# Patient Record
Sex: Male | Born: 1965
Health system: Southern US, Community
[De-identification: ages and names within clinical notes are randomized; demographics above are authoritative.]

## PROBLEM LIST (undated history)

## (undated) DIAGNOSIS — I639 Cerebral infarction, unspecified: Secondary | ICD-10-CM

## (undated) DIAGNOSIS — R011 Cardiac murmur, unspecified: Secondary | ICD-10-CM

## (undated) HISTORY — DX: Cardiac murmur, unspecified: R01.1

## (undated) HISTORY — DX: Cerebral infarction, unspecified: I63.9

---

## 2011-04-18 ENCOUNTER — Ambulatory Visit: Payer: Self-pay

## 2011-04-18 DIAGNOSIS — M25519 Pain in unspecified shoulder: Secondary | ICD-10-CM

## 2011-04-18 DIAGNOSIS — M7150 Other bursitis, not elsewhere classified, unspecified site: Secondary | ICD-10-CM

## 2015-01-26 ENCOUNTER — Encounter: Payer: Self-pay | Admitting: Gastroenterology

## 2015-01-26 ENCOUNTER — Ambulatory Visit (INDEPENDENT_AMBULATORY_CARE_PROVIDER_SITE_OTHER): Payer: BLUE CROSS/BLUE SHIELD | Admitting: Family Medicine

## 2015-01-26 VITALS — BP 118/84 | HR 98 | Temp 98.1°F | Resp 18 | Ht 70.0 in | Wt 207.0 lb

## 2015-01-26 DIAGNOSIS — J209 Acute bronchitis, unspecified: Secondary | ICD-10-CM

## 2015-01-26 DIAGNOSIS — R1310 Dysphagia, unspecified: Secondary | ICD-10-CM

## 2015-01-26 DIAGNOSIS — Z9189 Other specified personal risk factors, not elsewhere classified: Secondary | ICD-10-CM | POA: Diagnosis not present

## 2015-01-26 MED ORDER — OMEPRAZOLE 20 MG PO CPDR: 20.0000 mg | DELAYED_RELEASE_CAPSULE | Freq: Every day | ORAL | Status: DC

## 2015-01-26 MED ORDER — HYDROCODONE-HOMATROPINE 5-1.5 MG/5ML PO SYRP: 5.0000 mL | ORAL_SOLUTION | Freq: Three times a day (TID) | ORAL | Status: DC | PRN

## 2015-01-26 MED ORDER — AZITHROMYCIN 250 MG PO TABS: ORAL_TABLET | ORAL | Status: DC

## 2015-01-26 NOTE — Patient Instructions (Signed)
You absolutely need to get your teeth and better working order. 2 great dentists that you might consider would be [facility] and [facility]  Also, the food getting stuck in your esophagus, can become a very debilitating problem. I am referring him to a gastroenterologist to get this taken care of. In the meantime, take the Prilosec every day.

## 2015-01-26 NOTE — Progress Notes (Signed)
Subjective:  This chart was scribed for John SidleKurt Maddisyn Hegwood MD, by Veverly FellsHatice Demirci,scribe, at [facility].  This patient was seen in room 8 and the patient's care was started at 8:44 AM.   Chief Complaint  Patient presents with  . Cough    x3 days green mucous      Patient ID: John SidleDavid G Breon, male    DOB: October 23, 1965, 49 y.o.   MRN: [id number]  HPI HPI Comments: John SidleDavid G Swayze is a 49 y.o. male who presents to the [facility] complaining of a productive cough (green mucous) onset three days ago.  Patient states his symptoms first started with a sore throat five days ago. He denies any nausea/vomitting.  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 Patient does not smoke and does not have a history of drug abuse.   He is also complaining of difficulty swallowing recently with an acid burning sensation at times.  He denies any difficulty breathing or shortness of breath but states that it is worrisome for him at times when he is eating.    There are no active problems to display for this patient.  Past Medical History  Diagnosis Date  . Heart murmur    History reviewed. No pertinent past surgical history. No Known Allergies Prior to Admission medications   Not on File   Social History   Social History  . Marital Status: Single    Spouse Name: N/A  . Number of Children: N/A  . Years of Education: N/A   Occupational History  . Not on file.   Social History Main Topics  . Smoking status: Never Smoker   . Smokeless tobacco: Not on file  . Alcohol Use: 6.0 oz/week    10 Standard drinks or equivalent per week  . Drug Use: No  . Sexual Activity: Not on file   Other Topics Concern  . Not on file   Social History Narrative  . No narrative on file       Review of Systems  Constitutional: Negative for fever and chills.  HENT: Positive for trouble swallowing.     Eyes: Negative for pain, redness and itching.  Respiratory: Positive for cough. Negative for choking and shortness of breath.   Gastrointestinal: Negative for nausea and vomiting.  Musculoskeletal: Negative for neck pain and neck stiffness.  Neurological: Negative for syncope and speech difficulty.       Objective:   Physical Exam  Constitutional: He is oriented to person, place, and time. He appears well-developed and well-nourished. No distress.  HENT:  Head: Normocephalic and atraumatic.  Multiple cavities, gingivitis   Eyes: Pupils are equal, round, and reactive to light.  Neck: Normal range of motion.  Pulmonary/Chest:  Rhonchi bilaterally.   Neurological: He is alert and oriented to person, place, and time.  Skin: Skin is warm and dry.    Filed Vitals:   01/26/15 0841  BP: 118/84  Pulse: 98  Temp: 98.1 F (36.7 C)  TempSrc: Oral  Resp: 18  Height: 5\' 10"  (1.778 m)  Weight: 207 lb (93.895 kg)  SpO2: 99%         Assessment & Plan:   This chart was scribed in my presence and reviewed by me personally.    ICD-9-CM ICD-10-CM   1. Acute bronchitis, unspecified organism 466.0 J20.9 azithromycin (ZITHROMAX) 250 MG tablet  HYDROcodone-homatropine (HYCODAN) 5-1.5 MG/5ML syrup  2. Poor dental hygiene 525.8 Z91.89   3. Dysphagia 787.20 R13.10 omeprazole (PRILOSEC) 20 MG capsule     Ambulatory referral to Gastroenterology   I have stressed to the patient the importance of good dentistry and seeing a gastroenterologist for his dysphagia.  Signed, John Sidle, MD

## 2015-02-20 ENCOUNTER — Ambulatory Visit: Payer: Self-pay | Admitting: Gastroenterology

## 2017-11-12 ENCOUNTER — Other Ambulatory Visit: Payer: Self-pay

## 2017-11-12 ENCOUNTER — Inpatient Hospital Stay (HOSPITAL_COMMUNITY): Payer: BLUE CROSS/BLUE SHIELD

## 2017-11-12 ENCOUNTER — Emergency Department (HOSPITAL_COMMUNITY): Payer: BLUE CROSS/BLUE SHIELD

## 2017-11-12 ENCOUNTER — Encounter (HOSPITAL_COMMUNITY): Payer: Self-pay

## 2017-11-12 ENCOUNTER — Inpatient Hospital Stay (HOSPITAL_COMMUNITY)
Admission: EM | Admit: 2017-11-12 | Discharge: 2017-11-14 | DRG: 065 | Disposition: A | Discharge status: Rehabilitation Facility | Payer: BLUE CROSS/BLUE SHIELD | Attending: Internal Medicine | Admitting: Internal Medicine

## 2017-11-12 DIAGNOSIS — I6789 Other cerebrovascular disease: Secondary | ICD-10-CM

## 2017-11-12 DIAGNOSIS — E785 Hyperlipidemia, unspecified: Secondary | ICD-10-CM | POA: Diagnosis not present

## 2017-11-12 DIAGNOSIS — R739 Hyperglycemia, unspecified: Secondary | ICD-10-CM

## 2017-11-12 DIAGNOSIS — R42 Dizziness and giddiness: Secondary | ICD-10-CM

## 2017-11-12 DIAGNOSIS — Z6832 Body mass index (BMI) 32.0-32.9, adult: Secondary | ICD-10-CM

## 2017-11-12 DIAGNOSIS — R112 Nausea with vomiting, unspecified: Secondary | ICD-10-CM | POA: Diagnosis not present

## 2017-11-12 DIAGNOSIS — D72829 Elevated white blood cell count, unspecified: Secondary | ICD-10-CM | POA: Diagnosis not present

## 2017-11-12 DIAGNOSIS — Q211 Atrial septal defect: Secondary | ICD-10-CM

## 2017-11-12 DIAGNOSIS — I10 Essential (primary) hypertension: Secondary | ICD-10-CM | POA: Diagnosis not present

## 2017-11-12 DIAGNOSIS — R262 Difficulty in walking, not elsewhere classified: Secondary | ICD-10-CM | POA: Diagnosis present

## 2017-11-12 DIAGNOSIS — R61 Generalized hyperhidrosis: Secondary | ICD-10-CM | POA: Diagnosis not present

## 2017-11-12 DIAGNOSIS — R29704 NIHSS score 4: Secondary | ICD-10-CM | POA: Diagnosis present

## 2017-11-12 DIAGNOSIS — I739 Peripheral vascular disease, unspecified: Secondary | ICD-10-CM | POA: Diagnosis not present

## 2017-11-12 DIAGNOSIS — I69393 Ataxia following cerebral infarction: Secondary | ICD-10-CM | POA: Diagnosis not present

## 2017-11-12 DIAGNOSIS — E669 Obesity, unspecified: Secondary | ICD-10-CM | POA: Diagnosis present

## 2017-11-12 DIAGNOSIS — Z809 Family history of malignant neoplasm, unspecified: Secondary | ICD-10-CM | POA: Diagnosis not present

## 2017-11-12 DIAGNOSIS — I63442 Cerebral infarction due to embolism of left cerebellar artery: Principal | ICD-10-CM | POA: Diagnosis present

## 2017-11-12 DIAGNOSIS — D72820 Lymphocytosis (symptomatic): Secondary | ICD-10-CM | POA: Diagnosis not present

## 2017-11-12 DIAGNOSIS — I340 Nonrheumatic mitral (valve) insufficiency: Secondary | ICD-10-CM | POA: Diagnosis not present

## 2017-11-12 DIAGNOSIS — Z8249 Family history of ischemic heart disease and other diseases of the circulatory system: Secondary | ICD-10-CM | POA: Diagnosis not present

## 2017-11-12 DIAGNOSIS — K760 Fatty (change of) liver, not elsewhere classified: Secondary | ICD-10-CM | POA: Diagnosis present

## 2017-11-12 DIAGNOSIS — G119 Hereditary ataxia, unspecified: Secondary | ICD-10-CM | POA: Diagnosis not present

## 2017-11-12 DIAGNOSIS — Z7289 Other problems related to lifestyle: Secondary | ICD-10-CM

## 2017-11-12 DIAGNOSIS — I639 Cerebral infarction, unspecified: Secondary | ICD-10-CM | POA: Diagnosis present

## 2017-11-12 DIAGNOSIS — G464 Cerebellar stroke syndrome: Secondary | ICD-10-CM | POA: Diagnosis not present

## 2017-11-12 DIAGNOSIS — I63542 Cerebral infarction due to unspecified occlusion or stenosis of left cerebellar artery: Secondary | ICD-10-CM | POA: Diagnosis not present

## 2017-11-12 DIAGNOSIS — R110 Nausea: Secondary | ICD-10-CM | POA: Diagnosis not present

## 2017-11-12 DIAGNOSIS — R1111 Vomiting without nausea: Secondary | ICD-10-CM | POA: Diagnosis not present

## 2017-11-12 DIAGNOSIS — Z789 Other specified health status: Secondary | ICD-10-CM

## 2017-11-12 DIAGNOSIS — K5900 Constipation, unspecified: Secondary | ICD-10-CM | POA: Diagnosis not present

## 2017-11-12 LAB — COMPREHENSIVE METABOLIC PANEL
ALBUMIN: 4.2 g/dL (ref 3.5–5.0)
ALK PHOS: 43 U/L (ref 38–126)
ALT: 26 U/L (ref 0–44)
ANION GAP: 10 (ref 5–15)
AST: 25 U/L (ref 15–41)
BILIRUBIN TOTAL: 1.9 mg/dL — AB (ref 0.3–1.2)
BUN: 15 mg/dL (ref 6–20)
CALCIUM: 8.9 mg/dL (ref 8.9–10.3)
CO2: 24 mmol/L (ref 22–32)
Chloride: 104 mmol/L (ref 98–111)
Creatinine, Ser: 0.96 mg/dL (ref 0.61–1.24)
GFR calc Af Amer: >60 mL/min (ref >60)
GFR calc non Af Amer: >60 mL/min (ref >60)
GLUCOSE: 146 mg/dL — AB (ref 70–99)
Potassium: 3.8 mmol/L (ref 3.5–5.1)
SODIUM: 138 mmol/L (ref 135–145)
TOTAL PROTEIN: 7.2 g/dL (ref 6.5–8.1)

## 2017-11-12 LAB — RAPID URINE DRUG SCREEN, HOSP PERFORMED
AMPHETAMINES: NOT DETECTED
BARBITURATES: NOT DETECTED
Benzodiazepines: NOT DETECTED
Cocaine: NOT DETECTED
TETRAHYDROCANNABINOL: NOT DETECTED

## 2017-11-12 LAB — ETHANOL: Alcohol, Ethyl (B): <10 mg/dL (ref <10)

## 2017-11-12 LAB — PROTIME-INR
INR: 0.96
PROTHROMBIN TIME: 12.7 s (ref 11.4–15.2)

## 2017-11-12 LAB — DIFFERENTIAL
Basophils Absolute: 0 10*3/uL (ref 0.0–0.1)
Basophils Relative: 0 %
EOS PCT: 0 %
Eosinophils Absolute: 0 10*3/uL (ref 0.0–0.7)
LYMPHS ABS: 0.7 10*3/uL (ref 0.7–4.0)
LYMPHS PCT: 6 %
Monocytes Absolute: 0.4 10*3/uL (ref 0.1–1.0)
Monocytes Relative: 3 %
NEUTROS ABS: 9.7 10*3/uL — AB (ref 1.7–7.7)
NEUTROS PCT: 91 %

## 2017-11-12 LAB — URINALYSIS, ROUTINE W REFLEX MICROSCOPIC
BACTERIA UA: NONE SEEN
BILIRUBIN URINE: NEGATIVE
Glucose, UA: 50 mg/dL — AB
Hgb urine dipstick: NEGATIVE
KETONES UR: NEGATIVE mg/dL
LEUKOCYTES UA: NEGATIVE
NITRITE: NEGATIVE
Protein, ur: 30 mg/dL — AB
SPECIFIC GRAVITY, URINE: 1.038 — AB (ref 1.005–1.030)
pH: 8 (ref 5.0–8.0)

## 2017-11-12 LAB — I-STAT CHEM 8, ED
BUN: 14 mg/dL (ref 6–20)
CALCIUM ION: 1.1 mmol/L — AB (ref 1.15–1.40)
CHLORIDE: 103 mmol/L (ref 98–111)
CREATININE: 0.9 mg/dL (ref 0.61–1.24)
Glucose, Bld: 142 mg/dL — ABNORMAL HIGH (ref 70–99)
HCT: 42 % (ref 39.0–52.0)
Hemoglobin: 14.3 g/dL (ref 13.0–17.0)
Potassium: 3.8 mmol/L (ref 3.5–5.1)
Sodium: 138 mmol/L (ref 135–145)
TCO2: 21 mmol/L — AB (ref 22–32)

## 2017-11-12 LAB — APTT: aPTT: 27 seconds (ref 24–36)

## 2017-11-12 LAB — I-STAT TROPONIN, ED: Troponin i, poc: 0 ng/mL (ref 0.00–0.08)

## 2017-11-12 LAB — CBC
HCT: 42.4 % (ref 39.0–52.0)
HEMOGLOBIN: 14.9 g/dL (ref 13.0–17.0)
MCH: 28.5 pg (ref 26.0–34.0)
MCHC: 35.1 g/dL (ref 30.0–36.0)
MCV: 81.2 fL (ref 78.0–100.0)
PLATELETS: 280 10*3/uL (ref 150–400)
RBC: 5.22 MIL/uL (ref 4.22–5.81)
RDW: 12.9 % (ref 11.5–15.5)
WBC: 10.8 10*3/uL — AB (ref 4.0–10.5)

## 2017-11-12 LAB — ECHOCARDIOGRAM COMPLETE
Height: 70 in
Weight: 3600 oz

## 2017-11-12 MED ORDER — ASPIRIN 300 MG RE SUPP: 300.0000 mg | Freq: Every day | RECTAL | Status: DC

## 2017-11-12 MED ORDER — SENNOSIDES-DOCUSATE SODIUM 8.6-50 MG PO TABS: 1.0000 | ORAL_TABLET | Freq: Every evening | ORAL | Status: DC | PRN

## 2017-11-12 MED ORDER — MECLIZINE HCL 25 MG PO TABS
25.0000 mg | ORAL_TABLET | Freq: Once | ORAL | Status: AC
  Administered 2017-11-12: 25 mg via ORAL
  Filled 2017-11-12: qty 1

## 2017-11-12 MED ORDER — ONDANSETRON HCL 4 MG/2ML IJ SOLN
4.0000 mg | Freq: Four times a day (QID) | INTRAMUSCULAR | Status: DC | PRN
  Administered 2017-11-12 (×2): 4 mg via INTRAVENOUS
  Filled 2017-11-12 (×2): qty 2

## 2017-11-12 MED ORDER — ACETAMINOPHEN 325 MG PO TABS: 650.0000 mg | ORAL_TABLET | ORAL | Status: DC | PRN

## 2017-11-12 MED ORDER — METOCLOPRAMIDE HCL 5 MG/ML IJ SOLN
10.0000 mg | Freq: Once | INTRAMUSCULAR | Status: AC
  Administered 2017-11-12: 10 mg via INTRAVENOUS
  Filled 2017-11-12: qty 2

## 2017-11-12 MED ORDER — IOPAMIDOL (ISOVUE-370) INJECTION 76%
100.0000 mL | Freq: Once | INTRAVENOUS | Status: AC | PRN
  Administered 2017-11-12: 80 mL via INTRAVENOUS

## 2017-11-12 MED ORDER — ENOXAPARIN SODIUM 40 MG/0.4ML ~~LOC~~ SOLN
40.0000 mg | SUBCUTANEOUS | Status: DC
  Administered 2017-11-12 – 2017-11-14 (×3): 40 mg via SUBCUTANEOUS
  Filled 2017-11-12 (×3): qty 0.4

## 2017-11-12 MED ORDER — SODIUM CHLORIDE 0.9 % IV SOLN
INTRAVENOUS | Status: AC
  Administered 2017-11-12: 12:00:00 via INTRAVENOUS

## 2017-11-12 MED ORDER — IOPAMIDOL (ISOVUE-370) INJECTION 76%
INTRAVENOUS | Status: AC
  Filled 2017-11-12: qty 100

## 2017-11-12 MED ORDER — STROKE: EARLY STAGES OF RECOVERY BOOK
Freq: Once | Status: AC
  Administered 2017-11-12: 14:00:00

## 2017-11-12 MED ORDER — ACETAMINOPHEN 650 MG RE SUPP: 650.0000 mg | RECTAL | Status: DC | PRN

## 2017-11-12 MED ORDER — ACETAMINOPHEN 160 MG/5ML PO SOLN: 650.0000 mg | ORAL | Status: DC | PRN

## 2017-11-12 MED ORDER — ASPIRIN 325 MG PO TABS
325.0000 mg | ORAL_TABLET | Freq: Every day | ORAL | Status: DC
  Administered 2017-11-12 – 2017-11-13 (×2): 325 mg via ORAL
  Filled 2017-11-12 (×2): qty 1

## 2017-11-12 NOTE — ED Provider Notes (Signed)
7:39 AM [facility] at shift change.   MRI demonstrates acute CVA.  Stroke order set pending.  I have spoken with Dr. Wilford CornerArora.  He requests that we go ahead and obtain CT angiography of the head and neck.  [facility] of [facility] team [facility].  Patient updated on results.  He is aware of the need for admission.  Symptoms stable.  No gross neurological deficits on exam.  BP 118/74 (BP Location: Right Arm)   Pulse 90   Temp 98 F (36.7 C) (Oral)   Resp 16   Ht 5\' 10"  (1.778 m)   Wt 102.1 kg   SpO2 95%   BMI 32.28 kg/m   8:21 AM Spoke with Dr. Marland McalpineSheikh who will see patient.   BP 123/79   Pulse 80   Temp 98 F (36.7 C) (Oral)   Resp 20   Ht 5\' 10"  (1.778 m)   Wt 102.1 kg   SpO2 99%   BMI 32.28 kg/m     Renne CriglerGeiple, Luisdavid Hamblin, PA-C 11/12/17 [id number]    Pricilla LovelessGoldston, Scott, MD 11/12/17 [phone]

## 2017-11-12 NOTE — ED Notes (Signed)
Patient aware urine sample is needed.  

## 2017-11-12 NOTE — Progress Notes (Signed)
*  PRELIMINARY RESULTS* Echocardiogram 2D Echocardiogram has been performed.  John Mcbride, [name] 11/12/2017, 1:27 PM

## 2017-11-12 NOTE — Progress Notes (Signed)
SLP Cancellation Note  Patient Details Name: John Mcbride MRN: [id number] DOB: 02/22/1966   Cancelled treatment:       Reason Eval/Treat Not Completed: Patient at procedure or test/unavailable. Unable to complete SLE at this time, as pt is off unit for testing. Will continue efforts.   John Mcbride, [facility] Hospital[facility]P, CCC-SLP Speech Language Pathologist [phone]  John Mcbride, John Mcbride 11/12/2017, 4:00 PM

## 2017-11-12 NOTE — Consult Note (Addendum)
Neurology Consultation  Reason for Consult: Left cerebellar infarct Referring Physician: [facility]  CC: Dizziness, nausea vomiting with inability to walk  History is obtained from: Patient and chart  HPI: John Mcbride is a 52 y.o. male with history of heart murmur and previous stroke.  Patient states that last night at approximately 11:00 she went to sleep.  He woke up at 12:00 and noticed he felt dizzy.  No vertigo, no numbness, no blurred vision, no double vision, no weakness.  Patient got up to go to the bathroom and noticed his dizziness could not allow him to walk.  He had to crawl to a chair.  He continued to feel dizzy.  States he did throw up once while at home and then multiple times when he was in the EMS vehicle.  While in the EMS truck he was given Zofran and nausea vomiting.  Patient was brought to [place] long and then transferred to [facility].  Currently he still feels dizzy and off-balance when he is walking but again states he does not have any vertigo.  He states that he does not take any Plavix nor does he take aspirin on a daily basis.  He denies drinking or smoking.   LKW: 2300 hrs. on 11/12/2017 tpa given?: no, out of window Premorbid modified Rankin scale (mRS): 0 NIHSS 4   ROS:ROS was performed and is negative except as noted in the HPI.  Past Medical History:  Diagnosis Date  . Heart murmur      Family History  Problem Relation Age of Onset  . Cancer Mother   . Cancer Father   . Heart disease Father      Social History:   reports that he has never smoked. He has never used smokeless tobacco. He reports that he drinks about 10.0 standard drinks of alcohol per week. He reports that he does not use drugs.  Medications  Current Facility-Administered Medications:  .   stroke: mapping our early stages of recovery book, , Does not apply, Once, Sheikh, Omair Latif, DO .  0.9 %  sodium chloride infusion, , Intravenous, Continuous, Sheikh, Omair Latif, DO .   acetaminophen (TYLENOL) tablet 650 mg, 650 mg, Oral, Q4H PRN **OR** acetaminophen (TYLENOL) solution 650 mg, 650 mg, Per Tube, Q4H PRN **OR** acetaminophen (TYLENOL) suppository 650 mg, 650 mg, Rectal, Q4H PRN, Sheikh, Omair Latif, DO .  aspirin suppository 300 mg, 300 mg, Rectal, Daily **OR** aspirin tablet 325 mg, 325 mg, Oral, Daily, Sheikh, Omair Latif, DO .  enoxaparin (LOVENOX) injection 40 mg, 40 mg, Subcutaneous, Q24H, Sheikh, Omair Latif, DO .  iopamidol (ISOVUE-370) 76 % injection, , , ,  .  ondansetron (ZOFRAN) injection 4 mg, 4 mg, Intravenous, Q6H PRN, Sheikh, Omair Latif, DO, 4 mg at 11/12/17 1010 .  senna-docusate (Senokot-S) tablet 1 tablet, 1 tablet, Oral, QHS PRN, Merlene Laughter, DO   Exam: Current vital signs: BP 118/81 (BP Location: Left Arm)   Pulse 68   Temp 98.1 F (36.7 C) (Oral)   Resp 16   Ht 5\' 10"  (1.778 m)   Wt 102.1 kg   SpO2 99%   BMI 32.28 kg/m  Vital signs in last 24 hours: Temp:  [98 F (36.7 C)-98.1 F (36.7 C)] 98.1 F (36.7 C) (08/14 1054) Pulse Rate:  [66-90] 68 (08/14 1054) Resp:  [16-22] 16 (08/14 1054) BP: (118-136)/(69-88) 118/81 (08/14 1054) SpO2:  [94 %-100 %] 99 % (08/14 1054) Weight:  [102.1 kg] 102.1 kg (08/14 0243)  GENERAL: Awake, alert in NAD Lungs: Clear Ext: warm, well perfused, intact peripheral pulses  NEURO:  Mental Status: AA&Ox3, speech appears dysarthric.  Naming, repetition, fluency, and comprehension intact. Cranial Nerves: PERRL 2 mm/brisk. EOMI, visual fields full, saccadic eye movements with vertical ocular nystagmus/bobbing, no facial asymmetry, facial sensation intact, hearing intact, tongue/uvula/soft palate midline, Motor: 5/5 throughout Tone: is normal and bulk is normal Sensation- Intact to light touch bilaterally Coordination: Finger-to-nose was dysmetric on the left, heel-to-shin was slightly dysmetric on the left Gait- deferred  Labs I have reviewed labs in epic and the results pertinent to this  consultation are:   CBC    Component Value Date/Time   WBC 10.8 (H) 11/12/2017 0728   RBC 5.22 11/12/2017 0728   HGB 14.3 11/12/2017 0738   HCT 42.0 11/12/2017 0738   PLT 280 11/12/2017 0728   MCV 81.2 11/12/2017 0728   MCH 28.5 11/12/2017 0728   MCHC 35.1 11/12/2017 0728   RDW 12.9 11/12/2017 0728   LYMPHSABS 0.7 11/12/2017 0728   MONOABS 0.4 11/12/2017 0728   EOSABS 0.0 11/12/2017 0728   BASOSABS 0.0 11/12/2017 0728    CMP     Component Value Date/Time   NA 138 11/12/2017 0738   K 3.8 11/12/2017 0738   CL 103 11/12/2017 0738   CO2 24 11/12/2017 0728   GLUCOSE 142 (H) 11/12/2017 0738   BUN 14 11/12/2017 0738   CREATININE 0.90 11/12/2017 0738   CALCIUM 8.9 11/12/2017 0728   PROT 7.2 11/12/2017 0728   ALBUMIN 4.2 11/12/2017 0728   AST 25 11/12/2017 0728   ALT 26 11/12/2017 0728   ALKPHOS 43 11/12/2017 0728   BILITOT 1.9 (H) 11/12/2017 0728   GFRNONAA >60 11/12/2017 0728   GFRAA >60 11/12/2017 0728    Lipid Panel  No results found for: CHOL, TRIG, HDL, CHOLHDL, VLDL, LDLCALC, LDLDIRECT   Imaging I have reviewed the images obtained: CT-scan of the brain: Suggestion of asymmetrical low-attenuation and possible mass effect in the left anterior cerebellum and cerebellar peduncle. MRI is recommended for further evaluation. No acute intracranial hemorrhage or mass effect.  CTA of head neck: IMPRESSION: The patient does not show evidence of atherosclerotic vascular disease in general or of dissection. 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 MRI examination of the brain  MRI Brain: IMPRESSION: 3.5 cm in diameter acute infarction in the superior cerebellum on the left. No evidence of significant swelling/mass effect or of any hemorrhage. Mild chronic small-vessel ischemic change of the cerebral hemispheric white matter. No large vessel vascular occlusion identified on this noncontrast study.   Assessment:  Left cerebellar infarct acute/subacute due to left SCA occlusion. There is no cerebral edema or shift at this time.   This point etiology unclear and under investigation.  Recommend #Check repeat CT of head at 9:00 PM for any evidence of hydrocephalus #Consider transfer to stepdown if mentation worsens or exam deteriorates. Consider stat CTH sooner than 9pm should that happen. #Transthoracic Echo, may need TEE and loop monitor  # Start patient on ASA 325mg  daily,   #Start or continue Atorvastatin 80 mg/other high intensity statin # BP goal: permissive HTN upto 220/120 mmHg # HBAIC and Lipid profile # Telemetry monitoring # Frequent neuro checks # NPO until passes stroke swallow screen   # please page stroke NP  Or  PA  Or MD from 8am -4 pm  as this patient from this time will be  followed by  the stroke.   You can look them up on [email or web address]  Password [email or web address]  Attending Neurohospitalist Addendum Patient seen and examined with APP/Resident. Agree with the history and physical as documented above. Agree with the plan as documented, which I helped formulate. I have independently reviewed the chart, obtained history, review of systems and examined the patient.I have personally reviewed pertinent head/neck/spine imaging (CT/MRI). Please feel free to call with any questions. --- Milon DikesAshish Kaleena Corrow, MD [facility] Pager: [phone]  If 7pm to 7am, please call on call as listed on AMION.

## 2017-11-12 NOTE — ED Notes (Signed)
Hospitalist at bedside 

## 2017-11-12 NOTE — ED Notes (Signed)
Patient transported to CT 

## 2017-11-12 NOTE — ED Provider Notes (Signed)
[facility]-EMERGENCY DEPT Provider Note   CSN: [id number] Arrival date & time: 11/12/17  0218     History   Chief Complaint Chief Complaint  Patient presents with  . Dizziness  . Nausea    HPI John Mcbride is a 52 y.o. male.  52 y/o male with hx of heart murmur presents to the ED for c/o dizziness. Patient was awoken from sleep by a dream tonight.  At this time, he began to notice persistent dizziness.  States the sensation felt similar to being on a boat.  Was unable to ambulate secondary to worsening symptoms, requiring him to crawl on his hands and knees.  Symptoms associated with nausea as well as vomiting.  Had a few episodes of emesis with EMS.  Notes that nausea is slightly improved since receiving Zofran.  Denies any headache, tinnitus, hearing loss, vision changes, difficulty speaking or swallowing, extremity numbness or paresthesias, extremity weakness.  Got a small amount of hydraulic fluid in his left ear canal yesterday while at work.  Is unsure if this may be contributing to his symptoms.  Denies prior history of vertigo.  No known history of hypertension, dyslipidemia, diabetes.  No recent head injury or trauma.  The patient is not a smoker.  Hx of heavy ETOH use, approx 4-6 beers 4 or 5 times/wk, but has been drinking less lately. Last consumed ETOH on Thursday.  The history is provided by the patient. No language interpreter was used.  Dizziness    Past Medical History:  Diagnosis Date  . Heart murmur     There are no active problems to display for this patient.   History reviewed. No pertinent surgical history.      Home Medications    Prior to Admission medications   Medication Sig Start Date End Date Taking? Authorizing Provider  azithromycin (ZITHROMAX) 250 MG tablet Take 2 tabs PO x 1 dose, then 1 tab PO QD x 4 days Patient not taking: Reported on 11/12/2017 01/26/15   John SidleLauenstein, Kurt, MD  HYDROcodone-homatropine [facility](HYCODAN)  5-1.5 MG/5ML syrup Take 5 mLs by mouth every 8 (eight) hours as needed for cough. Patient not taking: Reported on 11/12/2017 01/26/15   John SidleLauenstein, Kurt, MD  omeprazole (PRILOSEC) 20 MG capsule Take 1 capsule (20 mg total) by mouth daily. Patient not taking: Reported on 11/12/2017 01/26/15   John SidleLauenstein, Kurt, MD    Family History Family History  Problem Relation Age of Onset  . Cancer Mother   . Cancer Father   . Heart disease Father     Social History Social History   Tobacco Use  . Smoking status: Never Smoker  . Smokeless tobacco: Never Used  Substance Use Topics  . Alcohol use: Yes    Alcohol/week: 10.0 standard drinks    Types: 10 Standard drinks or equivalent per week  . Drug use: No     Allergies   Patient has no known allergies.   Review of Systems Review of Systems  Neurological: Positive for dizziness.  Ten systems reviewed and are negative for acute change, except as noted in the HPI.    Physical Exam Updated Vital Signs BP 118/74 (BP Location: Right Arm)   Pulse 90   Temp 98 F (36.7 C) (Oral)   Resp 16   Ht 5\' 10"  (1.778 m)   Wt 102.1 kg   SpO2 95%   BMI 32.28 kg/m   Physical Exam  Constitutional: He is oriented to person, place, and time. He  appears well-developed and well-nourished. No distress.  Nontoxic appearing and in NAD. Seems uncomfortable.  HENT:  Head: Normocephalic and atraumatic.  Right Ear: Tympanic membrane, external ear and ear canal normal.  Left Ear: Tympanic membrane, external ear and ear canal normal.  Eyes: Pupils are equal, round, and reactive to light. Conjunctivae and EOM are normal. No scleral icterus.  No appreciable nystagmus  Neck: Normal range of motion.  No meningismus  Cardiovascular: Normal rate, regular rhythm and intact distal pulses.  Pulmonary/Chest: Effort normal. No stridor. No respiratory distress.  Respirations even and unlabored  Musculoskeletal: Normal range of motion.  Neurological: He is alert and  oriented to person, place, and time. No cranial nerve deficit. He exhibits normal muscle tone. Coordination normal.  GCS 15. Speech is goal oriented. No cranial nerve deficits appreciated; symmetric eyebrow raise, no facial drooping, tongue midline. Patient has equal grip strength bilaterally with 5/5 strength against resistance in all major muscle groups bilaterally. Sensation to light touch intact. Patient moves extremities without ataxia.  Skin: Skin is warm and dry. No rash noted. He is not diaphoretic. No erythema. No pallor.  Psychiatric: He has a normal mood and affect. His behavior is normal.  Nursing note and vitals reviewed.    ED Treatments / Results  Labs (all labs ordered are listed, but only abnormal results are displayed) Labs Reviewed - No data to display  EKG None  Radiology Ct Head Wo Contrast  Result Date: 11/12/2017 CLINICAL DATA:  Dizziness. Nausea and vomiting. Hydraulic fluid in the ear at work. EXAM: CT HEAD WITHOUT CONTRAST TECHNIQUE: Contiguous axial images were obtained from the base of the skull through the vertex without intravenous contrast. COMPARISON:  None. FINDINGS: Brain: There is suggestion of asymmetrical low-attenuation and possible mass effect in the left anterior cerebellum and cerebellar peduncle. This could represent an area of acute infarct or possibly a mass lesion. MRI is suggested for further evaluation. Intracranial contents are otherwise unremarkable. No ventricular dilatation. No abnormal extra-axial fluid collections. Gray-white matter junctions are distinct. Basal cisterns are not effaced. No acute intracranial hemorrhage. Vascular: No hyperdense vessel or unexpected calcification. Skull: Normal. Negative for fracture or focal lesion. Sinuses/Orbits: Mild mucosal thickening in the paranasal sinuses. No acute air-fluid levels. Mastoid air cells and middle ear cavities are clear. Other: None. IMPRESSION: Suggestion of asymmetrical low-attenuation  and possible mass effect in the left anterior cerebellum and cerebellar peduncle. MRI is recommended for further evaluation. No acute intracranial hemorrhage or mass effect. Electronically Signed   By: Burman NievesWilliam  Stevens M.D.   On: 11/12/2017 05:42    Procedures Procedures (including critical care time)  Medications Ordered in ED Medications  metoCLOPramide (REGLAN) injection 10 mg (10 mg Intravenous Given 11/12/17 0311)  meclizine (ANTIVERT) tablet 25 mg (25 mg Oral Given 11/12/17 0311)     Initial Impression / Assessment and Plan / ED Course  I have reviewed the triage vital signs and the nursing notes.  Pertinent labs & imaging results that were available during my care of the patient were reviewed by me and considered in my medical decision making (see chart for details).     753:4310 AM 52 year old male with no significant past medical history presents to the emergency department for evaluation of vertigo.  Patient noticed symptoms after waking from a dream.  Initially had some nausea and vomiting.  This has improved with antiemetics.  No history of head injury or trauma.  Neurologic exam is largely reassuring.  We will continue to medically  manage and reassess.  4:35 AM Patient reassessed.  States that nausea has improved.  He notes persistent dizziness aggravated with position change.  Given persistent symptoms despite medications, will obtain CT imaging.  5:53 AM CT imaging today is abnormal and concerning for possible mass-effect in the left anterior cerebellum and cerebellar peduncle.  This may represent acute infarct or mass lesion.  No evidence of acute hemorrhage.  MRI recommended.  Will order.  Patient signed out to Rhea Bleacher, PA-C at shift change who will follow up on imaging and disposition appropriately.   Final Clinical Impressions(s) / ED Diagnoses   Final diagnoses:  Vertigo    ED Discharge Orders    None       Antony Madura, PA-C 11/12/17 0555    Palumbo,  April, MD 11/12/17 [phone]

## 2017-11-12 NOTE — ED Notes (Signed)
Patient states they do not feel like walking right now because they are still dizzy.

## 2017-11-12 NOTE — ED Notes (Signed)
ED TO INPATIENT HANDOFF REPORT  Name/Age/Gender John Mcbride 52 y.o. male  Code Status   Home/SNF/Other Home  Chief Complaint Dizziness; Nausea  Level of Care/Admitting Diagnosis ED Disposition    ED Disposition Condition [facility] Area: [place] [[id number]]  Level of Care: Telemetry [5]  Diagnosis: Cerebellar stroke [facility]) [[id number]]  Admitting Physician: Raiford Noble LATIF [[id number]]  Attending Physician: Raiford Noble LATIF [[id number]]  Estimated length of stay: past midnight tomorrow  Certification:: I certify this patient will need inpatient services for at least 2 midnights  PT Class (Do Not Modify): Inpatient [101]  PT Acc Code (Do Not Modify): Private [1]       Medical History Past Medical History:  Diagnosis Date  . Heart murmur     Allergies No Known Allergies  IV Location/Drains/Wounds Patient Lines/Drains/Airways Status   Active Line/Drains/Airways    Name:   Placement date:   Placement time:   Site:   Days:   Peripheral IV 11/12/17 Left Forearm   11/12/17    0241    Forearm   less than 1          Labs/Imaging Results for orders placed or performed during the hospital encounter of 11/12/17 (from the past 48 hour(s))  Ethanol     Status: None   Collection Time: 11/12/17  7:28 AM  Result Value Ref Range   Alcohol, Ethyl (B) <10 <10 mg/dL    Comment: (NOTE) Lowest detectable limit for serum alcohol is 10 mg/dL. For medical purposes only. Performed at [facility], [place] [address] [place], [place] [address]   Protime-INR     Status: None   Collection Time: 11/12/17  7:28 AM  Result Value Ref Range   Prothrombin Time 12.7 11.4 - 15.2 seconds   INR 0.96     Comment: Performed at [facility], [place] [address], [place], [place] [address]  APTT     Status: None   Collection Time: 11/12/17  7:28 AM  Result Value Ref Range   aPTT 27 24 - 36 seconds    Comment: Performed at  [facility], [place] [address], [place], [place] [address]  CBC     Status: Abnormal   Collection Time: 11/12/17  7:28 AM  Result Value Ref Range   WBC 10.8 (H) 4.0 - 10.5 K/uL   RBC 5.22 4.22 - 5.81 MIL/uL   Hemoglobin 14.9 13.0 - 17.0 g/dL   HCT 42.4 39.0 - 52.0 %   MCV 81.2 78.0 - 100.0 fL   MCH 28.5 26.0 - 34.0 pg   MCHC 35.1 30.0 - 36.0 g/dL   RDW 12.9 11.5 - 15.5 %   Platelets 280 150 - 400 K/uL    Comment: Performed at [facility], [place] [address], [place], [place] [address]  Differential     Status: Abnormal   Collection Time: 11/12/17  7:28 AM  Result Value Ref Range   Neutrophils Relative % 91 %   Neutro Abs 9.7 (H) 1.7 - 7.7 K/uL   Lymphocytes Relative 6 %   Lymphs Abs 0.7 0.7 - 4.0 K/uL   Monocytes Relative 3 %   Monocytes Absolute 0.4 0.1 - 1.0 K/uL   Eosinophils Relative 0 %   Eosinophils Absolute 0.0 0.0 - 0.7 K/uL   Basophils Relative 0 %   Basophils Absolute 0.0 0.0 - 0.1 K/uL    Comment: Performed at [facility], [address] [place], [place] [address]  Comprehensive metabolic panel     Status: Abnormal   Collection Time: 11/12/17  7:28 AM  Result Value Ref Range   Sodium 138 135 - 145 mmol/L   Potassium 3.8 3.5 - 5.1 mmol/L   Chloride 104 98 - 111 mmol/L   CO2 24 22 - 32 mmol/L   Glucose, Bld 146 (H) 70 - 99 mg/dL   BUN 15 6 - 20 mg/dL   Creatinine, Ser 0.96 0.61 - 1.24 mg/dL   Calcium 8.9 8.9 - 10.3 mg/dL   Total Protein 7.2 6.5 - 8.1 g/dL   Albumin 4.2 3.5 - 5.0 g/dL   AST 25 15 - 41 U/L   ALT 26 0 - 44 U/L   Alkaline Phosphatase 43 38 - 126 U/L   Total Bilirubin 1.9 (H) 0.3 - 1.2 mg/dL   GFR calc non Af Amer >60 >60 mL/min   GFR calc Af Amer >60 >60 mL/min    Comment: (NOTE) The eGFR has been calculated using the CKD EPI equation. This calculation has not been validated in all clinical situations. eGFR's persistently <60 mL/min signify possible Chronic Kidney Disease.    Anion gap 10  5 - 15    Comment: Performed at [facility], [place] [address] [place], [place] [address]  I-stat troponin, ED     Status: None   Collection Time: 11/12/17  7:36 AM  Result Value Ref Range   Troponin i, poc 0.00 0.00 - 0.08 ng/mL   Comment 3            Comment: Due to the release kinetics of cTnI, a negative result within the first hours of the onset of symptoms does not rule out myocardial infarction with certainty. If myocardial infarction is still suspected, repeat the test at appropriate intervals.   I-Stat Chem 8, ED     Status: Abnormal   Collection Time: 11/12/17  7:38 AM  Result Value Ref Range   Sodium 138 135 - 145 mmol/L   Potassium 3.8 3.5 - 5.1 mmol/L   Chloride 103 98 - 111 mmol/L   BUN 14 6 - 20 mg/dL   Creatinine, Ser 0.90 0.61 - 1.24 mg/dL   Glucose, Bld 142 (H) 70 - 99 mg/dL   Calcium, Ion 1.10 (L) 1.15 - 1.40 mmol/L   TCO2 21 (L) 22 - 32 mmol/L   Hemoglobin 14.3 13.0 - 17.0 g/dL   HCT 42.0 39.0 - 52.0 %   Ct Angio Head W Or Wo Contrast  Result Date: 11/12/2017 CLINICAL DATA:  Acute left cerebellar infarction. Dizziness, nausea and vomiting. EXAM: CT ANGIOGRAPHY HEAD AND NECK TECHNIQUE: Multidetector CT imaging of the head and neck was performed using the standard protocol during bolus administration of intravenous contrast. Multiplanar CT image reconstructions and MIPs were obtained to evaluate the vascular anatomy. Carotid stenosis measurements (when applicable) are obtained utilizing NASCET criteria, using the distal internal carotid diameter as the denominator. CONTRAST:  55m ISOVUE-370 IOPAMIDOL (ISOVUE-370) INJECTION 76% COMPARISON:  CT and MR study same day. FINDINGS: CTA NECK FINDINGS Aortic arch: Normal Right carotid system: Common carotid artery widely patent to the bifurcation. The carotid bifurcation is normal without soft or calcified plaque. Cervical internal carotid artery is tortuous but widely patent. Left carotid system: Common  carotid artery widely patent to the bifurcation. Carotid bifurcation is normal without soft or calcified plaque. Cervical ICA is tortuous but widely patent. Vertebral arteries: Both subclavian arteries appear normal. Both vertebral  artery origins are widely patent. The vertebral arteries are approximately equal in size an widely patent through the cervical region to the foramen magnum. Skeleton: Ordinary cervical spondylosis. Other neck: No mass or lymphadenopathy. Upper chest: Normal Review of the MIP images confirms the above findings CTA HEAD FINDINGS Anterior circulation: Both internal carotid arteries are widely patent through the skull base and siphon regions. The anterior and middle cerebral vessels are patent without proximal stenosis, aneurysm or vascular malformation. Posterior circulation: Both vertebral arteries are widely patent at and through the foramen magnum to the basilar. No basilar stenosis. There are diminutive posterior inferior cerebellar arteries. There are bilateral anterior inferior cerebellar arteries which are patent. Right superior cerebellar artery is normal. Left superior cerebellar artery is occluded just beyond its origin. Both posterior inferior cerebellar arteries are patent and normal. Venous sinuses: Patent and normal. Anatomic variants: None significant. Delayed phase: No abnormal enhancement. Review of the MIP images confirms the above findings IMPRESSION: The patient does not show evidence of atherosclerotic vascular disease in general or of dissection. 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 Electronically Signed   By: Nelson Chimes M.D.   On: 11/12/2017 08:58   Ct Head Wo Contrast  Result Date: 11/12/2017 CLINICAL DATA:  Dizziness. Nausea and vomiting. Hydraulic fluid in the ear at work. EXAM: CT HEAD WITHOUT CONTRAST TECHNIQUE: Contiguous axial images were obtained from the base of the skull through the vertex without  intravenous contrast. COMPARISON:  None. FINDINGS: Brain: There is suggestion of asymmetrical low-attenuation and possible mass effect in the left anterior cerebellum and cerebellar peduncle. This could represent an area of acute infarct or possibly a mass lesion. MRI is suggested for further evaluation. Intracranial contents are otherwise unremarkable. No ventricular dilatation. No abnormal extra-axial fluid collections. Gray-white matter junctions are distinct. Basal cisterns are not effaced. No acute intracranial hemorrhage. Vascular: No hyperdense vessel or unexpected calcification. Skull: Normal. Negative for fracture or focal lesion. Sinuses/Orbits: Mild mucosal thickening in the paranasal sinuses. No acute air-fluid levels. Mastoid air cells and middle ear cavities are clear. Other: None. IMPRESSION: Suggestion of asymmetrical low-attenuation and possible mass effect in the left anterior cerebellum and cerebellar peduncle. MRI is recommended for further evaluation. No acute intracranial hemorrhage or mass effect. Electronically Signed   By: Lucienne Capers M.D.   On: 11/12/2017 05:42   Ct Angio Neck W And/or Wo Contrast  Result Date: 11/12/2017 CLINICAL DATA:  Acute left cerebellar infarction. Dizziness, nausea and vomiting. EXAM: CT ANGIOGRAPHY HEAD AND NECK TECHNIQUE: Multidetector CT imaging of the head and neck was performed using the standard protocol during bolus administration of intravenous contrast. Multiplanar CT image reconstructions and MIPs were obtained to evaluate the vascular anatomy. Carotid stenosis measurements (when applicable) are obtained utilizing NASCET criteria, using the distal internal carotid diameter as the denominator. CONTRAST:  35m ISOVUE-370 IOPAMIDOL (ISOVUE-370) INJECTION 76% COMPARISON:  CT and MR study same day. FINDINGS: CTA NECK FINDINGS Aortic arch: Normal Right carotid system: Common carotid artery widely patent to the bifurcation. The carotid bifurcation is  normal without soft or calcified plaque. Cervical internal carotid artery is tortuous but widely patent. Left carotid system: Common carotid artery widely patent to the bifurcation. Carotid bifurcation is normal without soft or calcified plaque. Cervical ICA is tortuous but widely patent. Vertebral arteries: Both subclavian arteries appear normal. Both vertebral artery origins are widely patent. The vertebral arteries are approximately equal in size an widely patent through the cervical  region to the foramen magnum. Skeleton: Ordinary cervical spondylosis. Other neck: No mass or lymphadenopathy. Upper chest: Normal Review of the MIP images confirms the above findings CTA HEAD FINDINGS Anterior circulation: Both internal carotid arteries are widely patent through the skull base and siphon regions. The anterior and middle cerebral vessels are patent without proximal stenosis, aneurysm or vascular malformation. Posterior circulation: Both vertebral arteries are widely patent at and through the foramen magnum to the basilar. No basilar stenosis. There are diminutive posterior inferior cerebellar arteries. There are bilateral anterior inferior cerebellar arteries which are patent. Right superior cerebellar artery is normal. Left superior cerebellar artery is occluded just beyond its origin. Both posterior inferior cerebellar arteries are patent and normal. Venous sinuses: Patent and normal. Anatomic variants: None significant. Delayed phase: No abnormal enhancement. Review of the MIP images confirms the above findings IMPRESSION: The patient does not show evidence of atherosclerotic vascular disease in general or of dissection. 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 Electronically Signed   By: Nelson Chimes M.D.   On: 11/12/2017 08:58   Mr Brain Wo Contrast  Result Date: 11/12/2017 CLINICAL DATA:  Acute presentation with dizziness, nausea and vomiting. Hydraulic  fluid in the ear at work. EXAM: MRI HEAD WITHOUT CONTRAST TECHNIQUE: Multiplanar, multiecho pulse sequences of the brain and surrounding structures were obtained without intravenous contrast. COMPARISON:  CT same day FINDINGS: Brain: 3.5 cm in diameter acute infarction affecting the superior cerebellum on the left. No other acute infarction. No significant mass effect. No sign of hemorrhage. No abnormal finding affecting the brainstem. Cerebral hemispheres show scattered foci of T2 and FLAIR signal in the deep white matter consistent with early small vessel ischemic change of a chronic nature. No mass lesion, hydrocephalus or extra-axial collection. Vascular: Major vessels at the base of the brain show flow. Skull and upper cervical spine: Normal Sinuses/Orbits: Minimal mucosal inflammatory changes of the left maxillary sinus. Other: None IMPRESSION: 3.5 cm in diameter acute infarction in the superior cerebellum on the left. No evidence of significant swelling/mass effect or of any hemorrhage. Mild chronic small-vessel ischemic change of the cerebral hemispheric white matter. No large vessel vascular occlusion identified on this noncontrast study. Electronically Signed   By: Nelson Chimes M.D.   On: 11/12/2017 07:09    Pending Labs Unresulted Labs (From admission, onward)    Start     Ordered   11/12/17 1324  Urine rapid drug screen (hosp performed)  STAT,   STAT     11/12/17 0657   11/12/17 0658  Urinalysis, Routine w reflex microscopic  STAT,   STAT     11/12/17 0657   Signed and Held  HIV antibody (Routine Testing)  Once,   R     Signed and Held   Signed and Held  Hemoglobin A1c  Tomorrow morning,   R     Signed and Held   Signed and Held  Lipid panel  Tomorrow morning,   R    Comments:  Fasting    Signed and Held   Signed and Held  Creatinine, serum  (enoxaparin (LOVENOX)    CrCl >/= 30 ml/min)  Weekly,   R    Comments:  while on enoxaparin therapy    Signed and Held           Vitals/Pain Today's Vitals   11/12/17 0800 11/12/17 0915 11/12/17 0923 11/12/17 0925  BP: 119/70  123/69   Pulse: 69 82  87 85  Resp: _0 Temp:      TempSrc:      SpO2: 96% 98% 94% 100%  Weight:      Height:      PainSc:        Isolation Precautions No active isolations  Medications Medications  iopamidol (ISOVUE-370) 76 % injection (has no administration in time range)  ondansetron (ZOFRAN) injection 4 mg (has no administration in time range)  metoCLOPramide (REGLAN) injection 10 mg (10 mg Intravenous Given 11/12/17 0311)  meclizine (ANTIVERT) tablet 25 mg (25 mg Oral Given 11/12/17 0311)  iopamidol (ISOVUE-370) 76 % injection 100 mL (80 mLs Intravenous Contrast Given 11/12/17 0818)    Mobility walks with person assist

## 2017-11-12 NOTE — Progress Notes (Signed)
.  Rehab Admissions Coordinator Note:  Per PT and OT recommendation, Patient was screened by John Mcbride for appropriateness for an Inpatient Acute Rehab Consult.  At this time, we are recommending Inpatient Rehab consult. AC will contact MD for IP Rehab Consult Order.   John Mcbride 11/12/2017, 2:41 PM  I can be reached at [phone].

## 2017-11-12 NOTE — ED Notes (Signed)
Phone report called to WaverlyShaun, [facility] fundraiserN at [facility].

## 2017-11-12 NOTE — ED Triage Notes (Addendum)
Pt was brought by [facility] due to complaints of dizziness and N/V. Pt had a few emesis episodes with EMS. 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 Pt has also recently cut back from alcohol the past few weeks.   20 G L forearm.  Zofran 4 mg.

## 2017-11-12 NOTE — ED Notes (Signed)
[facility] called for transportation.

## 2017-11-12 NOTE — Consult Note (Signed)
Physical Medicine and Rehabilitation Consult Reason for Consult: Decreased functional mobility with dizziness Referring Physician: [facility]   HPI: John Mcbride is a 52 y.o. right-handed male with history of heart murmur, alcohol use on no prescription medications.  Patient lives with roommates.  Independent prior to admission.  Works Arts administrator.  One level home.  He does have parents in the area that can check on him as needed as well as good support of roommates.  Presented 11/12/2017 with nausea vomiting as well as dizziness and incoordination.  Cranial CT scan showed suggestion of asymmetrical low attenuation and possible mass-effect in the left anterior cerebellum and cerebellar peduncle.  MRI showed 3.5 cm diameter acute infarction in the superior cerebellum on the left.  No evidence of mass-effect or hemorrhage.  CT angiogram of head and neck with no large vessel occlusion.  Patient did not receive TPA.  Echocardiogram with ejection fraction of 65% no wall motion abnormalities.  Neurology follow-up currently on aspirin for CVA prophylaxis.  Subcutaneous Lovenox for DVT prophylaxis.  Await plan for possible TEE.  Therapy evaluations completed with recommendations of physical medicine rehab consult.   Review of Systems  Constitutional: Negative for chills and fever.  HENT: Negative for hearing loss.   Eyes: Negative for blurred vision and double vision.  Respiratory: Negative for cough and shortness of breath.   Cardiovascular: Negative for chest pain, palpitations and leg swelling.  Gastrointestinal: Positive for constipation, nausea and vomiting.  Genitourinary: Negative for dysuria, flank pain and hematuria.  Skin: Negative for rash.  Neurological: Positive for dizziness. Negative for seizures.  All other systems reviewed and are negative.  Past Medical History:  Diagnosis Date  . Heart murmur    History reviewed. No pertinent surgical history. Family History    Problem Relation Age of Onset  . Cancer Mother   . Cancer Father   . Heart disease Father    Social History:  reports that he has never smoked. He has never used smokeless tobacco. He reports that he drinks about 10.0 standard drinks of alcohol per week. He reports that he does not use drugs. Allergies: No Known Allergies No medications prior to admission.    Home: Home Living Family/patient expects to be discharged to:: Private residence Living Arrangements: Other (Comment) Available Help at Discharge: Family, Friend(s), Available PRN/intermittently Type of Home: House Home Access: Stairs to enter Secretary/administrator of Steps: 3 Entrance Stairs-Rails: Left Home Layout: One level Bathroom Shower/Tub: Engineer, manufacturing systems: Standard Home Equipment: None Additional Comments: lives with best friend "Jonny Ruiz" - mom/ dad live down the street and are retired. 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 Status:  Mobility: Bed Mobility Overal bed mobility: Needs Assistance Bed Mobility: Supine to Sit Supine to sit: Min guard General bed mobility comments: for safety and upright balance - increased dizziness with movement Transfers Overall transfer level: Needs assistance Equipment used: 2 person hand held assist Transfers: Sit to/from Stand Sit to Stand: Min assist, +2 physical assistance General transfer comment: for safety and stability - patient wanting to reach forward to sink, with verbal cueing required to push up from bedside Ambulation/Gait Ambulation/Gait assistance: Min assist, Mod assist, +2 physical assistance Gait Distance (Feet): 20 Feet Assistive device: 2 person hand held assist Gait Pattern/deviations: Step-to pattern, Step-through pattern, Decreased stride length, Staggering left, Staggering right General Gait Details: very unsteady;  weight bearing primarily thorugh L UE; increased dizziness with activity; very unsteady - unable to navigate straight path Gait velocity: decreased    ADL: ADL Overall ADL's : Needs assistance/impaired Eating/Feeding: Set up, Sitting Eating/Feeding Details (indicate cue type and reason): pt eating with R hand only with packages pre opened Grooming: Wash/dry hands, Standing, Moderate assistance Grooming Details (indicate cue type and reason): requires total+2 person (A) for balance and progress to leaning on sink for mod (A) Upper Body Bathing: Minimal assistance Lower Body Bathing: Minimal assistance Lower Body Dressing: Moderate assistance Lower Body Dressing Details (indicate cue type and reason): pt able to don socks supine and shoes by pushing feet into shoes. pt able to tie shoes with incr time and very dizzy after leaning over Toilet Transfer: +2 for physical assistance, +2 for safety/equipment, Moderate assistance Functional mobility during ADLs: +2 for physical assistance, +2 for safety/equipment, Minimal assistance General ADL Comments: pt requires (A) for basic transfer +2 (A)  Cognition: Cognition Overall Cognitive Status: Within Functional Limits for tasks assessed Cognition Arousal/Alertness: Awake/alert Behavior During Therapy: WFL for tasks assessed/performed Overall Cognitive Status: Within Functional Limits for tasks assessed  Blood pressure 118/81, pulse 68, temperature 98.1 F (36.7 C), temperature source Oral, resp. rate 16, height 5\' 10"  (1.778 m), weight 102.1 kg, SpO2 99 %. Physical Exam  Vitals reviewed. Constitutional: He is oriented to person, place, and time. He appears well-developed.  HENT:  Head: Normocephalic.  Eyes: EOM are normal. Right eye exhibits no discharge. Left eye exhibits no discharge.  Neck: Normal range of motion. Neck supple. No thyromegaly present.  Cardiovascular: Normal rate, regular rhythm and normal heart sounds.  Respiratory:  Effort normal and breath sounds normal. No respiratory distress.  GI: Soft. Bowel sounds are normal. He exhibits no distension.  Neurological: He is alert and oriented to person, place, and time.  Mood is a bit flat but appropriate.  Follows full commands. Ataxia LUE and LLE.   Skin: Skin is warm and dry.    Results for orders placed or performed during the hospital encounter of 11/12/17 (from the past 24 hour(s))  Urine rapid drug screen (hosp performed)     Status: Abnormal   Collection Time: 11/12/17  6:58 AM  Result Value Ref Range   Opiates (A) NONE DETECTED    Result not available. Reagent lot number recalled by manufacturer.   Cocaine NONE DETECTED NONE DETECTED   Benzodiazepines NONE DETECTED NONE DETECTED   Amphetamines NONE DETECTED NONE DETECTED   Tetrahydrocannabinol NONE DETECTED NONE DETECTED   Barbiturates NONE DETECTED NONE DETECTED  Urinalysis, Routine w reflex microscopic     Status: Abnormal   Collection Time: 11/12/17  6:58 AM  Result Value Ref Range   Color, Urine YELLOW YELLOW   APPearance CLEAR CLEAR   Specific Gravity, Urine 1.038 (H) 1.005 - 1.030   pH 8.0 5.0 - 8.0   Glucose, UA 50 (A) NEGATIVE mg/dL   Hgb urine dipstick NEGATIVE NEGATIVE   Bilirubin Urine NEGATIVE NEGATIVE   Ketones, ur NEGATIVE NEGATIVE mg/dL   Protein, ur 30 (A) NEGATIVE mg/dL   Nitrite NEGATIVE NEGATIVE   Leukocytes, UA NEGATIVE NEGATIVE   RBC / HPF 0-5 0 - 5 RBC/hpf   WBC, UA 0-5 0 - 5 WBC/hpf   Bacteria, UA NONE SEEN NONE SEEN   Mucus PRESENT   Ethanol     Status: None   Collection Time: 11/12/17  7:28 AM  Result Value Ref Range   Alcohol, Ethyl (B) <10 <  10 mg/dL  Protime-INR     Status: None   Collection Time: 11/12/17  7:28 AM  Result Value Ref Range   Prothrombin Time 12.7 11.4 - 15.2 seconds   INR 0.96   APTT     Status: None   Collection Time: 11/12/17  7:28 AM  Result Value Ref Range   aPTT 27 24 - 36 seconds  CBC     Status: Abnormal   Collection Time: 11/12/17   7:28 AM  Result Value Ref Range   WBC 10.8 (H) 4.0 - 10.5 K/uL   RBC 5.22 4.22 - 5.81 MIL/uL   Hemoglobin 14.9 13.0 - 17.0 g/dL   HCT 16.1 09.6 - 04.5 %   MCV 81.2 78.0 - 100.0 fL   MCH 28.5 26.0 - 34.0 pg   MCHC 35.1 30.0 - 36.0 g/dL   RDW 40.9 81.1 - 91.4 %   Platelets 280 150 - 400 K/uL  Differential     Status: Abnormal   Collection Time: 11/12/17  7:28 AM  Result Value Ref Range   Neutrophils Relative % 91 %   Neutro Abs 9.7 (H) 1.7 - 7.7 K/uL   Lymphocytes Relative 6 %   Lymphs Abs 0.7 0.7 - 4.0 K/uL   Monocytes Relative 3 %   Monocytes Absolute 0.4 0.1 - 1.0 K/uL   Eosinophils Relative 0 %   Eosinophils Absolute 0.0 0.0 - 0.7 K/uL   Basophils Relative 0 %   Basophils Absolute 0.0 0.0 - 0.1 K/uL  Comprehensive metabolic panel     Status: Abnormal   Collection Time: 11/12/17  7:28 AM  Result Value Ref Range   Sodium 138 135 - 145 mmol/L   Potassium 3.8 3.5 - 5.1 mmol/L   Chloride 104 98 - 111 mmol/L   CO2 24 22 - 32 mmol/L   Glucose, Bld 146 (H) 70 - 99 mg/dL   BUN 15 6 - 20 mg/dL   Creatinine, Ser 7.82 0.61 - 1.24 mg/dL   Calcium 8.9 8.9 - 95.6 mg/dL   Total Protein 7.2 6.5 - 8.1 g/dL   Albumin 4.2 3.5 - 5.0 g/dL   AST 25 15 - 41 U/L   ALT 26 0 - 44 U/L   Alkaline Phosphatase 43 38 - 126 U/L   Total Bilirubin 1.9 (H) 0.3 - 1.2 mg/dL   GFR calc non Af Amer >60 >60 mL/min   GFR calc Af Amer >60 >60 mL/min   Anion gap 10 5 - 15  I-stat troponin, ED     Status: None   Collection Time: 11/12/17  7:36 AM  Result Value Ref Range   Troponin i, poc 0.00 0.00 - 0.08 ng/mL   Comment 3          I-Stat Chem 8, ED     Status: Abnormal   Collection Time: 11/12/17  7:38 AM  Result Value Ref Range   Sodium 138 135 - 145 mmol/L   Potassium 3.8 3.5 - 5.1 mmol/L   Chloride 103 98 - 111 mmol/L   BUN 14 6 - 20 mg/dL   Creatinine, Ser 2.13 0.61 - 1.24 mg/dL   Glucose, Bld 086 (H) 70 - 99 mg/dL   Calcium, Ion 5.78 (L) 1.15 - 1.40 mmol/L   TCO2 21 (L) 22 - 32 mmol/L    Hemoglobin 14.3 13.0 - 17.0 g/dL   HCT 46.9 62.9 - 52.8 %   Ct Angio Head W Or Wo Contrast  Result Date: 11/12/2017 CLINICAL DATA:  Acute left cerebellar infarction. Dizziness, nausea and vomiting. EXAM: CT ANGIOGRAPHY HEAD AND NECK TECHNIQUE: Multidetector CT imaging of the head and neck was performed using the standard protocol during bolus administration of intravenous contrast. Multiplanar CT image reconstructions and MIPs were obtained to evaluate the vascular anatomy. Carotid stenosis measurements (when applicable) are obtained utilizing NASCET criteria, using the distal internal carotid diameter as the denominator. CONTRAST:  80mL ISOVUE-370 IOPAMIDOL (ISOVUE-370) INJECTION 76% COMPARISON:  CT and MR study same day. FINDINGS: CTA NECK FINDINGS Aortic arch: Normal Right carotid system: Common carotid artery widely patent to the bifurcation. The carotid bifurcation is normal without soft or calcified plaque. Cervical internal carotid artery is tortuous but widely patent. Left carotid system: Common carotid artery widely patent to the bifurcation. Carotid bifurcation is normal without soft or calcified plaque. Cervical ICA is tortuous but widely patent. Vertebral arteries: Both subclavian arteries appear normal. Both vertebral artery origins are widely patent. The vertebral arteries are approximately equal in size an widely patent through the cervical region to the foramen magnum. Skeleton: Ordinary cervical spondylosis. Other neck: No mass or lymphadenopathy. Upper chest: Normal Review of the MIP images confirms the above findings CTA HEAD FINDINGS Anterior circulation: Both internal carotid arteries are widely patent through the skull base and siphon regions. The anterior and middle cerebral vessels are patent without proximal stenosis, aneurysm or vascular malformation. Posterior circulation: Both vertebral arteries are widely patent at and through the foramen magnum to the basilar. No basilar stenosis.  There are diminutive posterior inferior cerebellar arteries. There are bilateral anterior inferior cerebellar arteries which are patent. Right superior cerebellar artery is normal. Left superior cerebellar artery is occluded just beyond its origin. Both posterior inferior cerebellar arteries are patent and normal. Venous sinuses: Patent and normal. Anatomic variants: None significant. Delayed phase: No abnormal enhancement. Review of the MIP images confirms the above findings IMPRESSION: The patient does not show evidence of atherosclerotic vascular disease in general or of dissection. 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 Electronically Signed   By: Paulina Fusi M.D.   On: 11/12/2017 08:58   Ct Head Wo Contrast  Result Date: 11/12/2017 CLINICAL DATA:  Dizziness. Nausea and vomiting. Hydraulic fluid in the ear at work. EXAM: CT HEAD WITHOUT CONTRAST TECHNIQUE: Contiguous axial images were obtained from the base of the skull through the vertex without intravenous contrast. COMPARISON:  None. FINDINGS: Brain: There is suggestion of asymmetrical low-attenuation and possible mass effect in the left anterior cerebellum and cerebellar peduncle. This could represent an area of acute infarct or possibly a mass lesion. MRI is suggested for further evaluation. Intracranial contents are otherwise unremarkable. No ventricular dilatation. No abnormal extra-axial fluid collections. Gray-white matter junctions are distinct. Basal cisterns are not effaced. No acute intracranial hemorrhage. Vascular: No hyperdense vessel or unexpected calcification. Skull: Normal. Negative for fracture or focal lesion. Sinuses/Orbits: Mild mucosal thickening in the paranasal sinuses. No acute air-fluid levels. Mastoid air cells and middle ear cavities are clear. Other: None. IMPRESSION: Suggestion of asymmetrical low-attenuation and possible mass effect in the left anterior cerebellum and  cerebellar peduncle. MRI is recommended for further evaluation. No acute intracranial hemorrhage or mass effect. Electronically Signed   By: Burman Nieves M.D.   On: 11/12/2017 05:42   Ct Angio Neck W And/or Wo Contrast  Result Date: 11/12/2017 CLINICAL DATA:  Acute left cerebellar infarction. Dizziness, nausea and vomiting. EXAM: CT ANGIOGRAPHY HEAD AND NECK TECHNIQUE: Multidetector CT imaging of  the head and neck was performed using the standard protocol during bolus administration of intravenous contrast. Multiplanar CT image reconstructions and MIPs were obtained to evaluate the vascular anatomy. Carotid stenosis measurements (when applicable) are obtained utilizing NASCET criteria, using the distal internal carotid diameter as the denominator. CONTRAST:  80mL ISOVUE-370 IOPAMIDOL (ISOVUE-370) INJECTION 76% COMPARISON:  CT and MR study same day. FINDINGS: CTA NECK FINDINGS Aortic arch: Normal Right carotid system: Common carotid artery widely patent to the bifurcation. The carotid bifurcation is normal without soft or calcified plaque. Cervical internal carotid artery is tortuous but widely patent. Left carotid system: Common carotid artery widely patent to the bifurcation. Carotid bifurcation is normal without soft or calcified plaque. Cervical ICA is tortuous but widely patent. Vertebral arteries: Both subclavian arteries appear normal. Both vertebral artery origins are widely patent. The vertebral arteries are approximately equal in size an widely patent through the cervical region to the foramen magnum. Skeleton: Ordinary cervical spondylosis. Other neck: No mass or lymphadenopathy. Upper chest: Normal Review of the MIP images confirms the above findings CTA HEAD FINDINGS Anterior circulation: Both internal carotid arteries are widely patent through the skull base and siphon regions. The anterior and middle cerebral vessels are patent without proximal stenosis, aneurysm or vascular malformation.  Posterior circulation: Both vertebral arteries are widely patent at and through the foramen magnum to the basilar. No basilar stenosis. There are diminutive posterior inferior cerebellar arteries. There are bilateral anterior inferior cerebellar arteries which are patent. Right superior cerebellar artery is normal. Left superior cerebellar artery is occluded just beyond its origin. Both posterior inferior cerebellar arteries are patent and normal. Venous sinuses: Patent and normal. Anatomic variants: None significant. Delayed phase: No abnormal enhancement. Review of the MIP images confirms the above findings IMPRESSION: The patient does not show evidence of atherosclerotic vascular disease in general or of dissection. 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 Electronically Signed   By: Paulina FusiMark  Shogry M.D.   On: 11/12/2017 08:58   Mr Brain Wo Contrast  Result Date: 11/12/2017 CLINICAL DATA:  Acute presentation with dizziness, nausea and vomiting. Hydraulic fluid in the ear at work. EXAM: MRI HEAD WITHOUT CONTRAST TECHNIQUE: Multiplanar, multiecho pulse sequences of the brain and surrounding structures were obtained without intravenous contrast. COMPARISON:  CT same day FINDINGS: Brain: 3.5 cm in diameter acute infarction affecting the superior cerebellum on the left. No other acute infarction. No significant mass effect. No sign of hemorrhage. No abnormal finding affecting the brainstem. Cerebral hemispheres show scattered foci of T2 and FLAIR signal in the deep white matter consistent with early small vessel ischemic change of a chronic nature. No mass lesion, hydrocephalus or extra-axial collection. Vascular: Major vessels at the base of the brain show flow. Skull and upper cervical spine: Normal Sinuses/Orbits: Minimal mucosal inflammatory changes of the left maxillary sinus. Other: None IMPRESSION: 3.5 cm in diameter acute infarction in the superior cerebellum  on the left. No evidence of significant swelling/mass effect or of any hemorrhage. Mild chronic small-vessel ischemic change of the cerebral hemispheric white matter. No large vessel vascular occlusion identified on this noncontrast study. Electronically Signed   By: Paulina FusiMark  Shogry M.D.   On: 11/12/2017 07:09     Assessment/Plan: Diagnosis: left cerebellar infarct 1. Does the need for close, 24 hr/day medical supervision in concert with the patient's rehab needs make it unreasonable for this patient to be served in a less intensive setting? Yes 2. Co-Morbidities requiring supervision/potential  complications: htn 3. Due to bladder management, bowel management, safety, skin/wound care, disease management, medication administration, pain management and patient education, does the patient require 24 hr/day rehab nursing? Yes 4. Does the patient require coordinated care of a physician, rehab nurse, PT (1-2 hrs/day, 5 days/week) and OT (1-2 hrs/day, 5 days/week) to address physical and functional deficits in the context of the above medical diagnosis(es)? Yes Addressing deficits in the following areas: balance, endurance, locomotion, strength, transferring, bowel/bladder control, bathing, dressing, feeding, grooming, toileting and psychosocial support 5. Can the patient actively participate in an intensive therapy program of at least 3 hrs of therapy per day at least 5 days per week? Yes 6. The potential for patient to make measurable gains while on inpatient rehab is good 7. Anticipated functional outcomes upon discharge from inpatient rehab are modified independent  with PT, modified independent with OT, n/a with SLP. 8. Estimated rehab length of stay to reach the above functional goals is: 11-15 days 9. Anticipated D/C setting: Home 10. Anticipated post D/C treatments: HH therapy 11. Overall Rehab/Functional Prognosis: excellent  RECOMMENDATIONS: This patient's condition is appropriate for continued  rehabilitative care in the following setting: CIR Patient has agreed to participate in recommended program. Yes Note that insurance prior authorization may be required for reimbursement for recommended care.  Comment: Rehab Admissions Coordinator to follow up.  Thanks,  Ranelle Oyster, MD, [place] [facility]  I have personally performed a face to face diagnostic evaluation of this patient. Additionally, I have reviewed and concur with the physician assistant's documentation above.    Mcarthur Rossetti Angiulli, PA-C 11/12/2017

## 2017-11-12 NOTE — ED Notes (Addendum)
Pt had an one episode of emesis.

## 2017-11-12 NOTE — ED Notes (Signed)
Patient transported to MRI 

## 2017-11-12 NOTE — ED Notes (Signed)
Bed: [id number] Expected date:  Expected time:  Means of arrival:  Comments: EMS 52 yo male from home/dizzy,nausea-air plane hydraulic fluid in ear

## 2017-11-12 NOTE — Evaluation (Signed)
Occupational Therapy Evaluation Patient Details Name: John Mcbride MRN: [id number] DOB: Nov 04, 1965 Today's Date: 11/12/2017    History of Present Illness Patient is a 52 y/o male presenting to the ED on 11/12/17 due to dizziness, N&V, and imbalance. Head CT w/o Contrast showed symmetrical low-attenuation and possible mass-effect in left anterior cerebellum and cerebellar peduncle. 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  PMH significant for heart murmur of unknown type, alcohol use.    Clinical Impression   PT admitted with L cerebellum infarct. Pt currently with functional limitiations due to the deficits listed below (see OT problem list). Pt with balance deficits affecting all adls and requires total +2 min (A) for basic transfer. Pt high fall risk.  Pt will benefit from skilled OT to increase their independence and safety with adls and balance to allow discharge CIR. Pt must reach MOD I to return home.     Follow Up Recommendations  CIR    Equipment Recommendations  None recommended by OT    Recommendations for Other Services Rehab consult     Precautions / Restrictions Precautions Precautions: Fall Restrictions Weight Bearing Restrictions: No      Mobility Bed Mobility Overal bed mobility: Needs Assistance Bed Mobility: Supine to Sit     Supine to sit: Min guard     General bed mobility comments: for safety and upright balance - increased dizziness with movement  Transfers Overall transfer level: Needs assistance Equipment used: 2 person hand held assist Transfers: Sit to/from Stand Sit to Stand: Min assist;+2 physical assistance         General transfer comment: for safety and stability - patient wanting to reach forward to sink, with verbal cueing required to push up from bedside    Balance Overall balance assessment: Needs assistance Sitting-balance support: Bilateral upper extremity  supported;Feet supported Sitting balance-Leahy Scale: Fair     Standing balance support: Bilateral upper extremity supported;During functional activity Standing balance-Leahy Scale: Poor Standing balance comment: reliant on UE support to maintain upright balance                           ADL either performed or assessed with clinical judgement   ADL Overall ADL's : Needs assistance/impaired Eating/Feeding: Set up;Sitting Eating/Feeding Details (indicate cue type and reason): pt eating with R hand only with packages pre opened Grooming: Wash/dry hands;Standing;Moderate assistance Grooming Details (indicate cue type and reason): requires total+2 person (A) for balance and progress to leaning on sink for mod (A) Upper Body Bathing: Minimal assistance   Lower Body Bathing: Minimal assistance       Lower Body Dressing: Moderate assistance Lower Body Dressing Details (indicate cue type and reason): pt able to don socks supine and shoes by pushing feet into shoes. pt able to tie shoes with incr time and very dizzy after leaning over Toilet Transfer: +2 for physical assistance;+2 for safety/equipment;Moderate assistance           Functional mobility during ADLs: +2 for physical assistance;+2 for safety/equipment;Minimal assistance General ADL Comments: pt requires (A) for basic transfer +2 (A)     Vision   Additional Comments: noted to have upward beating nystagmus     Perception     Praxis      Pertinent Vitals/Pain Pain Assessment: No/denies pain(dizziness 8 out 10)     Hand Dominance Right   Extremity/Trunk Assessment Upper Extremity Assessment Upper  Extremity Assessment: LUE deficits/detail LUE Deficits / Details: decr grasp 4 out 5 , ataxic movement LUE Sensation: decreased proprioception LUE Coordination: decreased gross motor   Lower Extremity Assessment Lower Extremity Assessment: Defer to PT evaluation RLE Deficits / Details: grossly 5/5  stength RLE Coordination: WNL LLE Deficits / Details: grossly 5/5 stength LLE Coordination: decreased gross motor   Cervical / Trunk Assessment Cervical / Trunk Assessment: Normal   Communication Communication Communication: No difficulties   Cognition Arousal/Alertness: Awake/alert Behavior During Therapy: WFL for tasks assessed/performed Overall Cognitive Status: Within Functional Limits for tasks assessed                                     General Comments       Exercises     Shoulder Instructions      Home Living Family/patient expects to be discharged to:: Private residence Living Arrangements: Other (Comment) Available Help at Discharge: Family;Friend(s);Available PRN/intermittently Type of Home: House Home Access: Stairs to enter Entergy CorporationEntrance Stairs-Number of Steps: 3 Entrance Stairs-Rails: Left Home Layout: One level     Bathroom Shower/Tub: Chief Strategy OfficerTub/shower unit   Bathroom Toilet: Standard     Home Equipment: None   Additional Comments: lives with best friend "John Mcbride" - mom/ dad live down the street and are retired. pt works on air planes for a living      Prior Functioning/Environment Level of Independence: Independent        Comments: works full time - Dispensing opticianairplane repair        OT Problem List: Decreased strength;Decreased activity tolerance;Decreased range of motion;Impaired balance (sitting and/or standing);Decreased coordination;Decreased safety awareness;Decreased knowledge of use of DME or AE;Decreased knowledge of precautions;Impaired UE functional use;Impaired sensation      OT Treatment/Interventions: Self-care/ADL training;Therapeutic exercise;Neuromuscular education;DME and/or AE instruction;Therapeutic activities;Patient/family education;Balance training    OT Goals(Current goals can be found in the care plan section) Acute Rehab OT Goals Patient Stated Goal: improve dizziness OT Goal Formulation: With patient Time For Goal  Achievement: 11/26/17 Potential to Achieve Goals: Good  OT Frequency: Min 3X/week   Barriers to D/C:            Co-evaluation PT/OT/SLP Co-Evaluation/Treatment: Yes Reason for Co-Treatment: Complexity of the patient's impairments (multi-system involvement);Necessary to address cognition/behavior during functional activity;For patient/therapist safety;To address functional/ADL transfers PT goals addressed during session: Mobility/safety with mobility;Balance OT goals addressed during session: Proper use of Adaptive equipment and DME;Strengthening/ROM;ADL's and self-care      AM-PAC PT "6 Clicks" Daily Activity     Outcome Measure Help from another person eating meals?: A Little Help from another person taking care of personal grooming?: A Little Help from another person toileting, which includes using toliet, bedpan, or urinal?: A Little Help from another person bathing (including washing, rinsing, drying)?: A Little Help from another person to put on and taking off regular upper body clothing?: A Little Help from another person to put on and taking off regular lower body clothing?: A Little 6 Click Score: 18   End of Session Equipment Utilized During Treatment: Gait belt Nurse Communication: Mobility status;Precautions  Activity Tolerance: Patient tolerated treatment well Patient left: in chair;with call bell/phone within reach;with chair alarm set;with family/visitor present  OT Visit Diagnosis: Unsteadiness on feet (R26.81);Muscle weakness (generalized) (M62.81)                Time: [id number] OT Time Calculation (min): 23 min Charges:  OT General  Charges $OT Visit: 1 Visit OT Evaluation $OT Eval Moderate Complexity: 1 Mod   Mateo Flow   OTR/L Pager: [phone] Office: [phone] .   Boone Master B 11/12/2017, 2:30 PM

## 2017-11-12 NOTE — H&P (Signed)
History and Physical    John Mcbride WNU:[id number] DOB: [birth year] DOA: 11/12/2017  PCP: Patient, No Pcp Per   Patient coming from: Home  Chief Complaint: Nausea, Vomiting, Dizziness and Imbalance  HPI: John Mcbride is a 52 y.o. male with medical history significant of a heart murmur of unknown type, alcohol use, and other comorbidities who has not seen a physician in years presenting with a chief complaint of dizziness associated with nausea and vomiting.  Patient states that he woke up this morning around midnight and was significantly dizzy and nauseous.  Felt as if he was on a "boat that was rocking".  States that he was unable to ambulate because of his dizziness symptoms.  Patient denied any headaches, blurred vision double vision numbness or tingling in extremities however he did state that he felt weak on his left side.  Patient states the dizziness was significant enough for him to call EMS and while he was in the EMS he had episodes of nausea vomiting which is improved with IV Zofran.  This is never happened to him in the past and states that he has no other real medical issues.  Bloody.  Patient denied any chest pain, shortness breath, burning or discomfort in the urine, and denies any abdominal pain or diarrhea or constipation.  Up and found to have an acute cerebellar CVA and [facility] was called to admit this patient neurology to consult at [facility].  ED Course: Had a stroke alert CT scan of the head along with basic blood work.  Neurology Dr. Laurence Slate was consulted who recommended further work-up and admission to [facility].  Metoclopramide along with meclizine for his dizziness.  Review of Systems: As per HPI otherwise 10 point review of systems negative.   Past Medical History:  Diagnosis Date  . Heart murmur    SURGICAL HISTORY History reviewed. No pertinent surgical history.  SOCIAL HISTORY  reports that he has never smoked. He has never used smokeless tobacco. He reports  that he drinks about 10.0 standard drinks of alcohol per week. He reports that he does not use drugs.  ALLERGIES No Known Allergies  Family History  Problem Relation Age of Onset  . Cancer Mother   . Cancer Father   . Heart disease Father    Prior to Admission medications   Medication Sig Start Date End Date Taking? Authorizing Provider  azithromycin (ZITHROMAX) 250 MG tablet Take 2 tabs PO x 1 dose, then 1 tab PO QD x 4 days Patient not taking: Reported on 11/12/2017 01/26/15   [name], MD  HYDROcodone-homatropine [facility]) 5-1.5 MG/5ML syrup Take 5 mLs by mouth every 8 (eight) hours as needed for cough. Patient not taking: Reported on 11/12/2017 01/26/15   [name], MD  omeprazole (PRILOSEC) 20 MG capsule Take 1 capsule (20 mg total) by mouth daily. Patient not taking: Reported on 11/12/2017 01/26/15   [name], MD   Physical Exam: Vitals:   11/12/17 0243 11/12/17 0417 11/12/17 0733 11/12/17 0800  BP:  118/74 123/79 119/70  Pulse:  90 80 69  Resp:  16 20 19   Temp:      TempSrc:      SpO2:  95% 99% 96%  Weight: 102.1 kg     Height: 5\' 10"  (1.778 m)      Constitutional: WN/WD obese Caucasian male in NAD and appears calm and comfortable Eyes: Lids and conjunctivae normal, sclerae anicteric  ENMT: External Ears, Nose appear normal. Grossly normal hearing.  Mucous membranes are moist.  Neck: Appears normal, supple, no cervical masses, normal ROM, no appreciable thyromegaly, no JVD Respiratory: Diminished to auscultation bilaterally, no wheezing, rales, rhonchi or crackles. Normal respiratory effort and patient is not tachypenic. No accessory muscle use.  Cardiovascular: RRR, Slight murmur heard. S1 and S2 auscultated. Trace extremity edema.  Abdomen: Soft, non-tender, Distended due to body habitus. No masses palpated. No appreciable hepatosplenomegaly. Bowel sounds positive x4.  GU: Deferred. Musculoskeletal: No clubbing / cyanosis of digits/nails. No joint  deformity upper and lower extremities.  Skin: No rashes, lesions, ulcers on a limited skin eval. No induration; Warm and dry.  Neurologic: CN 2-12 grossly intact with no focal deficits. Moves extremities independently  Psychiatric: Normal judgment and insight. Alert and oriented x 3. Normal mood and appropriate affect.   Labs on Admission: I have personally reviewed following labs and imaging studies  CBC: Recent Labs  Lab 11/12/17 0728 11/12/17 0738  WBC 10.8*  --   NEUTROABS 9.7*  --   HGB 14.9 14.3  HCT 42.4 42.0  MCV 81.2  --   PLT 280  --    Basic Metabolic Panel: Recent Labs  Lab 11/12/17 0728 11/12/17 0738  NA 138 138  K 3.8 3.8  CL 104 103  CO2 24  --   GLUCOSE 146* 142*  BUN 15 14  CREATININE 0.96 0.90  CALCIUM 8.9  --    GFR: Estimated Creatinine Clearance: 114.9 mL/min (by C-G formula based on SCr of 0.9 mg/dL). Liver Function Tests: Recent Labs  Lab 11/12/17 0728  AST 25  ALT 26  ALKPHOS 43  BILITOT 1.9*  PROT 7.2  ALBUMIN 4.2   No results for input(s): LIPASE, AMYLASE in the last 168 hours. No results for input(s): AMMONIA in the last 168 hours. Coagulation Profile: Recent Labs  Lab 11/12/17 0728  INR 0.96   Cardiac Enzymes: No results for input(s): CKTOTAL, CKMB, CKMBINDEX, TROPONINI in the last 168 hours. BNP (last 3 results) No results for input(s): PROBNP in the last 8760 hours. HbA1C: No results for input(s): HGBA1C in the last 72 hours. CBG: No results for input(s): GLUCAP in the last 168 hours. Lipid Profile: No results for input(s): CHOL, HDL, LDLCALC, TRIG, CHOLHDL, LDLDIRECT in the last 72 hours. Thyroid Function Tests: No results for input(s): TSH, T4TOTAL, FREET4, T3FREE, THYROIDAB in the last 72 hours. Anemia Panel: No results for input(s): VITAMINB12, FOLATE, FERRITIN, TIBC, IRON, RETICCTPCT in the last 72 hours. Urine analysis: No results found for: COLORURINE, APPEARANCEUR, LABSPEC, PHURINE, GLUCOSEU, HGBUR,  BILIRUBINUR, KETONESUR, PROTEINUR, UROBILINOGEN, NITRITE, LEUKOCYTESUR Sepsis Labs: !!!!!!!!!!!!!!!!!!!!!!!!!!!!!!!!!!!!!!!!!!!! @LABRCNTIP (procalcitonin:4,lacticidven:4) )No results found for this or any previous visit (from the past 240 hour(s)).   Radiological Exams on Admission: Ct Angio Head W Or Wo Contrast  Result Date: 11/12/2017 CLINICAL DATA:  Acute left cerebellar infarction. Dizziness, nausea and vomiting. EXAM: CT ANGIOGRAPHY HEAD AND NECK TECHNIQUE: Multidetector CT imaging of the head and neck was performed using the standard protocol during bolus administration of intravenous contrast. Multiplanar CT image reconstructions and MIPs were obtained to evaluate the vascular anatomy. Carotid stenosis measurements (when applicable) are obtained utilizing NASCET criteria, using the distal internal carotid diameter as the denominator. CONTRAST:  80mL ISOVUE-370 IOPAMIDOL (ISOVUE-370) INJECTION 76% COMPARISON:  CT and MR study same day. FINDINGS: CTA NECK FINDINGS Aortic arch: Normal Right carotid system: Common carotid artery widely patent to the bifurcation. The carotid bifurcation is normal without soft or calcified plaque. Cervical internal carotid artery is tortuous but widely  patent. Left carotid system: Common carotid artery widely patent to the bifurcation. Carotid bifurcation is normal without soft or calcified plaque. Cervical ICA is tortuous but widely patent. Vertebral arteries: Both subclavian arteries appear normal. Both vertebral artery origins are widely patent. The vertebral arteries are approximately equal in size an widely patent through the cervical region to the foramen magnum. Skeleton: Ordinary cervical spondylosis. Other neck: No mass or lymphadenopathy. Upper chest: Normal Review of the MIP images confirms the above findings CTA HEAD FINDINGS Anterior circulation: Both internal carotid arteries are widely patent through the skull base and siphon regions. The anterior and middle  cerebral vessels are patent without proximal stenosis, aneurysm or vascular malformation. Posterior circulation: Both vertebral arteries are widely patent at and through the foramen magnum to the basilar. No basilar stenosis. There are diminutive posterior inferior cerebellar arteries. There are bilateral anterior inferior cerebellar arteries which are patent. Right superior cerebellar artery is normal. Left superior cerebellar artery is occluded just beyond its origin. Both posterior inferior cerebellar arteries are patent and normal. Venous sinuses: Patent and normal. Anatomic variants: None significant. Delayed phase: No abnormal enhancement. Review of the MIP images confirms the above findings IMPRESSION: The patient does not show evidence of atherosclerotic vascular disease in general or of dissection. 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 Electronically Signed   By: Paulina FusiMark  Shogry M.D.   On: 11/12/2017 08:58   Ct Head Wo Contrast  Result Date: 11/12/2017 CLINICAL DATA:  Dizziness. Nausea and vomiting. Hydraulic fluid in the ear at work. EXAM: CT HEAD WITHOUT CONTRAST TECHNIQUE: Contiguous axial images were obtained from the base of the skull through the vertex without intravenous contrast. COMPARISON:  None. FINDINGS: Brain: There is suggestion of asymmetrical low-attenuation and possible mass effect in the left anterior cerebellum and cerebellar peduncle. This could represent an area of acute infarct or possibly a mass lesion. MRI is suggested for further evaluation. Intracranial contents are otherwise unremarkable. No ventricular dilatation. No abnormal extra-axial fluid collections. Gray-white matter junctions are distinct. Basal cisterns are not effaced. No acute intracranial hemorrhage. Vascular: No hyperdense vessel or unexpected calcification. Skull: Normal. Negative for fracture or focal lesion. Sinuses/Orbits: Mild mucosal thickening in the  paranasal sinuses. No acute air-fluid levels. Mastoid air cells and middle ear cavities are clear. Other: None. IMPRESSION: Suggestion of asymmetrical low-attenuation and possible mass effect in the left anterior cerebellum and cerebellar peduncle. MRI is recommended for further evaluation. No acute intracranial hemorrhage or mass effect. Electronically Signed   By: Burman NievesWilliam  Stevens M.D.   On: 11/12/2017 05:42   Ct Angio Neck W And/or Wo Contrast  Result Date: 11/12/2017 CLINICAL DATA:  Acute left cerebellar infarction. Dizziness, nausea and vomiting. EXAM: CT ANGIOGRAPHY HEAD AND NECK TECHNIQUE: Multidetector CT imaging of the head and neck was performed using the standard protocol during bolus administration of intravenous contrast. Multiplanar CT image reconstructions and MIPs were obtained to evaluate the vascular anatomy. Carotid stenosis measurements (when applicable) are obtained utilizing NASCET criteria, using the distal internal carotid diameter as the denominator. CONTRAST:  80mL ISOVUE-370 IOPAMIDOL (ISOVUE-370) INJECTION 76% COMPARISON:  CT and MR study same day. FINDINGS: CTA NECK FINDINGS Aortic arch: Normal Right carotid system: Common carotid artery widely patent to the bifurcation. The carotid bifurcation is normal without soft or calcified plaque. Cervical internal carotid artery is tortuous but widely patent. Left carotid system: Common carotid artery widely patent to the bifurcation. Carotid bifurcation is normal without soft or  calcified plaque. Cervical ICA is tortuous but widely patent. Vertebral arteries: Both subclavian arteries appear normal. Both vertebral artery origins are widely patent. The vertebral arteries are approximately equal in size an widely patent through the cervical region to the foramen magnum. Skeleton: Ordinary cervical spondylosis. Other neck: No mass or lymphadenopathy. Upper chest: Normal Review of the MIP images confirms the above findings CTA HEAD FINDINGS  Anterior circulation: Both internal carotid arteries are widely patent through the skull base and siphon regions. The anterior and middle cerebral vessels are patent without proximal stenosis, aneurysm or vascular malformation. Posterior circulation: Both vertebral arteries are widely patent at and through the foramen magnum to the basilar. No basilar stenosis. There are diminutive posterior inferior cerebellar arteries. There are bilateral anterior inferior cerebellar arteries which are patent. Right superior cerebellar artery is normal. Left superior cerebellar artery is occluded just beyond its origin. Both posterior inferior cerebellar arteries are patent and normal. Venous sinuses: Patent and normal. Anatomic variants: None significant. Delayed phase: No abnormal enhancement. Review of the MIP images confirms the above findings IMPRESSION: The patient does not show evidence of atherosclerotic vascular disease in general or of dissection. 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 Electronically Signed   By: Paulina Fusi M.D.   On: 11/12/2017 08:58   Mr Brain Wo Contrast  Result Date: 11/12/2017 CLINICAL DATA:  Acute presentation with dizziness, nausea and vomiting. Hydraulic fluid in the ear at work. EXAM: MRI HEAD WITHOUT CONTRAST TECHNIQUE: Multiplanar, multiecho pulse sequences of the brain and surrounding structures were obtained without intravenous contrast. COMPARISON:  CT same day FINDINGS: Brain: 3.5 cm in diameter acute infarction affecting the superior cerebellum on the left. No other acute infarction. No significant mass effect. No sign of hemorrhage. No abnormal finding affecting the brainstem. Cerebral hemispheres show scattered foci of T2 and FLAIR signal in the deep white matter consistent with early small vessel ischemic change of a chronic nature. No mass lesion, hydrocephalus or extra-axial collection. Vascular: Major vessels at the base of  the brain show flow. Skull and upper cervical spine: Normal Sinuses/Orbits: Minimal mucosal inflammatory changes of the left maxillary sinus. Other: None IMPRESSION: 3.5 cm in diameter acute infarction in the superior cerebellum on the left. No evidence of significant swelling/mass effect or of any hemorrhage. Mild chronic small-vessel ischemic change of the cerebral hemispheric white matter. No large vessel vascular occlusion identified on this noncontrast study. Electronically Signed   By: Paulina Fusi M.D.   On: 11/12/2017 07:09   EKG: Independently reviewed. Showed a Sinus Rhythm with a rate of 73 with no evidence of ST Elevation or Depression on my interpretation.   Assessment/Plan Active Problems:   Cerebellar stroke (HCC)   Nausea and vomiting   Dizziness and giddiness   Hyperglycemia   Leukocytosis   Alcohol use  Acute Left Superior Cerebellum CVA -Admit to [facility] Telemetry -Head CT w/o Contrast showed symmetrical low-attenuation and possible mass-effect in left anterior cerebellum and cerebellar peduncle but no acute intracranial hemorrhage or mass-effect was identified on CT scan -MRI of the Brain w/o Contrast showed 0.5 cm acute infarction in the superior cerebellum on the left.  There is no evident significant swelling or mass-effect of any hemorrhage and there is mild chronic small vessel ischemic change of the cerebral hemispheric white matter and no large vessel vascular occlusion identified on this noncontrast study. -CTA of the Head and Neck done and showed The patient does not  show evidence of atherosclerotic vascular disease in general or of dissection. 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  -Check Transthoracic ECHOCardiogram -[facility] as patient is to evaluated by Stroke Team -Check FLP and HbA1c -PT/OT/SLP Evaluate and Treat -Neurochecks per protocol -Allow permissive  hypertension -Further Evaluation and Recommendations by Neurology -Place on ASA 325 mg po Daily -Check urinalysis and urine drug screen -Neurology Dr. Wilford CornerArora was consulted for further evaluation recommendations and will evaluate the [facility] -Stroke team to follow in the morning  Nausea/Vomiting/Dizziness 2/2 to Above -Supportive care with IV fluid normal saline at 50 mL's per hour -Continue antiemetics with Ondansetron 4 mg IV every 6 as needed for nausea vomiting -Neurology to evaluate along with PT and OT -Patient was given meclizine 25 mg p.o. once in the ED along with Metoclopramide 10 mg IV  Hyperglycemia -Blood sugar on admission was 142 -Likely reactive in the setting of CVA -Check Hemoglobin A1c in the a.m.  Leukocytosis -Mild in the setting of CVA -WBC was 10.8 on admission -Team to monitor for signs and symptoms of infection -Repeat CBC in a.m.  Hyperbilirubinemia -Unclear etiology at this time -Patient had a T bili of 1.9 on admission -We will continue to monitor and trend and repeat CMP in a.m.  History of Alcohol Abuse -Patient states he drinks 3-5 times a week -We will need to monitor carefully and placed on CIWA protocol with IV lorazepam if necessary -Patient states last drink was on Thursday  Obesity -Estimated body mass index is 32.28 kg/m as calculated from the following:   Height as of this encounter: 5\' 10"  (1.778 m).   Weight as of this encounter: 102.1 kg.  -Weight Loss Counseling Given   DVT prophylaxis: Enoxaparin 40 mg subcu every 24 Code Status: FULL CODE Family Communication: No family present at bedside Disposition Plan: Pending PT/OT/SLP evaluations and clearance from Neurology  Consults called: Neurology Dr. Wilford CornerArora  Admission status: Inpatient Telemetry   Severity of Illness: The appropriate patient status for this patient is INPATIENT. Inpatient status is judged to be reasonable and necessary in order to provide  the required intensity of service to ensure the patient's safety. The patient's presenting symptoms, physical exam findings, and initial radiographic and laboratory data in the context of their chronic comorbidities is felt to place them at high risk for further clinical deterioration. Furthermore, it is not anticipated that the patient will be medically stable for discharge from the hospital within 2 midnights of admission. The following factors support the patient status of inpatient.   " The patient's presenting symptoms include Nausea, Vomiting, Dizziness. " The worrisome physical exam findings include Vertigo. " The initial radiographic and laboratory data are worrisome because of Acute Cerebellar CVA. " The chronic co-morbidities include Alcohol Abuse; Hx of Heart Murmur.  * I certify that at the point of admission it is my clinical judgment that the patient will require inpatient hospital care spanning beyond 2 midnights from the point of admission due to high intensity of service, high risk for further deterioration and high frequency of surveillance required.Merlene Laughter*  John Mcbride, D.O. [facility] Pager [phone]  If 7PM-7AM, please contact night-coverage [email or web address] Password [email or web address]  11/12/2017, 9:16 AM

## 2017-11-12 NOTE — Evaluation (Signed)
Physical Therapy Evaluation Patient Details Name: John Mcbride MRN: [id number] DOB: Dec 07, 1965 Today's Date: 11/12/2017   History of Present Illness  Patient is a 52 y/o male presenting to the ED on 11/12/17 due to dizziness, N&V, and imbalance. Head CT w/o Contrast showed symmetrical low-attenuation and possible mass-effect in left anterior cerebellum and cerebellar peduncle. 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  PMH significant for heart murmur of unknown type, alcohol use.     Clinical Impression  John Mcbride is a pleasant 52 y.o male admitted with the above listed diagnosis. Patient reports that prior to admission he was independent with all aspects of mobility ans worked full time. Patient today requiring assist for transfers and mobility due to reduced coordination and instability with mobility. Increased dizziness noted with mobility, BP 141/80. Will recommend CIR at discharge to continue to maximize safe and independent mobility prior to return home.     Follow Up Recommendations CIR    Equipment Recommendations  Other (comment)(TBD)    Recommendations for Other Services Rehab consult     Precautions / Restrictions Precautions Precautions: (P) Fall Restrictions Weight Bearing Restrictions: No      Mobility  Bed Mobility Overal bed mobility: Needs Assistance Bed Mobility: Supine to Sit     Supine to sit: Min guard     General bed mobility comments: for safety and upright balance - increased dizziness with movement  Transfers Overall transfer level: Needs assistance Equipment used: 2 person hand held assist Transfers: Sit to/from Stand Sit to Stand: Min assist;+2 physical assistance         General transfer comment: for safety and stability - patient wanting to reach forward to sink, with verbal cueing required to push up from bedside  Ambulation/Gait Ambulation/Gait assistance: Min assist;Mod  assist;+2 physical assistance Gait Distance (Feet): 20 Feet Assistive device: 2 person hand held assist Gait Pattern/deviations: Step-to pattern;Step-through pattern;Decreased stride length;Staggering left;Staggering right Gait velocity: decreased   General Gait Details: very unsteady; weight bearing primarily thorugh L UE; increased dizziness with activity; very unsteady - unable to navigate straight path  Stairs            Wheelchair Mobility    Modified Rankin (Stroke Patients Only) Modified Rankin (Stroke Patients Only) Pre-Morbid Rankin Score: No symptoms Modified Rankin: Moderately severe disability     Balance Overall balance assessment: Needs assistance Sitting-balance support: Bilateral upper extremity supported;Feet supported Sitting balance-Leahy Scale: Fair     Standing balance support: Bilateral upper extremity supported;During functional activity Standing balance-Leahy Scale: Poor Standing balance comment: reliant on UE support to maintain upright balance                             Pertinent Vitals/Pain Pain Assessment: No/denies pain(7/10 dizziness)    Home Living Family/patient expects to be discharged to:: (P) Private residence Living Arrangements: Other (Comment)(roommate) Available Help at Discharge: Family;Friend(s);Available PRN/intermittently Type of Home: House Home Access: Stairs to enter Entrance Stairs-Rails: Left Entrance Stairs-Number of Steps: 3 Home Layout: One level Home Equipment: None      Prior Function Level of Independence: Independent         Comments: works full time - Physiological scientistairplane repair     Hand Dominance   Dominant Hand: Right    Extremity/Trunk Assessment   Upper Extremity Assessment Upper Extremity Assessment: Defer to OT evaluation    Lower Extremity Assessment Lower  Extremity Assessment: RLE deficits/detail;LLE deficits/detail RLE Deficits / Details: grossly 5/5 stength RLE Coordination:  WNL LLE Deficits / Details: grossly 5/5 stength LLE Coordination: decreased gross motor    Cervical / Trunk Assessment Cervical / Trunk Assessment: Normal  Communication   Communication: No difficulties  Cognition Arousal/Alertness: Awake/alert Behavior During Therapy: WFL for tasks assessed/performed Overall Cognitive Status: Within Functional Limits for tasks assessed                                        General Comments      Exercises     Assessment/Plan    PT Assessment Patient needs continued PT services  PT Problem List Decreased strength;Decreased range of motion;Decreased balance;Decreased mobility;Decreased knowledge of use of DME;Decreased coordination;Decreased safety awareness       PT Treatment Interventions DME instruction;Gait training;Stair training;Functional mobility training;Therapeutic exercise;Therapeutic activities;Balance training;Neuromuscular re-education;Patient/family education    PT Goals (Current goals can be found in the Care Plan section)  Acute Rehab PT Goals Patient Stated Goal: improve dizziness PT Goal Formulation: With patient Time For Goal Achievement: 11/26/17 Potential to Achieve Goals: Good    Frequency Min 4X/week   Barriers to discharge        Co-evaluation PT/OT/SLP Co-Evaluation/Treatment: Yes Reason for Co-Treatment: (P) Complexity of the patient's impairments (multi-system involvement);Necessary to address cognition/behavior during functional activity;For patient/therapist safety;To address functional/ADL transfers PT goals addressed during session: Mobility/safety with mobility;Balance OT goals addressed during session: (P) Proper use of Adaptive equipment and DME;Strengthening/ROM;ADL's and self-care       AM-PAC PT "6 Clicks" Daily Activity  Outcome Measure Difficulty turning over in bed (including adjusting bedclothes, sheets and blankets)?: A Little Difficulty moving from lying on back to  sitting on the side of the bed? : Unable Difficulty sitting down on and standing up from a chair with arms (e.g., wheelchair, bedside commode, etc,.)?: Unable Help needed moving to and from a bed to chair (including a wheelchair)?: A Little Help needed walking in hospital room?: A Lot Help needed climbing 3-5 steps with a railing? : A Lot 6 Click Score: 12    End of Session Equipment Utilized During Treatment: Gait belt Activity Tolerance: Patient tolerated treatment well Patient left: in chair;with call bell/phone within reach;with chair alarm set;with family/visitor present Nurse Communication: Mobility status PT Visit Diagnosis: Unsteadiness on feet (R26.81);Other abnormalities of gait and mobility (R26.89);Muscle weakness (generalized) (M62.81)    Time: [id number] PT Time Calculation (min) (ACUTE ONLY): 23 min   Charges:   PT Evaluation $PT Eval Moderate Complexity: 1 Mod         Kipp LaurenceStephanie R Aaron, PT, DPT 11/12/17 2:28 PM Pager: [phone]

## 2017-11-12 NOTE — ED Notes (Signed)
John Mcbride-pt's roommate-called to be updated on pt's status. Pt gave this Clinical research associatewriter verbal permission to release information to him.   John's phone number is [phone].

## 2017-11-13 ENCOUNTER — Inpatient Hospital Stay (HOSPITAL_COMMUNITY): Payer: BLUE CROSS/BLUE SHIELD

## 2017-11-13 DIAGNOSIS — D72820 Lymphocytosis (symptomatic): Secondary | ICD-10-CM

## 2017-11-13 DIAGNOSIS — I639 Cerebral infarction, unspecified: Secondary | ICD-10-CM

## 2017-11-13 LAB — CBC WITH DIFFERENTIAL/PLATELET
Abs Immature Granulocytes: 0 10*3/uL (ref 0.0–0.1)
BASOS ABS: 0 10*3/uL (ref 0.0–0.1)
Basophils Relative: 1 %
EOS ABS: 0.1 10*3/uL (ref 0.0–0.7)
Eosinophils Relative: 1 %
HCT: 43.2 % (ref 39.0–52.0)
Hemoglobin: 14.7 g/dL (ref 13.0–17.0)
Immature Granulocytes: 0 %
Lymphocytes Relative: 21 %
Lymphs Abs: 1.7 10*3/uL (ref 0.7–4.0)
MCH: 28 pg (ref 26.0–34.0)
MCHC: 34 g/dL (ref 30.0–36.0)
MCV: 82.3 fL (ref 78.0–100.0)
MONO ABS: 0.8 10*3/uL (ref 0.1–1.0)
Monocytes Relative: 10 %
Neutro Abs: 5.3 10*3/uL (ref 1.7–7.7)
Neutrophils Relative %: 67 %
Platelets: 273 10*3/uL (ref 150–400)
RBC: 5.25 MIL/uL (ref 4.22–5.81)
RDW: 12.8 % (ref 11.5–15.5)
WBC: 7.9 10*3/uL (ref 4.0–10.5)

## 2017-11-13 LAB — COMPREHENSIVE METABOLIC PANEL
ALT: 21 U/L (ref 0–44)
ANION GAP: 6 (ref 5–15)
AST: 16 U/L (ref 15–41)
Albumin: 3.7 g/dL (ref 3.5–5.0)
Alkaline Phosphatase: 42 U/L (ref 38–126)
BILIRUBIN TOTAL: 2.7 mg/dL — AB (ref 0.3–1.2)
BUN: 8 mg/dL (ref 6–20)
CO2: 26 mmol/L (ref 22–32)
Calcium: 8.9 mg/dL (ref 8.9–10.3)
Chloride: 107 mmol/L (ref 98–111)
Creatinine, Ser: 0.97 mg/dL (ref 0.61–1.24)
GFR calc non Af Amer: >60 mL/min (ref >60)
Glucose, Bld: 106 mg/dL — ABNORMAL HIGH (ref 70–99)
POTASSIUM: 3.5 mmol/L (ref 3.5–5.1)
SODIUM: 139 mmol/L (ref 135–145)
Total Protein: 6.4 g/dL — ABNORMAL LOW (ref 6.5–8.1)

## 2017-11-13 LAB — LIPID PANEL
CHOL/HDL RATIO: 4.1 ratio
Cholesterol: 139 mg/dL (ref 0–200)
HDL: 34 mg/dL — ABNORMAL LOW (ref >40)
LDL CALC: 85 mg/dL (ref 0–99)
TRIGLYCERIDES: 101 mg/dL (ref <150)
VLDL: 20 mg/dL (ref 0–40)

## 2017-11-13 LAB — HIV ANTIBODY (ROUTINE TESTING W REFLEX): HIV SCREEN 4TH GENERATION: NONREACTIVE

## 2017-11-13 LAB — PHOSPHORUS: PHOSPHORUS: 3.5 mg/dL (ref 2.5–4.6)

## 2017-11-13 LAB — HEMOGLOBIN A1C
Hgb A1c MFr Bld: 5 % (ref 4.8–5.6)
Mean Plasma Glucose: 96.8 mg/dL

## 2017-11-13 LAB — MAGNESIUM: MAGNESIUM: 2.3 mg/dL (ref 1.7–2.4)

## 2017-11-13 MED ORDER — ASPIRIN EC 81 MG PO TBEC
81.0000 mg | DELAYED_RELEASE_TABLET | Freq: Every day | ORAL | Status: DC
  Administered 2017-11-14: 81 mg via ORAL
  Filled 2017-11-13: qty 1

## 2017-11-13 MED ORDER — ATORVASTATIN CALCIUM 10 MG PO TABS
20.0000 mg | ORAL_TABLET | Freq: Every day | ORAL | Status: DC
  Administered 2017-11-13: 20 mg via ORAL
  Filled 2017-11-13: qty 2

## 2017-11-13 MED ORDER — CLOPIDOGREL BISULFATE 75 MG PO TABS
75.0000 mg | ORAL_TABLET | Freq: Every day | ORAL | Status: DC
  Administered 2017-11-13 – 2017-11-14 (×2): 75 mg via ORAL
  Filled 2017-11-13 (×2): qty 1

## 2017-11-13 NOTE — Progress Notes (Signed)
PT Cancellation Note  Patient Details Name: John Mcbride MRN: [id number] DOB: 01-Apr-1966   Cancelled Treatment:     Patient off unit for test/procedure. PT will continue to follow acutely.    Derek MoundKellyn R Tashe Purdon Havoc [place], PTA Pager: [phone]   11/13/2017, 10:18 AM

## 2017-11-13 NOTE — Progress Notes (Signed)
Inpatient Rehabilitation Admissions Coordinator  I met with patient at bedside to discuss goals and expectations of an inpt rehab admit. He is in agreement. I will begin insurance authorization with [employer] for a possible inpt rehab admit when medical work up is complete.  Danne Baxter, RN, MSN Rehab Admissions Coordinator [phone] 11/13/2017 1:20 PM

## 2017-11-13 NOTE — Progress Notes (Signed)
PROGRESS NOTE    John SidleDavid G Mcbride  ZOX:[id number] DOB: 1965/07/20 DOA: 11/12/2017 PCP: Patient, No Pcp Per    Brief Narrative:  52 y.o. male with medical history significant of a heart murmur of unknown type, alcohol use, and other comorbidities who has not seen a physician in years presenting with a chief complaint of dizziness associated with nausea and vomiting.  Patient states that he woke up this morning around midnight and was significantly dizzy and nauseous.  Felt as if he was on a "boat that was rocking".  States that he was unable to ambulate because of his dizziness symptoms.  Patient denied any headaches, blurred vision double vision numbness or tingling in extremities however he did state that he felt weak on his left side.  Patient states the dizziness was significant enough for him to call EMS and while he was in the EMS he had episodes of nausea vomiting which is improved with IV Zofran.  This is never happened to him in the past and states that he has no other real medical issues.  Bloody.  Patient denied any chest pain, shortness breath, burning or discomfort in the urine, and denies any abdominal pain or diarrhea or constipation.  Up and found to have an acute cerebellar CVA and [facility] was called to admit this patient neurology to [facility].  ED Course: Had a stroke alert CT scan of the head along with basic blood work.  Neurology Dr. Laurence SlateAroor was consulted who recommended further work-up and admission to [facility].  Metoclopramide along with meclizine for his dizziness.  Assessment & Plan:   Active Problems:   Cerebellar stroke (HCC)   Nausea and vomiting   Dizziness and giddiness   Hyperglycemia   Leukocytosis   Alcohol use  Acute Left Superior Cerebellum CVA -Head CT w/o Contrast showed symmetrical low-attenuation and possible mass-effect in left anterior cerebellum and cerebellar peduncle but no acute intracranial hemorrhage or mass-effect was identified on CT  scan -MRI of the Brain w/o Contrast showed 0.5 cm acute infarction in the superior cerebellum on the left.  There is no evident significant swelling or mass-effect of any hemorrhage and there is mild chronic small vessel ischemic change of the cerebral hemispheric white matter and no large vessel vascular occlusion identified on this noncontrast study. -CTA of the Head and Neck reviewed with findings of occlusion of the left superior cerebellar artery just beyond its origin.  -TTE unremarkable with normal LVEF -Neurology following. Recommendation for follow up TEE -Therapy recommendations for CIR, consulted -Discussed case with Neurology -Recommendations for ASA 81mg  and Plavix daily x 3 weeks, then ASA alone  Nausea/Vomiting/Dizziness 2/2 to Above -Patient has been continued with IV fluid normal saline at 50 mL's per hour -Continue antiemetics with Ondansetron 4 mg IV every 6 as needed for nausea vomiting -Patient was given meclizine 25 mg p.o. once in the ED along with Metoclopramide 10 mg IV -Stable this AM  Hyperglycemia -Blood sugar on admission was 142 -Likely reactive in the setting of CVA -a1c noted to be 5.0  Leukocytosis -Mild in the setting of CVA -WBC was 10.8 on admission -Suspect reactive in setting of CVA -CBC this am reviewed, normalized  Hyperbilirubinemia -Unclear etiology at this time -Patient had a T bili of 1.9 on admission -Tbili up to 2.7 -Hx of ETOH abuse noted -Will request liver US. Pt tolerating diet  History of Alcohol Abuse -Patient states he drinks 3-5 times a week -Continue CIWA as needed  Obesity -Estimated body mass index is 32.28 kg/m as calculated from the following:   Height as of this encounter: 5\' 10"  (1.778 m).   Weight as of this encounter: 102.1 kg.  -Pt has been counseled on wt loss   DVT prophylaxis: SCD's Code Status: Full Family Communication: Pt in room, family not at bedside Disposition Plan: Uncertain at this  time, CIR possible  Consultants:   Neurology  Procedures:     Antimicrobials: Anti-infectives (From admission, onward)   None       Subjective: Still noting unsteadiness.  Objective: Vitals:   11/12/17 2329 11/13/17 0357 11/13/17 0734 11/13/17 1128  BP: 123/83 125/79 119/82 (!) 127/91  Pulse: 84 (!) 56 85 79  Resp: 20 20 16 16   Temp: 98.4 F (36.9 C) 98.1 F (36.7 C) 98 F (36.7 C) 98.3 F (36.8 C)  TempSrc: Oral Oral  Oral  SpO2: 97% 99% 96% 98%  Weight:  104.7 kg    Height:        Intake/Output Summary (Last 24 hours) at 11/13/2017 1529 Last data filed at 11/13/2017 0231 Gross per 24 hour  Intake 480 ml  Output 1100 ml  Net -620 ml   Filed Weights   11/12/17 0243 11/13/17 0357  Weight: 102.1 kg 104.7 kg    Examination:  General exam: Appears calm and comfortable  Respiratory system: Clear to auscultation. Respiratory effort normal. Cardiovascular system: S1 & S2 heard, RRR Gastrointestinal system: Abdomen is nondistended, soft and nontender. No organomegaly or masses felt. Normal bowel sounds heard. Central nervous system: Alert and oriented. dizziness. Extremities: Symmetric 5 x 5 power. Skin: No rashes, lesions  Psychiatry: Judgement and insight appear normal. Mood & affect appropriate.   Data Reviewed: I have personally reviewed following labs and imaging studies  CBC: Recent Labs  Lab 11/12/17 0728 11/12/17 0738 11/13/17 0415  WBC 10.8*  --  7.9  NEUTROABS 9.7*  --  5.3  HGB 14.9 14.3 14.7  HCT 42.4 42.0 43.2  MCV 81.2  --  82.3  PLT 280  --  273   Basic Metabolic Panel: Recent Labs  Lab 11/12/17 0728 11/12/17 0738 11/13/17 0415  NA 138 138 139  K 3.8 3.8 3.5  CL 104 103 107  CO2 24  --  26  GLUCOSE 146* 142* 106*  BUN 15 14 8   CREATININE 0.96 0.90 0.97  CALCIUM 8.9  --  8.9  MG  --   --  2.3  PHOS  --   --  3.5   GFR: Estimated Creatinine Clearance: 108 mL/min (by C-G formula based on SCr of 0.97 mg/dL). Liver Function  Tests: Recent Labs  Lab 11/12/17 0728 11/13/17 0415  AST 25 16  ALT 26 21  ALKPHOS 43 42  BILITOT 1.9* 2.7*  PROT 7.2 6.4*  ALBUMIN 4.2 3.7   No results for input(s): LIPASE, AMYLASE in the last 168 hours. No results for input(s): AMMONIA in the last 168 hours. Coagulation Profile: Recent Labs  Lab 11/12/17 0728  INR 0.96   Cardiac Enzymes: No results for input(s): CKTOTAL, CKMB, CKMBINDEX, TROPONINI in the last 168 hours. BNP (last 3 results) No results for input(s): PROBNP in the last 8760 hours. HbA1C: Recent Labs    11/13/17 0415  HGBA1C 5.0   CBG: No results for input(s): GLUCAP in the last 168 hours. Lipid Profile: Recent Labs    11/13/17 0415  CHOL 139  HDL 34*  LDLCALC 85  TRIG 161  CHOLHDL 4.1  Thyroid Function Tests: No results for input(s): TSH, T4TOTAL, FREET4, T3FREE, THYROIDAB in the last 72 hours. Anemia Panel: No results for input(s): VITAMINB12, FOLATE, FERRITIN, TIBC, IRON, RETICCTPCT in the last 72 hours. Sepsis Labs: No results for input(s): PROCALCITON, LATICACIDVEN in the last 168 hours.  No results found for this or any previous visit (from the past 240 hour(s)).   Radiology Studies: Ct Angio Head W Or Wo Contrast  Result Date: 11/12/2017 CLINICAL DATA:  Acute left cerebellar infarction. Dizziness, nausea and vomiting. EXAM: CT ANGIOGRAPHY HEAD AND NECK TECHNIQUE: Multidetector CT imaging of the head and neck was performed using the standard protocol during bolus administration of intravenous contrast. Multiplanar CT image reconstructions and MIPs were obtained to evaluate the vascular anatomy. Carotid stenosis measurements (when applicable) are obtained utilizing NASCET criteria, using the distal internal carotid diameter as the denominator. CONTRAST:  80mL ISOVUE-370 IOPAMIDOL (ISOVUE-370) INJECTION 76% COMPARISON:  CT and MR study same day. FINDINGS: CTA NECK FINDINGS Aortic arch: Normal Right carotid system: Common carotid artery  widely patent to the bifurcation. The carotid bifurcation is normal without soft or calcified plaque. Cervical internal carotid artery is tortuous but widely patent. Left carotid system: Common carotid artery widely patent to the bifurcation. Carotid bifurcation is normal without soft or calcified plaque. Cervical ICA is tortuous but widely patent. Vertebral arteries: Both subclavian arteries appear normal. Both vertebral artery origins are widely patent. The vertebral arteries are approximately equal in size an widely patent through the cervical region to the foramen magnum. Skeleton: Ordinary cervical spondylosis. Other neck: No mass or lymphadenopathy. Upper chest: Normal Review of the MIP images confirms the above findings CTA HEAD FINDINGS Anterior circulation: Both internal carotid arteries are widely patent through the skull base and siphon regions. The anterior and middle cerebral vessels are patent without proximal stenosis, aneurysm or vascular malformation. Posterior circulation: Both vertebral arteries are widely patent at and through the foramen magnum to the basilar. No basilar stenosis. There are diminutive posterior inferior cerebellar arteries. There are bilateral anterior inferior cerebellar arteries which are patent. Right superior cerebellar artery is normal. Left superior cerebellar artery is occluded just beyond its origin. Both posterior inferior cerebellar arteries are patent and normal. Venous sinuses: Patent and normal. Anatomic variants: None significant. Delayed phase: No abnormal enhancement. Review of the MIP images confirms the above findings IMPRESSION: The patient does not show evidence of atherosclerotic vascular disease in general or of dissection. 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 Electronically Signed   By: Paulina FusiMark  Shogry M.D.   On: 11/12/2017 08:58   Ct Head Wo Contrast  Result Date: 11/12/2017 CLINICAL DATA:  Follow up  stroke. EXAM: CT HEAD WITHOUT CONTRAST TECHNIQUE: Contiguous axial images were obtained from the base of the skull through the vertex without intravenous contrast. COMPARISON:  CT HEAD and MRI head November 12, 2017 FINDINGS: BRAIN: LEFT superior cerebellar hypodensity corresponding to known acute infarct. Regional mass effect with slight mass effect on fourth ventricle and cerebral aqueduct. No hydrocephalus. Borderline parenchymal brain volume loss for age. Patchy supratentorial white matter hypodensities. No intraparenchymal hemorrhage, midline shift. No acute large vascular territory infarcts. Basal cisterns are patent. VASCULAR: Mild calcific atherosclerosis carotid siphon. SKULL/SOFT TISSUES: No skull fracture. No significant soft tissue swelling. ORBITS/SINUSES: The included ocular globes and orbital contents are normal.Trace paranasal sinus mucosal thickening. Mastoid air cells are well aerated. OTHER: None. IMPRESSION: 1. Evolving LEFT cerebellar/SCA territory nonhemorrhagic infarct. Regional mass effect without hydrocephalus.  2. Borderline parenchymal brain volume loss for age. 3. Mild chronic small vessel ischemic changes. Electronically Signed   By: Awilda Metro M.D.   On: 11/12/2017 23:06   Ct Head Wo Contrast  Result Date: 11/12/2017 CLINICAL DATA:  Dizziness. Nausea and vomiting. Hydraulic fluid in the ear at work. EXAM: CT HEAD WITHOUT CONTRAST TECHNIQUE: Contiguous axial images were obtained from the base of the skull through the vertex without intravenous contrast. COMPARISON:  None. FINDINGS: Brain: There is suggestion of asymmetrical low-attenuation and possible mass effect in the left anterior cerebellum and cerebellar peduncle. This could represent an area of acute infarct or possibly a mass lesion. MRI is suggested for further evaluation. Intracranial contents are otherwise unremarkable. No ventricular dilatation. No abnormal extra-axial fluid collections. Gray-white matter junctions  are distinct. Basal cisterns are not effaced. No acute intracranial hemorrhage. Vascular: No hyperdense vessel or unexpected calcification. Skull: Normal. Negative for fracture or focal lesion. Sinuses/Orbits: Mild mucosal thickening in the paranasal sinuses. No acute air-fluid levels. Mastoid air cells and middle ear cavities are clear. Other: None. IMPRESSION: Suggestion of asymmetrical low-attenuation and possible mass effect in the left anterior cerebellum and cerebellar peduncle. MRI is recommended for further evaluation. No acute intracranial hemorrhage or mass effect. Electronically Signed   By: Burman Nieves M.D.   On: 11/12/2017 05:42   Ct Angio Neck W And/or Wo Contrast  Result Date: 11/12/2017 CLINICAL DATA:  Acute left cerebellar infarction. Dizziness, nausea and vomiting. EXAM: CT ANGIOGRAPHY HEAD AND NECK TECHNIQUE: Multidetector CT imaging of the head and neck was performed using the standard protocol during bolus administration of intravenous contrast. Multiplanar CT image reconstructions and MIPs were obtained to evaluate the vascular anatomy. Carotid stenosis measurements (when applicable) are obtained utilizing NASCET criteria, using the distal internal carotid diameter as the denominator. CONTRAST:  80mL ISOVUE-370 IOPAMIDOL (ISOVUE-370) INJECTION 76% COMPARISON:  CT and MR study same day. FINDINGS: CTA NECK FINDINGS Aortic arch: Normal Right carotid system: Common carotid artery widely patent to the bifurcation. The carotid bifurcation is normal without soft or calcified plaque. Cervical internal carotid artery is tortuous but widely patent. Left carotid system: Common carotid artery widely patent to the bifurcation. Carotid bifurcation is normal without soft or calcified plaque. Cervical ICA is tortuous but widely patent. Vertebral arteries: Both subclavian arteries appear normal. Both vertebral artery origins are widely patent. The vertebral arteries are approximately equal in size an  widely patent through the cervical region to the foramen magnum. Skeleton: Ordinary cervical spondylosis. Other neck: No mass or lymphadenopathy. Upper chest: Normal Review of the MIP images confirms the above findings CTA HEAD FINDINGS Anterior circulation: Both internal carotid arteries are widely patent through the skull base and siphon regions. The anterior and middle cerebral vessels are patent without proximal stenosis, aneurysm or vascular malformation. Posterior circulation: Both vertebral arteries are widely patent at and through the foramen magnum to the basilar. No basilar stenosis. There are diminutive posterior inferior cerebellar arteries. There are bilateral anterior inferior cerebellar arteries which are patent. Right superior cerebellar artery is normal. Left superior cerebellar artery is occluded just beyond its origin. Both posterior inferior cerebellar arteries are patent and normal. Venous sinuses: Patent and normal. Anatomic variants: None significant. Delayed phase: No abnormal enhancement. Review of the MIP images confirms the above findings IMPRESSION: The patient does not show evidence of atherosclerotic vascular disease in general or of dissection. 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 Electronically Signed  By: Paulina Fusi M.D.   On: 11/12/2017 08:58   Mr Brain Wo Contrast  Result Date: 11/12/2017 CLINICAL DATA:  Acute presentation with dizziness, nausea and vomiting. Hydraulic fluid in the ear at work. EXAM: MRI HEAD WITHOUT CONTRAST TECHNIQUE: Multiplanar, multiecho pulse sequences of the brain and surrounding structures were obtained without intravenous contrast. COMPARISON:  CT same day FINDINGS: Brain: 3.5 cm in diameter acute infarction affecting the superior cerebellum on the left. No other acute infarction. No significant mass effect. No sign of hemorrhage. No abnormal finding affecting the brainstem. Cerebral hemispheres  show scattered foci of T2 and FLAIR signal in the deep white matter consistent with early small vessel ischemic change of a chronic nature. No mass lesion, hydrocephalus or extra-axial collection. Vascular: Major vessels at the base of the brain show flow. Skull and upper cervical spine: Normal Sinuses/Orbits: Minimal mucosal inflammatory changes of the left maxillary sinus. Other: None IMPRESSION: 3.5 cm in diameter acute infarction in the superior cerebellum on the left. No evidence of significant swelling/mass effect or of any hemorrhage. Mild chronic small-vessel ischemic change of the cerebral hemispheric white matter. No large vessel vascular occlusion identified on this noncontrast study. Electronically Signed   By: Paulina Fusi M.D.   On: 11/12/2017 07:09    Scheduled Meds: . [START ON 11/14/2017] aspirin EC  81 mg Oral Daily  . atorvastatin  20 mg Oral q1800  . clopidogrel  75 mg Oral Daily  . enoxaparin (LOVENOX) injection  40 mg Subcutaneous Q24H   Continuous Infusions:   LOS: 1 day   Rickey Barbara, MD [facility] Pager [phone]  If 7PM-7AM, please contact night-coverage [email or web address] Password [facility] 11/13/2017, 3:29 PM

## 2017-11-13 NOTE — Plan of Care (Signed)
Patient stable, discussed POC with patient, agreeable with plan, denies question/concerns at this time.  

## 2017-11-13 NOTE — PMR Pre-admission (Addendum)
PMR Admission Coordinator Pre-Admission Assessment  Patient: John SidleDavid G Vastine is an 52 y.o., male MRN: [id number] DOB: 1965-11-04 Height: 5\' 10"  (177.8 cm) Weight: 105.2 kg              Insurance Information HMO:     PPO: yes     PCP:      IPA:      80/20:      OTHER:  PRIMARY: [employer] [place]      Policy#: [id number]      Subscriber: pt CM Name: Aletha Halimmy Phillips      Phone#: [phone]     Fax#: [phone] Pre-Cert#: [id number] approved until *      Employer: [employer] Benefits:  Phone #: [phone]     Name: 11/13/17 Eff. Date: 04/01/2017     Deduct: $3000      Out of Pocket Max: $4500      Life Max: none CIR: 80%      SNF: 80% 60 days Outpatient: 80%     Co-Pay: 30 visits combined PT and OT; 30 visits SLP Home Health: 80%      Co-Pay: visits per medical neccesity with authorization DME: 80%     Co-Pay: 20% Providers: in network  SECONDARY: none     Medicaid Application Date:       Case Manager:  Disability Application Date:       Case Worker:   Emergency Contact Information Contact Information    Name Relation Home Work Mobile   Schlagel,Phyllis Mother [phone]       Current Medical History  Patient Admitting Diagnosis: left cerebellar infarct  History of Present Illness: HPI: Milda SmartDavid G. Harlin HeysRollins is a 52 year old right-handed male with history of heart murmur, alcohol use on no prescription medications. Presented 11/12/2017 with nausea, vomiting as well as dizziness and incoordination.  Cranial CT scan showed suggestion of asymmetrical low-attenuation and possible mass-effect in the left anterior cerebellum and cerebellar peduncle.  MRI showed a 3.5 cm diameter acute infarction in the superior cerebellum on the left.  No evidence of mass-effect or hemorrhage.  CT angiogram of head and neck with no large vessel occlusion.  Patient did not receive TPA.  Echocardiogram with ejection fraction of 65% no wall motion abnormalities.  Neurology consulted maintained on aspirin for CVA prophylaxis.   Subcutaneous.  Lovenox for DVT prophylaxis.  Venous Doppler studies lower extremities negative.  Tolerating a regular diet. TEE planned for today 8/16 with results pending and possible LOOP placement pending TEE results.     Complete NIHSS TOTAL: 0    Past Medical History  Past Medical History:  Diagnosis Date  . Heart murmur     Family History  family history includes Cancer in his father and mother; Heart disease in his father.  Prior Rehab/Hospitalizations:  Has the patient had major surgery during 100 days prior to admission? No  Current Medications   Current Facility-Administered Medications:  .  0.9 %  sodium chloride infusion, , Intravenous, Continuous, Kroeger, Krista M., PA-C .  [MAR Hold] acetaminophen (TYLENOL) tablet 650 mg, 650 mg, Oral, Q4H PRN **OR** [MAR Hold] acetaminophen (TYLENOL) solution 650 mg, 650 mg, Per Tube, Q4H PRN **OR** [MAR Hold] acetaminophen (TYLENOL) suppository 650 mg, 650 mg, Rectal, Q4H PRN, Sheikh, Omair Latif, DO .  [MAR Hold] aspirin EC tablet 81 mg, 81 mg, Oral, Daily, Biby, Sharon L, NP, 81 mg at 11/14/17 0939 .  [MAR Hold] atorvastatin (LIPITOR) tablet 20 mg, 20 mg, Oral, q1800, Marvel PlanXu, Jindong, MD, 20  mg at 11/13/17 1923 .  [MAR Hold] clopidogrel (PLAVIX) tablet 75 mg, 75 mg, Oral, Daily, Biby, Sharon L, NP, 75 mg at 11/14/17 0939 .  [MAR Hold] enoxaparin (LOVENOX) injection 40 mg, 40 mg, Subcutaneous, Q24H, Sheikh, Omair Latif, DO, 40 mg at 11/13/17 1527 .  [MAR Hold] ondansetron (ZOFRAN) injection 4 mg, 4 mg, Intravenous, Q6H PRN, Marguerita MerlesSheikh, Omair Latif, DO, 4 mg at 11/12/17 2129 .  [MAR Hold] senna-docusate (Senokot-S) tablet 1 tablet, 1 tablet, Oral, QHS PRN, Merlene LaughterSheikh, Omair Latif, DO  Patients Current Diet:  Diet Order            Diet NPO time specified Except for: Sips with Meds  Diet effective midnight              Precautions / Restrictions Precautions Precautions: Fall Restrictions Weight Bearing Restrictions: No   Has the  patient had 2 or more falls or a fall with injury in the past year?No  Prior Activity Level Community (5-7x/wk): Independent, driving, working, Engineer, technical salesairplane mechanic  Home Assistive Devices / Equipment Home Assistive Devices/Equipment: None Home Equipment: None  Prior Device Use: Indicate devices/aids used by the patient prior to current illness, exacerbation or injury? None of the above  Prior Functional Level Prior Function Level of Independence: Independent Comments: works full time - Scientific laboratory technicianairplane repair  Self Care: Did the patient need help bathing, dressing, using the toilet or eating?  Independent  Indoor Mobility: Did the patient need assistance with walking from room to room (with or without device)? Independent  Stairs: Did the patient need assistance with internal or external stairs (with or without device)? Independent  Functional Cognition: Did the patient need help planning regular tasks such as shopping or remembering to take medications? Independent  Current Functional Level Cognition  Overall Cognitive Status: Within Functional Limits for tasks assessed Orientation Level: Oriented X4    Extremity Assessment (includes Sensation/Coordination)  Upper Extremity Assessment: LUE deficits/detail LUE Deficits / Details: decr grasp 4 out 5 , ataxic movement LUE Sensation: decreased proprioception LUE Coordination: decreased gross motor  Lower Extremity Assessment: Defer to PT evaluation RLE Deficits / Details: grossly 5/5 stength RLE Coordination: WNL LLE Deficits / Details: grossly 5/5 stength LLE Coordination: decreased gross motor    ADLs  Overall ADL's : Needs assistance/impaired Eating/Feeding: Set up, Sitting Eating/Feeding Details (indicate cue type and reason): pt eating with R hand only with packages pre opened Grooming: Wash/dry hands, Standing, Moderate assistance Grooming Details (indicate cue type and reason): requires total+2 person (A) for balance and  progress to leaning on sink for mod (A) Upper Body Bathing: Minimal assistance Lower Body Bathing: Minimal assistance Lower Body Dressing: Moderate assistance Lower Body Dressing Details (indicate cue type and reason): pt able to don socks supine and shoes by pushing feet into shoes. pt able to tie shoes with incr time and very dizzy after leaning over Toilet Transfer: +2 for physical assistance, +2 for safety/equipment, Moderate assistance Functional mobility during ADLs: +2 for physical assistance, +2 for safety/equipment, Minimal assistance General ADL Comments: pt requires (A) for basic transfer +2 (A)    Mobility  Overal bed mobility: Needs Assistance Bed Mobility: Supine to Sit Supine to sit: Min guard General bed mobility comments: use of rail     Transfers  Overall transfer level: Needs assistance Equipment used: Rolling walker (2 wheeled) Transfers: Sit to/from Stand Sit to Stand: Min assist, +2 physical assistance General transfer comment: assistance to power up into standing and for balance    Ambulation /  Gait / Stairs / Wheelchair Mobility  Ambulation/Gait Ambulation/Gait assistance: Mod assist, +2 safety/equipment Gait Distance (Feet): 100 Feet Assistive device: Rolling walker (2 wheeled) Gait Pattern/deviations: Step-through pattern, Decreased stride length, Staggering left, Staggering right, Ataxic General Gait Details: cues for cadence; pt has difficulty controlling gait speed; intially gait training without AD and pt continues to be very unsteady and ataxic and reaching for objects to hold onto despite HHA; RW used for increased stability and pt required mod A for balance and safety with RW; +2 for safety Gait velocity: decreased    Posture / Balance Balance Overall balance assessment: Needs assistance Sitting-balance support: Bilateral upper extremity supported, Feet supported Sitting balance-Leahy Scale: Fair Standing balance support: Bilateral upper extremity  supported, During functional activity Standing balance-Leahy Scale: Poor Standing balance comment: pt able to static stand briefly without UE support     Special needs/care consideration BiPAP/CPAP  N/a CPM n/a Continuous Drip IV n/a Dialysis  N/a Life Vest n/a Oxygen n/a Special Bed n/a Trach Size n/a Wound Vac n/a Skin intact Bowel mgmt: continent; LBM  11/10/2017 Bladder mgmt: continent Diabetic mgmt n/a   Previous Home Environment Living Arrangements: Other relatives(lives with room mate, Neldon Newport)  Lives With: Friend(s) Available Help at Discharge: Family, Friend(s), Available PRN/intermittently Type of Home: House Home Layout: One level Home Access: Stairs to enter Entrance Stairs-Rails: Left Entrance Stairs-Number of Steps: 3 Bathroom Shower/Tub: Engineer, manufacturing systems: Standard Bathroom Accessibility: Yes How Accessible: Accessible via walker Home Care Services: No Additional Comments: lives with best friend "Jonny Ruiz" - mom/ dad live down the street and are retired. pt works on air planes for a living  Discharge Living Setting Plans for Discharge Living Setting: Patient's home, House Type of Home at Discharge: House Discharge Home Layout: One level Discharge Home Access: Stairs to enter Entrance Stairs-Rails: Left Entrance Stairs-Number of Steps: 3 Discharge Bathroom Shower/Tub: Tub/shower unit, Curtain Discharge Bathroom Toilet: Standard Discharge Bathroom Accessibility: Yes How Accessible: Accessible via walker Does the patient have any problems obtaining your medications?: No  Social/Family/Support Systems Patient Roles: (employee) Contact Information: Jamesetta So, Mom Anticipated Caregiver: Parents and friend, Jonny Ruiz Anticipated Industrial/product designer Information: see above Ability/Limitations of Caregiver: Parents live [address] the [address]. He may go home to his house or go stay with his parents Caregiver Availability: Intermittent Discharge Plan Discussed  with Primary Caregiver: Yes Is Caregiver In Agreement with Plan?: Yes Does Caregiver/Family have Issues with Lodging/Transportation while Pt is in Rehab?: No  Goals/Additional Needs Patient/Family Goal for Rehab: Mod I with PT and OT Expected length of stay: ELOS 11 to 15 days Pt/Family Agrees to Admission and willing to participate: Yes Program Orientation Provided & Reviewed with Pt/Caregiver Including Roles  & Responsibilities: Yes  Decrease burden of Care through IP rehab admission: n/a  Possible need for SNF placement upon discharge: not anticipated  Patient Condition: This patient's condition remains as documented in the consult dated 11/13/2017, in which the Rehabilitation Physician determined and documented that the patient's condition is appropriate for intensive rehabilitative care in an inpatient rehabilitation facility. Will admit to inpatient rehab today.  Preadmission Screen Completed By:  Clois Dupes, 11/14/2017 11:58 AM ______________________________________________________________________   Discussed status with Dr. Riley Kill on 11/14/2017 at 11/27/17 and received telephone approval for admission today.  Admission Coordinator:  Clois Dupes, time 1610 Date 11/14/2017

## 2017-11-13 NOTE — Progress Notes (Signed)
    [facility] has been requested to perform a transesophageal echocardiogram on 11/14/17 for stroke evaluation.  After careful review of history and examination, the risks and benefits of transesophageal echocardiogram have been explained including risks of esophageal damage, perforation (1:10,000 risk), bleeding, pharyngeal hematoma as well as other potential complications associated with conscious sedation including aspiration, arrhythmia, respiratory failure and death. Alternatives to treatment were discussed, questions were answered. Patient is willing to proceed.   TEE scheduled for 11/14/17 at 1:00pm with Dr. Leitha Schulleross Krista Kroeger, PA-C 11/13/2017 4:59 PM

## 2017-11-13 NOTE — Progress Notes (Signed)
Physical Therapy Treatment Patient Details Name: John Mcbride MRN: [id number] DOB: 1965/05/16 Today's Date: 11/13/2017    History of Present Illness Patient is a 52 y/o male presenting to the ED on 11/12/17 due to dizziness, N&V, and imbalance. Head CT w/o Contrast showed symmetrical low-attenuation and possible mass-effect in left anterior cerebellum and cerebellar peduncle. 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  PMH significant for heart murmur of unknown type, alcohol use.     PT Comments    Patient seen for mobility progression. Pt continues to present with ataxic gait and difficulty controlling gait speed requiring min/mod A (+2 for safety) with use of RW.  Pt c/o of dizziness however reports it has improved from yesterday and no c/o nausea during session. Pt will continue to benefit for further skilled PT services to maximize independence and safety with mobility. Current plan remains appropriate.   Follow Up Recommendations  CIR     Equipment Recommendations  Other (comment)(TBD)    Recommendations for Other Services Rehab consult     Precautions / Restrictions Precautions Precautions: Fall    Mobility  Bed Mobility Overal bed mobility: Needs Assistance Bed Mobility: Supine to Sit     Supine to sit: Min guard     General bed mobility comments: for safety  Transfers Overall transfer level: Needs assistance Equipment used: Rolling walker (2 wheeled) Transfers: Sit to/from Stand Sit to Stand: Min assist;+2 physical assistance         General transfer comment: assistance to power up into standing and for balance  Ambulation/Gait Ambulation/Gait assistance: Mod assist;+2 safety/equipment Gait Distance (Feet): 100 Feet Assistive device: Rolling walker (2 wheeled) Gait Pattern/deviations: Step-through pattern;Decreased stride length;Staggering left;Staggering right;Ataxic Gait velocity: decreased    General Gait Details: cues for cadence; pt has difficulty controlling gait speed; intially gait training without AD and pt continues to be very unsteady and ataxic and reaching for objects to hold onto despite HHA; RW used for increased stability and pt required mod A for balance and safety with RW; +2 for safety   Stairs             Wheelchair Mobility    Modified Rankin (Stroke Patients Only) Modified Rankin (Stroke Patients Only) Pre-Morbid Rankin Score: No symptoms Modified Rankin: Moderately severe disability     Balance Overall balance assessment: Needs assistance Sitting-balance support: Bilateral upper extremity supported;Feet supported Sitting balance-Leahy Scale: Fair     Standing balance support: Bilateral upper extremity supported;During functional activity Standing balance-Leahy Scale: Poor Standing balance comment: pt able to static stand briefly without UE support                             Cognition Arousal/Alertness: Awake/alert Behavior During Therapy: WFL for tasks assessed/performed Overall Cognitive Status: Within Functional Limits for tasks assessed                                        Exercises      General Comments        Pertinent Vitals/Pain Pain Assessment: No/denies pain    Home Living   Living Arrangements: Other relatives(lives with room mate, John Mcbride)                  Prior Function  PT Goals (current goals can now be found in the care plan section) Acute Rehab PT Goals PT Goal Formulation: With patient Time For Goal Achievement: 11/26/17 Potential to Achieve Goals: Good Progress towards PT goals: Progressing toward goals    Frequency    Min 4X/week      PT Plan Current plan remains appropriate    Co-evaluation              AM-PAC PT "6 Clicks" Daily Activity  Outcome Measure  Difficulty turning over in bed (including adjusting bedclothes, sheets and  blankets)?: A Little Difficulty moving from lying on back to sitting on the side of the bed? : Unable Difficulty sitting down on and standing up from a chair with arms (e.g., wheelchair, bedside commode, etc,.)?: Unable Help needed moving to and from a bed to chair (including a wheelchair)?: A Little Help needed walking in hospital room?: A Lot Help needed climbing 3-5 steps with a railing? : A Lot 6 Click Score: 12    End of Session Equipment Utilized During Treatment: Gait belt Activity Tolerance: Patient tolerated treatment well Patient left: in chair;with call bell/phone within reach;with chair alarm set Nurse Communication: Mobility status PT Visit Diagnosis: Unsteadiness on feet (R26.81);Other abnormalities of gait and mobility (R26.89);Muscle weakness (generalized) (M62.81)     Time: 1326-1400 PT Time Calculation (min) (ACUTE ONLY): 34 min  Charges:  $Gait Training: 23-37 mins                     Erline LevineKellyn Ayanni Tun, PTA Pager: [phone]     Carolynne EdouardKellyn R Nile Prisk 11/13/2017, 3:38 PM

## 2017-11-13 NOTE — H&P (Signed)
Physical Medicine and Rehabilitation Admission H&P    Chief Complaint  Patient presents with  . Dizziness  . Nausea  : HPI: John Mcbride. John Mcbride is a 52 year old right-handed male with history of heart murmur, alcohol use on no prescription medications.  Patient lives with roommates.  Independent prior to admission.  Works full-time Chief Financial Officer.  One level home.  He does have parents in the area that check on him as needed with good support of roommates.  Presented 11/12/2017 with nausea, vomiting as well as dizziness and incoordination.  Cranial CT scan showed suggestion of asymmetrical low-attenuation and possible mass-effect in the left anterior cerebellum and cerebellar peduncle.  MRI showed a 3.5 cm diameter acute infarction in the superior cerebellum on the left.  No evidence of mass-effect or hemorrhage.  CT angiogram of head and neck with no large vessel occlusion.  Patient did not receive TPA.  Echocardiogram with ejection fraction of 65% no wall motion abnormalities.  Neurology consulted maintained on aspirin and Plavix for CVA prophylaxis x3 weeks then aspirin alone.  Subcutaneous.  Lovenox for DVT prophylaxis.  Venous Doppler studies lower extremities negative.  TEE to be completed today.  Tolerating a regular diet.  Physical and occupational therapy evaluations completed with recommendations of physical medicine rehab consult.  Patient was admitted for a comprehensive rehab program.  Review of Systems  Constitutional: Negative for chills and fever.  HENT: Negative for hearing loss.   Eyes: Negative for blurred vision and double vision.  Respiratory: Negative for cough and shortness of breath.   Cardiovascular: Negative for chest pain.  Gastrointestinal: Positive for constipation, nausea and vomiting.  Genitourinary:       Intermittent bouts of bowel and bladder incontinence  Musculoskeletal: Positive for myalgias and neck pain.  Neurological: Positive for dizziness and tingling.   All other systems reviewed and are negative.  Past Medical History:  Diagnosis Date  . Heart murmur    History reviewed. No pertinent surgical history. Family History  Problem Relation Age of Onset  . Cancer Mother   . Cancer Father   . Heart disease Father    Social History:  reports that he has never smoked. He has never used smokeless tobacco. He reports that he drinks about 10.0 standard drinks of alcohol per week. He reports that he does not use drugs. Allergies: No Known Allergies No medications prior to admission.    Drug Regimen Review Drug regimen was reviewed and remains appropriate with no significant issues identified  Home: Home Living Family/patient expects to be discharged to:: Private residence Living Arrangements: Other (Comment) Available Help at Discharge: Family, Friend(s), Available PRN/intermittently Type of Home: House Home Access: Stairs to enter Technical brewer of Steps: 3 Entrance Stairs-Rails: Left Home Layout: One level Bathroom Shower/Tub: Chiropodist: Standard Home Equipment: None Additional Comments: lives with best friend "John Mcbride" - mom/ dad live down the street and are retired. 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 Status:  Mobility: Bed Mobility Overal bed mobility: Needs Assistance Bed Mobility: Supine to Sit Supine to sit: Min guard General bed mobility comments: for safety and upright balance - increased dizziness with movement Transfers Overall transfer level: Needs assistance Equipment used: 2 person hand held assist Transfers: Sit to/from Stand Sit to Stand: Min assist, +2 physical assistance General transfer comment: for safety and stability - patient wanting to reach  forward to sink, with verbal cueing required to push up from bedside Ambulation/Gait Ambulation/Gait  assistance: Min assist, Mod assist, +2 physical assistance Gait Distance (Feet): 20 Feet Assistive device: 2 person hand held assist Gait Pattern/deviations: Step-to pattern, Step-through pattern, Decreased stride length, Staggering left, Staggering right General Gait Details: very unsteady; weight bearing primarily thorugh L UE; increased dizziness with activity; very unsteady - unable to navigate straight path Gait velocity: decreased    ADL: ADL Overall ADL's : Needs assistance/impaired Eating/Feeding: Set up, Sitting Eating/Feeding Details (indicate cue type and reason): pt eating with R hand only with packages pre opened Grooming: Wash/dry hands, Standing, Moderate assistance Grooming Details (indicate cue type and reason): requires total+2 person (A) for balance and progress to leaning on sink for mod (A) Upper Body Bathing: Minimal assistance Lower Body Bathing: Minimal assistance Lower Body Dressing: Moderate assistance Lower Body Dressing Details (indicate cue type and reason): pt able to don socks supine and shoes by pushing feet into shoes. pt able to tie shoes with incr time and very dizzy after leaning over Toilet Transfer: +2 for physical assistance, +2 for safety/equipment, Moderate assistance Functional mobility during ADLs: +2 for physical assistance, +2 for safety/equipment, Minimal assistance General ADL Comments: pt requires (A) for basic transfer +2 (A)  Cognition: Cognition Overall Cognitive Status: Within Functional Limits for tasks assessed Orientation Level: Oriented X4 Cognition Arousal/Alertness: Awake/alert Behavior During Therapy: WFL for tasks assessed/performed Overall Cognitive Status: Within Functional Limits for tasks assessed  Physical Exam: Blood pressure 119/82, pulse 85, temperature 98 F (36.7 C), resp. rate 16, height _0  (1.778 m), weight 104.7 kg, SpO2 96 %. Physical Exam  Vitals reviewed. Constitutional: He is oriented to person,  place, and time. He appears well-developed.  HENT:  Head: Normocephalic.  Eyes: Pupils are equal, round, and reactive to light. EOM are normal.  Neck: Normal range of motion. Neck supple.  Cardiovascular: Normal rate and regular rhythm. Exam reveals no gallop and no friction rub.  No murmur heard. Respiratory: Effort normal. No respiratory distress. He has no wheezes.  GI: Soft. He exhibits no distension. There is no tenderness.  Musculoskeletal: Normal range of motion. He exhibits no edema or deformity.  Neurological: He is alert and oriented to person, place, and time. No cranial nerve deficit.  Normal insight and awareness. Functional memory and language. No nystagmus seen with confrontational testing. Left upper and lower limb ataxia witnessed with HTS and FTN. Mild pronator drift on left. RUE and RLE 5/5. LUE 4+/5, LLE 4+/5. Did not test him in standing. Sensed LT and pain in all 4 limbs.  Skin: Skin is warm. No rash noted. No erythema.  Psychiatric: He has a normal mood and affect. His behavior is normal. Judgment and thought content normal.    Results for orders placed or performed during the hospital encounter of 11/12/17 (from the past 48 hour(s))  Urine rapid drug screen (hosp performed)     Status: Abnormal   Collection Time: 11/12/17  6:58 AM  Result Value Ref Range   Opiates (A) NONE DETECTED    Result not available. Reagent lot number recalled by manufacturer.   Cocaine NONE DETECTED NONE DETECTED   Benzodiazepines NONE DETECTED NONE DETECTED   Amphetamines NONE DETECTED NONE DETECTED   Tetrahydrocannabinol NONE DETECTED NONE DETECTED   Barbiturates NONE DETECTED NONE DETECTED    Comment: (NOTE) DRUG SCREEN FOR MEDICAL PURPOSES ONLY.  IF CONFIRMATION IS NEEDED FOR ANY PURPOSE, NOTIFY LAB WITHIN 5 DAYS. LOWEST DETECTABLE LIMITS  FOR URINE DRUG SCREEN Drug Class                     Cutoff (ng/mL) Amphetamine and metabolites    1000 Barbiturate and metabolites     200 Benzodiazepine                 492 Tricyclics and metabolites     300 Opiates and metabolites        300 Cocaine and metabolites        300 THC                            50 Performed at [facility] [address] [place], [place] [address]   Urinalysis, Routine w reflex microscopic     Status: Abnormal   Collection Time: 11/12/17  6:58 AM  Result Value Ref Range   Color, Urine YELLOW YELLOW   APPearance CLEAR CLEAR   Specific Gravity, Urine 1.038 (H) 1.005 - 1.030   pH 8.0 5.0 - 8.0   Glucose, UA 50 (A) NEGATIVE mg/dL   Hgb urine dipstick NEGATIVE NEGATIVE   Bilirubin Urine NEGATIVE NEGATIVE   Ketones, ur NEGATIVE NEGATIVE mg/dL   Protein, ur 30 (A) NEGATIVE mg/dL   Nitrite NEGATIVE NEGATIVE   Leukocytes, UA NEGATIVE NEGATIVE   RBC / HPF 0-5 0 - 5 RBC/hpf   WBC, UA 0-5 0 - 5 WBC/hpf   Bacteria, UA NONE SEEN NONE SEEN   Mucus PRESENT     Comment: Performed at [facility], [place] [address] [place], [place] [address]  Ethanol     Status: None   Collection Time: 11/12/17  7:28 AM  Result Value Ref Range   Alcohol, Ethyl (B) <10 <10 mg/dL    Comment: (NOTE) Lowest detectable limit for serum alcohol is 10 mg/dL. For medical purposes only. Performed at [facility], [place] [address], [place], [place] [address]   Protime-INR     Status: None   Collection Time: 11/12/17  7:28 AM  Result Value Ref Range   Prothrombin Time 12.7 11.4 - 15.2 seconds   INR 0.96     Comment: Performed at [facility], [place] [address] [place], [place] [address]  APTT     Status: None   Collection Time: 11/12/17  7:28 AM  Result Value Ref Range   aPTT 27 24 - 36 seconds    Comment: Performed at [facility], [place] [address], [place], [place] [address]  CBC     Status: Abnormal   Collection Time: 11/12/17  7:28 AM  Result Value Ref Range   WBC 10.8 (H) 4.0 - 10.5 K/uL   RBC 5.22 4.22 - 5.81 MIL/uL    Hemoglobin 14.9 13.0 - 17.0 g/dL   HCT 42.4 39.0 - 52.0 %   MCV 81.2 78.0 - 100.0 fL   MCH 28.5 26.0 - 34.0 pg   MCHC 35.1 30.0 - 36.0 g/dL   RDW 12.9 11.5 - 15.5 %   Platelets 280 150 - 400 K/uL    Comment: Performed at [facility], [place] [address], [place], [place] [address]  Differential     Status: Abnormal   Collection Time: 11/12/17  7:28 AM  Result Value Ref Range   Neutrophils Relative % 91 %   Neutro Abs 9.7 (H) 1.7 - 7.7 K/uL   Lymphocytes Relative 6 %  Lymphs Abs 0.7 0.7 - 4.0 K/uL   Monocytes Relative 3 %   Monocytes Absolute 0.4 0.1 - 1.0 K/uL   Eosinophils Relative 0 %   Eosinophils Absolute 0.0 0.0 - 0.7 K/uL   Basophils Relative 0 %   Basophils Absolute 0.0 0.0 - 0.1 K/uL    Comment: Performed at [facility], [place] [address], [place], [place] [address]  Comprehensive metabolic panel     Status: Abnormal   Collection Time: 11/12/17  7:28 AM  Result Value Ref Range   Sodium 138 135 - 145 mmol/L   Potassium 3.8 3.5 - 5.1 mmol/L   Chloride 104 98 - 111 mmol/L   CO2 24 22 - 32 mmol/L   Glucose, Bld 146 (H) 70 - 99 mg/dL   BUN 15 6 - 20 mg/dL   Creatinine, Ser 0.96 0.61 - 1.24 mg/dL   Calcium 8.9 8.9 - 10.3 mg/dL   Total Protein 7.2 6.5 - 8.1 g/dL   Albumin 4.2 3.5 - 5.0 g/dL   AST 25 15 - 41 U/L   ALT 26 0 - 44 U/L   Alkaline Phosphatase 43 38 - 126 U/L   Total Bilirubin 1.9 (H) 0.3 - 1.2 mg/dL   GFR calc non Af Amer >60 >60 mL/min   GFR calc Af Amer >60 >60 mL/min    Comment: (NOTE) The eGFR has been calculated using the CKD EPI equation. This calculation has not been validated in all clinical situations. eGFR's persistently <60 mL/min signify possible Chronic Kidney Disease.    Anion gap 10 5 - 15    Comment: Performed at [facility], [place] [address] [place],  [address]  I-stat troponin, ED     Status: None   Collection Time: 11/12/17  7:36 AM  Result Value Ref Range   Troponin i, poc  0.00 0.00 - 0.08 ng/mL   Comment 3            Comment: Due to the release kinetics of cTnI, a negative result within the first hours of the onset of symptoms does not rule out myocardial infarction with certainty. If myocardial infarction is still suspected, repeat the test at appropriate intervals.   I-Stat Chem 8, ED     Status: Abnormal   Collection Time: 11/12/17  7:38 AM  Result Value Ref Range   Sodium 138 135 - 145 mmol/L   Potassium 3.8 3.5 - 5.1 mmol/L   Chloride 103 98 - 111 mmol/L   BUN 14 6 - 20 mg/dL   Creatinine, Ser 0.90 0.61 - 1.24 mg/dL   Glucose, Bld 142 (H) 70 - 99 mg/dL   Calcium, Ion 1.10 (L) 1.15 - 1.40 mmol/L   TCO2 21 (L) 22 - 32 mmol/L   Hemoglobin 14.3 13.0 - 17.0 g/dL   HCT 42.0 39.0 - 52.0 %  HIV antibody (Routine Testing)     Status: None   Collection Time: 11/12/17 11:24 AM  Result Value Ref Range   HIV Screen 4th Generation wRfx Non Reactive Non Reactive    Comment: (NOTE) Performed At: [facility] [place], [place] [id number] Rush Farmer MD [id number]   Hemoglobin A1c     Status: None   Collection Time: 11/13/17  4:15 AM  Result Value Ref Range   Hgb A1c MFr Bld 5.0 4.8 - 5.6 %    Comment: (NOTE) Pre diabetes:          5.7%-6.4% Diabetes:              >  6.4% Glycemic control for   <7.0% adults with diabetes    Mean Plasma Glucose 96.8 mg/dL    Comment: Performed at [facility] [address] [place], [place] [address]  Lipid panel     Status: Abnormal   Collection Time: 11/13/17  4:15 AM  Result Value Ref Range   Cholesterol 139 0 - 200 mg/dL   Triglycerides 101 <150 mg/dL   HDL 34 (L) >40 mg/dL   Total CHOL/HDL Ratio 4.1 RATIO   VLDL 20 0 - 40 mg/dL   LDL Cholesterol 85 0 - 99 mg/dL    Comment:        Total Cholesterol/HDL:CHD Risk Coronary Heart Disease Risk Table                     Men   Women  1/2 Average Risk   3.4   3.3  Average Risk       5.0   4.4  2 X Average Risk   9.6   7.1  3 X  Average Risk  23.4   11.0        Use the calculated Patient Ratio above and the CHD Risk Table to determine the patient's CHD Risk.        ATP III CLASSIFICATION (LDL):  <100     mg/dL   Optimal  100-129  mg/dL   Near or Above                    Optimal  130-159  mg/dL   Borderline  160-189  mg/dL   High  >190     mg/dL   Very High Performed at [facility] [address], [place], [place] [address]   CBC with Differential/Platelet     Status: None   Collection Time: 11/13/17  4:15 AM  Result Value Ref Range   WBC 7.9 4.0 - 10.5 K/uL   RBC 5.25 4.22 - 5.81 MIL/uL   Hemoglobin 14.7 13.0 - 17.0 g/dL   HCT 43.2 39.0 - 52.0 %   MCV 82.3 78.0 - 100.0 fL   MCH 28.0 26.0 - 34.0 pg   MCHC 34.0 30.0 - 36.0 g/dL   RDW 12.8 11.5 - 15.5 %   Platelets 273 150 - 400 K/uL   Neutrophils Relative % 67 %   Neutro Abs 5.3 1.7 - 7.7 K/uL   Lymphocytes Relative 21 %   Lymphs Abs 1.7 0.7 - 4.0 K/uL   Monocytes Relative 10 %   Monocytes Absolute 0.8 0.1 - 1.0 K/uL   Eosinophils Relative 1 %   Eosinophils Absolute 0.1 0.0 - 0.7 K/uL   Basophils Relative 1 %   Basophils Absolute 0.0 0.0 - 0.1 K/uL   Immature Granulocytes 0 %   Abs Immature Granulocytes 0.0 0.0 - 0.1 K/uL    Comment: Performed at [facility], [address], [place], [place] [address]  Comprehensive metabolic panel     Status: Abnormal   Collection Time: 11/13/17  4:15 AM  Result Value Ref Range   Sodium 139 135 - 145 mmol/L   Potassium 3.5 3.5 - 5.1 mmol/L   Chloride 107 98 - 111 mmol/L   CO2 26 22 - 32 mmol/L   Glucose, Bld 106 (H) 70 - 99 mg/dL   BUN 8 6 - 20 mg/dL   Creatinine, Ser 0.97 0.61 - 1.24 mg/dL   Calcium 8.9 8.9 - 10.3 mg/dL   Total  Protein 6.4 (L) 6.5 - 8.1 g/dL   Albumin 3.7 3.5 - 5.0 g/dL   AST 16 15 - 41 U/L   ALT 21 0 - 44 U/L   Alkaline Phosphatase 42 38 - 126 U/L   Total Bilirubin 2.7 (H) 0.3 - 1.2 mg/dL   GFR calc non Af Amer >60 >60 mL/min   GFR calc Af Amer >60 >60 mL/min     Comment: (NOTE) The eGFR has been calculated using the CKD EPI equation. This calculation has not been validated in all clinical situations. eGFR's persistently <60 mL/min signify possible Chronic Kidney Disease.    Anion gap 6 5 - 15    Comment: Performed at [facility] [address] [place], [place] [address]  Magnesium     Status: None   Collection Time: 11/13/17  4:15 AM  Result Value Ref Range   Magnesium 2.3 1.7 - 2.4 mg/dL    Comment: Performed at [facility] [address] [place], [place] [address]  Phosphorus     Status: None   Collection Time: 11/13/17  4:15 AM  Result Value Ref Range   Phosphorus 3.5 2.5 - 4.6 mg/dL    Comment: Performed at [facility] [address], [place], [place] [address]   Ct Angio Head W Or Wo Contrast  Result Date: 11/12/2017 CLINICAL DATA:  Acute left cerebellar infarction. Dizziness, nausea and vomiting. EXAM: CT ANGIOGRAPHY HEAD AND NECK TECHNIQUE: Multidetector CT imaging of the head and neck was performed using the standard protocol during bolus administration of intravenous contrast. Multiplanar CT image reconstructions and MIPs were obtained to evaluate the vascular anatomy. Carotid stenosis measurements (when applicable) are obtained utilizing NASCET criteria, using the distal internal carotid diameter as the denominator. CONTRAST:  57m ISOVUE-370 IOPAMIDOL (ISOVUE-370) INJECTION 76% COMPARISON:  CT and MR study same day. FINDINGS: CTA NECK FINDINGS Aortic arch: Normal Right carotid system: Common carotid artery widely patent to the bifurcation. The carotid bifurcation is normal without soft or calcified plaque. Cervical internal carotid artery is tortuous but widely patent. Left carotid system: Common carotid artery widely patent to the bifurcation. Carotid bifurcation is normal without soft or calcified plaque. Cervical ICA is tortuous but widely patent. Vertebral arteries: Both subclavian arteries appear normal. Both  vertebral artery origins are widely patent. The vertebral arteries are approximately equal in size an widely patent through the cervical region to the foramen magnum. Skeleton: Ordinary cervical spondylosis. Other neck: No mass or lymphadenopathy. Upper chest: Normal Review of the MIP images confirms the above findings CTA HEAD FINDINGS Anterior circulation: Both internal carotid arteries are widely patent through the skull base and siphon regions. The anterior and middle cerebral vessels are patent without proximal stenosis, aneurysm or vascular malformation. Posterior circulation: Both vertebral arteries are widely patent at and through the foramen magnum to the basilar. No basilar stenosis. There are diminutive posterior inferior cerebellar arteries. There are bilateral anterior inferior cerebellar arteries which are patent. Right superior cerebellar artery is normal. Left superior cerebellar artery is occluded just beyond its origin. Both posterior inferior cerebellar arteries are patent and normal. Venous sinuses: Patent and normal. Anatomic variants: None significant. Delayed phase: No abnormal enhancement. Review of the MIP images confirms the above findings IMPRESSION: The patient does not show evidence of atherosclerotic vascular disease in general or of dissection. 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 Electronically Signed   By: MJan FiremanD.  On: 11/12/2017 08:58   Ct Head Wo Contrast  Result Date: 11/12/2017 CLINICAL DATA:  Follow up stroke. EXAM: CT HEAD WITHOUT CONTRAST TECHNIQUE: Contiguous axial images were obtained from the base of the skull through the vertex without intravenous contrast. COMPARISON:  CT HEAD and MRI head November 12, 2017 FINDINGS: BRAIN: LEFT superior cerebellar hypodensity corresponding to known acute infarct. Regional mass effect with slight mass effect on fourth ventricle and cerebral aqueduct. No hydrocephalus.  Borderline parenchymal brain volume loss for age. Patchy supratentorial white matter hypodensities. No intraparenchymal hemorrhage, midline shift. No acute large vascular territory infarcts. Basal cisterns are patent. VASCULAR: Mild calcific atherosclerosis carotid siphon. SKULL/SOFT TISSUES: No skull fracture. No significant soft tissue swelling. ORBITS/SINUSES: The included ocular globes and orbital contents are normal.Trace paranasal sinus mucosal thickening. Mastoid air cells are well aerated. OTHER: None. IMPRESSION: 1. Evolving LEFT cerebellar/SCA territory nonhemorrhagic infarct. Regional mass effect without hydrocephalus. 2. Borderline parenchymal brain volume loss for age. 3. Mild chronic small vessel ischemic changes. Electronically Signed   By: Elon Alas M.D.   On: 11/12/2017 23:06   Ct Head Wo Contrast  Result Date: 11/12/2017 CLINICAL DATA:  Dizziness. Nausea and vomiting. Hydraulic fluid in the ear at work. EXAM: CT HEAD WITHOUT CONTRAST TECHNIQUE: Contiguous axial images were obtained from the base of the skull through the vertex without intravenous contrast. COMPARISON:  None. FINDINGS: Brain: There is suggestion of asymmetrical low-attenuation and possible mass effect in the left anterior cerebellum and cerebellar peduncle. This could represent an area of acute infarct or possibly a mass lesion. MRI is suggested for further evaluation. Intracranial contents are otherwise unremarkable. No ventricular dilatation. No abnormal extra-axial fluid collections. Gray-white matter junctions are distinct. Basal cisterns are not effaced. No acute intracranial hemorrhage. Vascular: No hyperdense vessel or unexpected calcification. Skull: Normal. Negative for fracture or focal lesion. Sinuses/Orbits: Mild mucosal thickening in the paranasal sinuses. No acute air-fluid levels. Mastoid air cells and middle ear cavities are clear. Other: None. IMPRESSION: Suggestion of asymmetrical low-attenuation and  possible mass effect in the left anterior cerebellum and cerebellar peduncle. MRI is recommended for further evaluation. No acute intracranial hemorrhage or mass effect. Electronically Signed   By: Lucienne Capers M.D.   On: 11/12/2017 05:42   Ct Angio Neck W And/or Wo Contrast  Result Date: 11/12/2017 CLINICAL DATA:  Acute left cerebellar infarction. Dizziness, nausea and vomiting. EXAM: CT ANGIOGRAPHY HEAD AND NECK TECHNIQUE: Multidetector CT imaging of the head and neck was performed using the standard protocol during bolus administration of intravenous contrast. Multiplanar CT image reconstructions and MIPs were obtained to evaluate the vascular anatomy. Carotid stenosis measurements (when applicable) are obtained utilizing NASCET criteria, using the distal internal carotid diameter as the denominator. CONTRAST:  71m ISOVUE-370 IOPAMIDOL (ISOVUE-370) INJECTION 76% COMPARISON:  CT and MR study same day. FINDINGS: CTA NECK FINDINGS Aortic arch: Normal Right carotid system: Common carotid artery widely patent to the bifurcation. The carotid bifurcation is normal without soft or calcified plaque. Cervical internal carotid artery is tortuous but widely patent. Left carotid system: Common carotid artery widely patent to the bifurcation. Carotid bifurcation is normal without soft or calcified plaque. Cervical ICA is tortuous but widely patent. Vertebral arteries: Both subclavian arteries appear normal. Both vertebral artery origins are widely patent. The vertebral arteries are approximately equal in size an widely patent through the cervical region to the foramen magnum. Skeleton: Ordinary cervical spondylosis. Other neck: No mass or lymphadenopathy. Upper chest: Normal Review of the MIP images confirms  the above findings CTA HEAD FINDINGS Anterior circulation: Both internal carotid arteries are widely patent through the skull base and siphon regions. The anterior and middle cerebral vessels are patent without  proximal stenosis, aneurysm or vascular malformation. Posterior circulation: Both vertebral arteries are widely patent at and through the foramen magnum to the basilar. No basilar stenosis. There are diminutive posterior inferior cerebellar arteries. There are bilateral anterior inferior cerebellar arteries which are patent. Right superior cerebellar artery is normal. Left superior cerebellar artery is occluded just beyond its origin. Both posterior inferior cerebellar arteries are patent and normal. Venous sinuses: Patent and normal. Anatomic variants: None significant. Delayed phase: No abnormal enhancement. Review of the MIP images confirms the above findings IMPRESSION: The patient does not show evidence of atherosclerotic vascular disease in general or of dissection. 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 Electronically Signed   By: Nelson Chimes M.D.   On: 11/12/2017 08:58   Mr Brain Wo Contrast  Result Date: 11/12/2017 CLINICAL DATA:  Acute presentation with dizziness, nausea and vomiting. Hydraulic fluid in the ear at work. EXAM: MRI HEAD WITHOUT CONTRAST TECHNIQUE: Multiplanar, multiecho pulse sequences of the brain and surrounding structures were obtained without intravenous contrast. COMPARISON:  CT same day FINDINGS: Brain: 3.5 cm in diameter acute infarction affecting the superior cerebellum on the left. No other acute infarction. No significant mass effect. No sign of hemorrhage. No abnormal finding affecting the brainstem. Cerebral hemispheres show scattered foci of T2 and FLAIR signal in the deep white matter consistent with early small vessel ischemic change of a chronic nature. No mass lesion, hydrocephalus or extra-axial collection. Vascular: Major vessels at the base of the brain show flow. Skull and upper cervical spine: Normal Sinuses/Orbits: Minimal mucosal inflammatory changes of the left maxillary sinus. Other: None IMPRESSION: 3.5 cm in  diameter acute infarction in the superior cerebellum on the left. No evidence of significant swelling/mass effect or of any hemorrhage. Mild chronic small-vessel ischemic change of the cerebral hemispheric white matter. No large vessel vascular occlusion identified on this noncontrast study. Electronically Signed   By: Nelson Chimes M.D.   On: 11/12/2017 07:09       Medical Problem List and Plan: 1.  Decreased functional ability with dizziness and ataxia secondary to left cerebellar infarction. Other vestibular symptoms including nausea are improving.  -admit to inpatient rehab 2.  DVT Prophylaxis/Anticoagulation: Subcutaneous Lovenox.  Monitor for any bleeding episodes.  Venous Doppler studies negative 3. Pain Management: Tylenol as needed 4. Mood: Provide emotional support 5. Neuropsych: This patient is capable of making decisions on his own behalf. 6. Skin/Wound Care: Routine skin checks 7. Fluids/Electrolytes/Nutrition: Routine in and outs with follow-up chemistries upon admit 8.  Hyperlipidemia.  Lipitor 9.  Question alcohol use.  Provide counseling      Cathlyn Parsons, PA-C 11/13/2017

## 2017-11-13 NOTE — H&P (View-Only) (Signed)
PROGRESS NOTE    John Mcbride  ZOX:[id number] DOB: 1965/07/20 DOA: 11/12/2017 PCP: Patient, No Pcp Per    Brief Narrative:  52 y.o. male with medical history significant of a heart murmur of unknown type, alcohol use, and other comorbidities who has not seen a physician in years presenting with a chief complaint of dizziness associated with nausea and vomiting.  Patient states that he woke up this morning around midnight and was significantly dizzy and nauseous.  Felt as if he was on a "boat that was rocking".  States that he was unable to ambulate because of his dizziness symptoms.  Patient denied any headaches, blurred vision double vision numbness or tingling in extremities however he did state that he felt weak on his left side.  Patient states the dizziness was significant enough for him to call EMS and while he was in the EMS he had episodes of nausea vomiting which is improved with IV Zofran.  This is never happened to him in the past and states that he has no other real medical issues.  Bloody.  Patient denied any chest pain, shortness breath, burning or discomfort in the urine, and denies any abdominal pain or diarrhea or constipation.  Up and found to have an acute cerebellar CVA and [facility] was called to admit this patient neurology to [facility].  ED Course: Had a stroke alert CT scan of the head along with basic blood work.  Neurology Dr. Laurence SlateAroor was consulted who recommended further work-up and admission to [facility].  Metoclopramide along with meclizine for his dizziness.  Assessment & Plan:   Active Problems:   Cerebellar stroke (HCC)   Nausea and vomiting   Dizziness and giddiness   Hyperglycemia   Leukocytosis   Alcohol use  Acute Left Superior Cerebellum CVA -Head CT w/o Contrast showed symmetrical low-attenuation and possible mass-effect in left anterior cerebellum and cerebellar peduncle but no acute intracranial hemorrhage or mass-effect was identified on CT  scan -MRI of the Brain w/o Contrast showed 0.5 cm acute infarction in the superior cerebellum on the left.  There is no evident significant swelling or mass-effect of any hemorrhage and there is mild chronic small vessel ischemic change of the cerebral hemispheric white matter and no large vessel vascular occlusion identified on this noncontrast study. -CTA of the Head and Neck reviewed with findings of occlusion of the left superior cerebellar artery just beyond its origin.  -TTE unremarkable with normal LVEF -Neurology following. Recommendation for follow up TEE -Therapy recommendations for CIR, consulted -Discussed case with Neurology -Recommendations for ASA 81mg  and Plavix daily x 3 weeks, then ASA alone  Nausea/Vomiting/Dizziness 2/2 to Above -Patient has been continued with IV fluid normal saline at 50 mL's per hour -Continue antiemetics with Ondansetron 4 mg IV every 6 as needed for nausea vomiting -Patient was given meclizine 25 mg p.o. once in the ED along with Metoclopramide 10 mg IV -Stable this AM  Hyperglycemia -Blood sugar on admission was 142 -Likely reactive in the setting of CVA -a1c noted to be 5.0  Leukocytosis -Mild in the setting of CVA -WBC was 10.8 on admission -Suspect reactive in setting of CVA -CBC this am reviewed, normalized  Hyperbilirubinemia -Unclear etiology at this time -Patient had a T bili of 1.9 on admission -Tbili up to 2.7 -Hx of ETOH abuse noted -Will request liver US. Pt tolerating diet  History of Alcohol Abuse -Patient states he drinks 3-5 times a week -Continue CIWA as needed  Obesity -Estimated body mass index is 32.28 kg/m as calculated from the following:   Height as of this encounter: 5\' 10"  (1.778 m).   Weight as of this encounter: 102.1 kg.  -Pt has been counseled on wt loss   DVT prophylaxis: SCD's Code Status: Full Family Communication: Pt in room, family not at bedside Disposition Plan: Uncertain at this  time, CIR possible  Consultants:   Neurology  Procedures:     Antimicrobials: Anti-infectives (From admission, onward)   None       Subjective: Still noting unsteadiness.  Objective: Vitals:   11/12/17 2329 11/13/17 0357 11/13/17 0734 11/13/17 1128  BP: 123/83 125/79 119/82 (!) 127/91  Pulse: 84 (!) 56 85 79  Resp: 20 20 16 16   Temp: 98.4 F (36.9 C) 98.1 F (36.7 C) 98 F (36.7 C) 98.3 F (36.8 C)  TempSrc: Oral Oral  Oral  SpO2: 97% 99% 96% 98%  Weight:  104.7 kg    Height:        Intake/Output Summary (Last 24 hours) at 11/13/2017 1529 Last data filed at 11/13/2017 0231 Gross per 24 hour  Intake 480 ml  Output 1100 ml  Net -620 ml   Filed Weights   11/12/17 0243 11/13/17 0357  Weight: 102.1 kg 104.7 kg    Examination:  General exam: Appears calm and comfortable  Respiratory system: Clear to auscultation. Respiratory effort normal. Cardiovascular system: S1 & S2 heard, RRR Gastrointestinal system: Abdomen is nondistended, soft and nontender. No organomegaly or masses felt. Normal bowel sounds heard. Central nervous system: Alert and oriented. dizziness. Extremities: Symmetric 5 x 5 power. Skin: No rashes, lesions  Psychiatry: Judgement and insight appear normal. Mood & affect appropriate.   Data Reviewed: I have personally reviewed following labs and imaging studies  CBC: Recent Labs  Lab 11/12/17 0728 11/12/17 0738 11/13/17 0415  WBC 10.8*  --  7.9  NEUTROABS 9.7*  --  5.3  HGB 14.9 14.3 14.7  HCT 42.4 42.0 43.2  MCV 81.2  --  82.3  PLT 280  --  273   Basic Metabolic Panel: Recent Labs  Lab 11/12/17 0728 11/12/17 0738 11/13/17 0415  NA 138 138 139  K 3.8 3.8 3.5  CL 104 103 107  CO2 24  --  26  GLUCOSE 146* 142* 106*  BUN 15 14 8   CREATININE 0.96 0.90 0.97  CALCIUM 8.9  --  8.9  MG  --   --  2.3  PHOS  --   --  3.5   GFR: Estimated Creatinine Clearance: 108 mL/min (by C-G formula based on SCr of 0.97 mg/dL). Liver Function  Tests: Recent Labs  Lab 11/12/17 0728 11/13/17 0415  AST 25 16  ALT 26 21  ALKPHOS 43 42  BILITOT 1.9* 2.7*  PROT 7.2 6.4*  ALBUMIN 4.2 3.7   No results for input(s): LIPASE, AMYLASE in the last 168 hours. No results for input(s): AMMONIA in the last 168 hours. Coagulation Profile: Recent Labs  Lab 11/12/17 0728  INR 0.96   Cardiac Enzymes: No results for input(s): CKTOTAL, CKMB, CKMBINDEX, TROPONINI in the last 168 hours. BNP (last 3 results) No results for input(s): PROBNP in the last 8760 hours. HbA1C: Recent Labs    11/13/17 0415  HGBA1C 5.0   CBG: No results for input(s): GLUCAP in the last 168 hours. Lipid Profile: Recent Labs    11/13/17 0415  CHOL 139  HDL 34*  LDLCALC 85  TRIG 161  CHOLHDL 4.1  Thyroid Function Tests: No results for input(s): TSH, T4TOTAL, FREET4, T3FREE, THYROIDAB in the last 72 hours. Anemia Panel: No results for input(s): VITAMINB12, FOLATE, FERRITIN, TIBC, IRON, RETICCTPCT in the last 72 hours. Sepsis Labs: No results for input(s): PROCALCITON, LATICACIDVEN in the last 168 hours.  No results found for this or any previous visit (from the past 240 hour(s)).   Radiology Studies: Ct Angio Head W Or Wo Contrast  Result Date: 11/12/2017 CLINICAL DATA:  Acute left cerebellar infarction. Dizziness, nausea and vomiting. EXAM: CT ANGIOGRAPHY HEAD AND NECK TECHNIQUE: Multidetector CT imaging of the head and neck was performed using the standard protocol during bolus administration of intravenous contrast. Multiplanar CT image reconstructions and MIPs were obtained to evaluate the vascular anatomy. Carotid stenosis measurements (when applicable) are obtained utilizing NASCET criteria, using the distal internal carotid diameter as the denominator. CONTRAST:  80mL ISOVUE-370 IOPAMIDOL (ISOVUE-370) INJECTION 76% COMPARISON:  CT and MR study same day. FINDINGS: CTA NECK FINDINGS Aortic arch: Normal Right carotid system: Common carotid artery  widely patent to the bifurcation. The carotid bifurcation is normal without soft or calcified plaque. Cervical internal carotid artery is tortuous but widely patent. Left carotid system: Common carotid artery widely patent to the bifurcation. Carotid bifurcation is normal without soft or calcified plaque. Cervical ICA is tortuous but widely patent. Vertebral arteries: Both subclavian arteries appear normal. Both vertebral artery origins are widely patent. The vertebral arteries are approximately equal in size an widely patent through the cervical region to the foramen magnum. Skeleton: Ordinary cervical spondylosis. Other neck: No mass or lymphadenopathy. Upper chest: Normal Review of the MIP images confirms the above findings CTA HEAD FINDINGS Anterior circulation: Both internal carotid arteries are widely patent through the skull base and siphon regions. The anterior and middle cerebral vessels are patent without proximal stenosis, aneurysm or vascular malformation. Posterior circulation: Both vertebral arteries are widely patent at and through the foramen magnum to the basilar. No basilar stenosis. There are diminutive posterior inferior cerebellar arteries. There are bilateral anterior inferior cerebellar arteries which are patent. Right superior cerebellar artery is normal. Left superior cerebellar artery is occluded just beyond its origin. Both posterior inferior cerebellar arteries are patent and normal. Venous sinuses: Patent and normal. Anatomic variants: None significant. Delayed phase: No abnormal enhancement. Review of the MIP images confirms the above findings IMPRESSION: The patient does not show evidence of atherosclerotic vascular disease in general or of dissection. 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 Electronically Signed   By: Paulina FusiMark  Shogry M.D.   On: 11/12/2017 08:58   Ct Head Wo Contrast  Result Date: 11/12/2017 CLINICAL DATA:  Follow up  stroke. EXAM: CT HEAD WITHOUT CONTRAST TECHNIQUE: Contiguous axial images were obtained from the base of the skull through the vertex without intravenous contrast. COMPARISON:  CT HEAD and MRI head November 12, 2017 FINDINGS: BRAIN: LEFT superior cerebellar hypodensity corresponding to known acute infarct. Regional mass effect with slight mass effect on fourth ventricle and cerebral aqueduct. No hydrocephalus. Borderline parenchymal brain volume loss for age. Patchy supratentorial white matter hypodensities. No intraparenchymal hemorrhage, midline shift. No acute large vascular territory infarcts. Basal cisterns are patent. VASCULAR: Mild calcific atherosclerosis carotid siphon. SKULL/SOFT TISSUES: No skull fracture. No significant soft tissue swelling. ORBITS/SINUSES: The included ocular globes and orbital contents are normal.Trace paranasal sinus mucosal thickening. Mastoid air cells are well aerated. OTHER: None. IMPRESSION: 1. Evolving LEFT cerebellar/SCA territory nonhemorrhagic infarct. Regional mass effect without hydrocephalus.  2. Borderline parenchymal brain volume loss for age. 3. Mild chronic small vessel ischemic changes. Electronically Signed   By: Awilda Metro M.D.   On: 11/12/2017 23:06   Ct Head Wo Contrast  Result Date: 11/12/2017 CLINICAL DATA:  Dizziness. Nausea and vomiting. Hydraulic fluid in the ear at work. EXAM: CT HEAD WITHOUT CONTRAST TECHNIQUE: Contiguous axial images were obtained from the base of the skull through the vertex without intravenous contrast. COMPARISON:  None. FINDINGS: Brain: There is suggestion of asymmetrical low-attenuation and possible mass effect in the left anterior cerebellum and cerebellar peduncle. This could represent an area of acute infarct or possibly a mass lesion. MRI is suggested for further evaluation. Intracranial contents are otherwise unremarkable. No ventricular dilatation. No abnormal extra-axial fluid collections. Gray-white matter junctions  are distinct. Basal cisterns are not effaced. No acute intracranial hemorrhage. Vascular: No hyperdense vessel or unexpected calcification. Skull: Normal. Negative for fracture or focal lesion. Sinuses/Orbits: Mild mucosal thickening in the paranasal sinuses. No acute air-fluid levels. Mastoid air cells and middle ear cavities are clear. Other: None. IMPRESSION: Suggestion of asymmetrical low-attenuation and possible mass effect in the left anterior cerebellum and cerebellar peduncle. MRI is recommended for further evaluation. No acute intracranial hemorrhage or mass effect. Electronically Signed   By: Burman Nieves M.D.   On: 11/12/2017 05:42   Ct Angio Neck W And/or Wo Contrast  Result Date: 11/12/2017 CLINICAL DATA:  Acute left cerebellar infarction. Dizziness, nausea and vomiting. EXAM: CT ANGIOGRAPHY HEAD AND NECK TECHNIQUE: Multidetector CT imaging of the head and neck was performed using the standard protocol during bolus administration of intravenous contrast. Multiplanar CT image reconstructions and MIPs were obtained to evaluate the vascular anatomy. Carotid stenosis measurements (when applicable) are obtained utilizing NASCET criteria, using the distal internal carotid diameter as the denominator. CONTRAST:  80mL ISOVUE-370 IOPAMIDOL (ISOVUE-370) INJECTION 76% COMPARISON:  CT and MR study same day. FINDINGS: CTA NECK FINDINGS Aortic arch: Normal Right carotid system: Common carotid artery widely patent to the bifurcation. The carotid bifurcation is normal without soft or calcified plaque. Cervical internal carotid artery is tortuous but widely patent. Left carotid system: Common carotid artery widely patent to the bifurcation. Carotid bifurcation is normal without soft or calcified plaque. Cervical ICA is tortuous but widely patent. Vertebral arteries: Both subclavian arteries appear normal. Both vertebral artery origins are widely patent. The vertebral arteries are approximately equal in size an  widely patent through the cervical region to the foramen magnum. Skeleton: Ordinary cervical spondylosis. Other neck: No mass or lymphadenopathy. Upper chest: Normal Review of the MIP images confirms the above findings CTA HEAD FINDINGS Anterior circulation: Both internal carotid arteries are widely patent through the skull base and siphon regions. The anterior and middle cerebral vessels are patent without proximal stenosis, aneurysm or vascular malformation. Posterior circulation: Both vertebral arteries are widely patent at and through the foramen magnum to the basilar. No basilar stenosis. There are diminutive posterior inferior cerebellar arteries. There are bilateral anterior inferior cerebellar arteries which are patent. Right superior cerebellar artery is normal. Left superior cerebellar artery is occluded just beyond its origin. Both posterior inferior cerebellar arteries are patent and normal. Venous sinuses: Patent and normal. Anatomic variants: None significant. Delayed phase: No abnormal enhancement. Review of the MIP images confirms the above findings IMPRESSION: The patient does not show evidence of atherosclerotic vascular disease in general or of dissection. 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 Electronically Signed  By: Paulina Fusi M.D.   On: 11/12/2017 08:58   Mr Brain Wo Contrast  Result Date: 11/12/2017 CLINICAL DATA:  Acute presentation with dizziness, nausea and vomiting. Hydraulic fluid in the ear at work. EXAM: MRI HEAD WITHOUT CONTRAST TECHNIQUE: Multiplanar, multiecho pulse sequences of the brain and surrounding structures were obtained without intravenous contrast. COMPARISON:  CT same day FINDINGS: Brain: 3.5 cm in diameter acute infarction affecting the superior cerebellum on the left. No other acute infarction. No significant mass effect. No sign of hemorrhage. No abnormal finding affecting the brainstem. Cerebral hemispheres  show scattered foci of T2 and FLAIR signal in the deep white matter consistent with early small vessel ischemic change of a chronic nature. No mass lesion, hydrocephalus or extra-axial collection. Vascular: Major vessels at the base of the brain show flow. Skull and upper cervical spine: Normal Sinuses/Orbits: Minimal mucosal inflammatory changes of the left maxillary sinus. Other: None IMPRESSION: 3.5 cm in diameter acute infarction in the superior cerebellum on the left. No evidence of significant swelling/mass effect or of any hemorrhage. Mild chronic small-vessel ischemic change of the cerebral hemispheric white matter. No large vessel vascular occlusion identified on this noncontrast study. Electronically Signed   By: Paulina Fusi M.D.   On: 11/12/2017 07:09    Scheduled Meds: . [START ON 11/14/2017] aspirin EC  81 mg Oral Daily  . atorvastatin  20 mg Oral q1800  . clopidogrel  75 mg Oral Daily  . enoxaparin (LOVENOX) injection  40 mg Subcutaneous Q24H   Continuous Infusions:   LOS: 1 day   Rickey Barbara, MD [facility] Pager [phone]  If 7PM-7AM, please contact night-coverage [email or web address] Password [facility] 11/13/2017, 3:29 PM

## 2017-11-13 NOTE — Progress Notes (Signed)
VASCULAR LAB PRELIMINARY  PRELIMINARY  PRELIMINARY  PRELIMINARY  Bilateral lower extremity venous duplex completed.    Preliminary report:  There is no DVT or SVT noted in the bilateral lower extremities.   Khristina Janota, RVT 11/13/2017, 10:41 AM

## 2017-11-13 NOTE — Progress Notes (Addendum)
STROKE TEAM PROGRESS NOTE   INTERVAL HISTORY No family is at the bedside.  Pt lying in bed. Back from testing. States nausea and vomiting are improved, able to eat lunch. Still clumsy on his L. States he walked to BR with walker and did ok. PT recommending CIR.   Reports hx of murmur as a child. Source unknown. No further workup or cardiology involvement in his lifetime.  Vitals:   11/12/17 1934 11/12/17 2329 11/13/17 0357 11/13/17 0734  BP: 138/84 123/83 125/79 119/82  Pulse: 68 84 (!) 56 85  Resp: 18 20 20 16   Temp: 97.7 F (36.5 C) 98.4 F (36.9 C) 98.1 F (36.7 C) 98 F (36.7 C)  TempSrc: Oral Oral Oral   SpO2: 99% 97% 99% 96%  Weight:   104.7 kg   Height:        CBC:  Recent Labs  Lab 11/12/17 0728 11/12/17 0738 11/13/17 0415  WBC 10.8*  --  7.9  NEUTROABS 9.7*  --  5.3  HGB 14.9 14.3 14.7  HCT 42.4 42.0 43.2  MCV 81.2  --  82.3  PLT 280  --  273    Basic Metabolic Panel:  Recent Labs  Lab 11/12/17 0728 11/12/17 0738 11/13/17 0415  NA 138 138 139  K 3.8 3.8 3.5  CL 104 103 107  CO2 24  --  26  GLUCOSE 146* 142* 106*  BUN 15 14 8   CREATININE 0.96 0.90 0.97  CALCIUM 8.9  --  8.9  MG  --   --  2.3  PHOS  --   --  3.5   Lipid Panel:     Component Value Date/Time   CHOL 139 11/13/2017 0415   TRIG 101 11/13/2017 0415   HDL 34 (L) 11/13/2017 0415   CHOLHDL 4.1 11/13/2017 0415   VLDL 20 11/13/2017 0415   LDLCALC 85 11/13/2017 0415   HgbA1c:  Lab Results  Component Value Date   HGBA1C 5.0 11/13/2017   Urine Drug Screen:     Component Value Date/Time   LABOPIA (A) 11/12/2017 0658    Result not available. Reagent lot number recalled by manufacturer.   COCAINSCRNUR NONE DETECTED 11/12/2017 0658   LABBENZ NONE DETECTED 11/12/2017 0658   AMPHETMU NONE DETECTED 11/12/2017 0658   THCU NONE DETECTED 11/12/2017 0658   LABBARB NONE DETECTED 11/12/2017 0658    Alcohol Level     Component Value Date/Time   ETH <10 11/12/2017 0728    IMAGING  Ct  Head Wo Contrast 11/12/2017 0542 Suggestion of asymmetrical low-attenuation and possible mass effect in the left anterior cerebellum and cerebellar peduncle. MRI is recommended for further evaluation. No acute intracranial hemorrhage or mass effect.   Ct Angio Head W Or Wo Contrast Ct Angio Neck W And/or Wo Contrast 11/12/2017 The patient does not show evidence of atherosclerotic vascular disease in general or of dissection. 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   Mr Brain Wo Contrast 11/12/2017 3.5 cm in diameter acute infarction in the superior cerebellum on the left. No evidence of significant swelling/mass effect or of any hemorrhage. Mild chronic small-vessel ischemic change of the cerebral hemispheric white matter. No large vessel vascular occlusion identified on this noncontrast study.   Ct Head Wo Contrast 11/12/2017 2306 1. Evolving LEFT cerebellar/SCA territory nonhemorrhagic infarct. Regional mass effect without hydrocephalus. 2. Borderline parenchymal brain volume loss for age. 3. Mild chronic small vessel ischemic changes.   2D Echocardiogram  -  Left ventricle: The cavity size was normal. Systolic function was normal. The estimated ejection fraction was in the range of 60% to 65%. Wall motion was normal; there were no regional wall motion abnormalities. Left ventricular diastolic function parameters were normal. - Mitral valve: There was trivial regurgitation. Valve area by pressure half-time: 1.43 cm^2. - Tricuspid valve: There was trivial regurgitation.  LE doppler There is no DVT or SVT noted in the bilateral lower extremities   PHYSICAL EXAM GENERAL: Awake, alert in NAD Lungs: Clear Ext: warm, well perfused, intact peripheral pulses Lungs: CTA HR: regular  NEURO:  Mental Status: AA&Ox3, speech mildly dysarthric.  Naming, repetition, fluency, and comprehension intact. Cranial Nerves: PERRL 2 mm/brisk. EOMI, visual  fields full, no nystagmus, no facial asymmetry, facial sensation intact, hearing intact, tongue/uvula/soft palate midline, Motor: 5/5 throughout. L arm orbits his right. Tone: is normal and bulk is normal Sensation- Intact to light touch bilaterally Coordination: Finger-to-nose was dysmetric on the left, heel-to-shin was dysmetric on the left Gait- deferred   ASSESSMENT/PLAN [name] is a 52 y.o. male with history of ETOH use, heart murmur, obesity presenting with dizziness, inability to walk and nausea & vomiting.  Stroke:  small left SCA infarct felt to be embolic secondary to unknown source  CT head possible abnoramlity L cerebellum/cerebellar peduncle  CTA head & neck occlusion L SCA beyond origin  MRI  L superior cerebellar infarct. Small vessel disease.   Repeat CT head evolving L cerebellar/SCA infarct. Borderline atrophy. Small vessel disease.   2D Echo  EF 60-65%. No source of embolus   LE doppler negative   TEE to look for embolic source. Arranged with [facility] for tomorrow.  (I have made patient NPO after midnight tonight).  Consider loop placement to look for AF.  LDL 85  HgbA1c 5.0  Lovenox 40 mg sq daily for VTE prophylaxis  No antithrombotic prior to admission, now on aspirin 325 mg daily. Given mild stroke, will place on aspirin 81 mg and plavix 75 mg daily x 3 weeks, then aspirin alone. Orders adjusted.   Therapy recommendations:  CIR. Consult in place  Disposition:  pending   Hyperlipidemia  Home meds:  No statin  Now on lipitor 20  LDL 85, goal < 70  Continue statin at discharge  Other Stroke Risk Factors  ETOH use, advised to drink no more than 2 drink(s) a day. Drinks 3-5 days/week  Obesity, Body mass index is 33.12 kg/m., recommend weight loss, diet and exercise as appropriate   NO Hx stroke/TIA prior to this admission  Other Active Problems  Leukocytosis 10.8  Hyperbilirubinemia  1.9  Hospital day # 1  Annie Main, MSN, APRN, ANVP-BC, AGPCNP-BC Advanced Practice Stroke Nurse [facility] See Amion for Schedule & Pager information 11/13/2017 11:34 AM   ATTENDING NOTE: I reviewed above note and agree with the assessment and plan. I have made any additions or clarifications directly to the above note. Pt was seen and examined.   52 year old male with history of alcohol use and not seeing doctor for years presented with dizziness, difficulty walking, and nausea vomiting.  CT showed left cerebellar infarcts.  MRI confirmed left SCA territory infarct.  CT head and neck showed left ICA occlusion.  Repeat CT stable, no hydrocephalus.  EF 60 to 65%.  LDL 85 and A1c 5.0 UDS negative.  LE venous Doppler negative for DVT.  Patient examination only showed left upper extremity finger-to-nose mild dysmetria.  No other focal neurological deficit.  Patient stroke concerning for embolic pattern.  Given young age and the lack of significant stroke risk factors, will do hypercoagulable work-up as well as TEE and loop recorder.  Will place on aspirin 81 and Plavix 75 for 3 weeks and then aspirin alone.  On low-dose statin.  Will follow  Marvel PlanJindong Mykai Wendorf, MD PhD Stroke Neurology 11/13/2017 5:30 PM     To contact Stroke Continuity provider, please refer to [email or web address]. After hours, contact General Neurology

## 2017-11-14 ENCOUNTER — Inpatient Hospital Stay (HOSPITAL_COMMUNITY): Payer: BLUE CROSS/BLUE SHIELD | Admitting: Certified Registered Nurse Anesthetist

## 2017-11-14 ENCOUNTER — Inpatient Hospital Stay (HOSPITAL_COMMUNITY): Payer: BLUE CROSS/BLUE SHIELD

## 2017-11-14 ENCOUNTER — Encounter (HOSPITAL_COMMUNITY): Payer: Self-pay | Admitting: Nurse Practitioner

## 2017-11-14 ENCOUNTER — Encounter (HOSPITAL_COMMUNITY): Payer: Self-pay | Admitting: *Deleted

## 2017-11-14 ENCOUNTER — Encounter (HOSPITAL_COMMUNITY)
Admission: EM | Disposition: A | Discharge status: Rehabilitation Facility | Payer: Self-pay | Source: Home / Self Care | Attending: Internal Medicine

## 2017-11-14 ENCOUNTER — Other Ambulatory Visit: Payer: Self-pay

## 2017-11-14 ENCOUNTER — Inpatient Hospital Stay (HOSPITAL_COMMUNITY)
Admission: RE | Admit: 2017-11-14 | Payer: BLUE CROSS/BLUE SHIELD | Source: Intra-hospital | Admitting: Physical Medicine & Rehabilitation

## 2017-11-14 ENCOUNTER — Inpatient Hospital Stay (HOSPITAL_COMMUNITY)
Admission: RE | Admit: 2017-11-14 | Discharge: 2017-11-21 | DRG: 057 | Disposition: A | Discharge status: Home / Self Care | Payer: BLUE CROSS/BLUE SHIELD | Source: Intra-hospital | Attending: Physical Medicine & Rehabilitation | Admitting: Physical Medicine & Rehabilitation

## 2017-11-14 DIAGNOSIS — Z809 Family history of malignant neoplasm, unspecified: Secondary | ICD-10-CM

## 2017-11-14 DIAGNOSIS — I69393 Ataxia following cerebral infarction: Secondary | ICD-10-CM | POA: Diagnosis not present

## 2017-11-14 DIAGNOSIS — I639 Cerebral infarction, unspecified: Secondary | ICD-10-CM

## 2017-11-14 DIAGNOSIS — Z8249 Family history of ischemic heart disease and other diseases of the circulatory system: Secondary | ICD-10-CM | POA: Diagnosis not present

## 2017-11-14 DIAGNOSIS — I10 Essential (primary) hypertension: Secondary | ICD-10-CM | POA: Diagnosis not present

## 2017-11-14 DIAGNOSIS — Q211 Atrial septal defect: Secondary | ICD-10-CM

## 2017-11-14 DIAGNOSIS — E785 Hyperlipidemia, unspecified: Secondary | ICD-10-CM | POA: Diagnosis not present

## 2017-11-14 DIAGNOSIS — R739 Hyperglycemia, unspecified: Secondary | ICD-10-CM

## 2017-11-14 DIAGNOSIS — R112 Nausea with vomiting, unspecified: Secondary | ICD-10-CM

## 2017-11-14 DIAGNOSIS — G119 Hereditary ataxia, unspecified: Secondary | ICD-10-CM

## 2017-11-14 DIAGNOSIS — R42 Dizziness and giddiness: Secondary | ICD-10-CM

## 2017-11-14 DIAGNOSIS — K760 Fatty (change of) liver, not elsewhere classified: Secondary | ICD-10-CM | POA: Diagnosis not present

## 2017-11-14 DIAGNOSIS — K5900 Constipation, unspecified: Secondary | ICD-10-CM | POA: Diagnosis not present

## 2017-11-14 HISTORY — PX: TEE WITHOUT CARDIOVERSION: SHX5443

## 2017-11-14 LAB — CBC
HCT: 47.5 % (ref 39.0–52.0)
HEMOGLOBIN: 15.9 g/dL (ref 13.0–17.0)
MCH: 27.7 pg (ref 26.0–34.0)
MCHC: 33.5 g/dL (ref 30.0–36.0)
MCV: 82.8 fL (ref 78.0–100.0)
Platelets: 271 10*3/uL (ref 150–400)
RBC: 5.74 MIL/uL (ref 4.22–5.81)
RDW: 12.9 % (ref 11.5–15.5)
WBC: 6.8 10*3/uL (ref 4.0–10.5)

## 2017-11-14 LAB — LUPUS ANTICOAGULANT PANEL
DRVVT: 29.6 s (ref 0.0–47.0)
PTT Lupus Anticoagulant: 33 s (ref 0.0–51.9)

## 2017-11-14 LAB — HOMOCYSTEINE: Homocysteine: 10.7 umol/L (ref 0.0–15.0)

## 2017-11-14 LAB — CREATININE, SERUM
CREATININE: 1.14 mg/dL (ref 0.61–1.24)
GFR calc Af Amer: >60 mL/min (ref >60)
GFR calc non Af Amer: >60 mL/min (ref >60)

## 2017-11-14 LAB — BETA-2-GLYCOPROTEIN I ABS, IGG/M/A
Beta-2 Glyco I IgG: <9 GPI IgG units (ref 0–20)
Beta-2-Glycoprotein I IgA: <9 GPI IgA units (ref 0–25)
Beta-2-Glycoprotein I IgM: <9 GPI IgM units (ref 0–32)

## 2017-11-14 SURGERY — LOOP RECORDER INSERTION

## 2017-11-14 SURGERY — ECHOCARDIOGRAM, TRANSESOPHAGEAL
Anesthesia: Monitor Anesthesia Care

## 2017-11-14 MED ORDER — ATORVASTATIN CALCIUM 20 MG PO TABS
20.0000 mg | ORAL_TABLET | Freq: Every day | ORAL | Status: DC
  Administered 2017-11-14 – 2017-11-20 (×7): 20 mg via ORAL
  Filled 2017-11-14 (×7): qty 1

## 2017-11-14 MED ORDER — PROPOFOL 500 MG/50ML IV EMUL
INTRAVENOUS | Status: DC | PRN
  Administered 2017-11-14: 100 ug/kg/min via INTRAVENOUS

## 2017-11-14 MED ORDER — ACETAMINOPHEN 650 MG RE SUPP: 650.0000 mg | RECTAL | Status: DC | PRN

## 2017-11-14 MED ORDER — SODIUM CHLORIDE 0.9 % IV SOLN: INTRAVENOUS | Status: DC

## 2017-11-14 MED ORDER — CLOPIDOGREL BISULFATE 75 MG PO TABS
75.0000 mg | ORAL_TABLET | Freq: Every day | ORAL | Status: DC
  Administered 2017-11-15 – 2017-11-21 (×7): 75 mg via ORAL
  Filled 2017-11-14 (×7): qty 1

## 2017-11-14 MED ORDER — ACETAMINOPHEN 325 MG PO TABS
650.0000 mg | ORAL_TABLET | ORAL | Status: DC | PRN
  Filled 2017-11-14: qty 2

## 2017-11-14 MED ORDER — SODIUM CHLORIDE 0.9 % IV SOLN
INTRAVENOUS | Status: DC | PRN
  Administered 2017-11-14: 12:00:00 via INTRAVENOUS

## 2017-11-14 MED ORDER — ATORVASTATIN CALCIUM 20 MG PO TABS: 20.0000 mg | ORAL_TABLET | Freq: Every day | ORAL | 0 refills | Status: DC

## 2017-11-14 MED ORDER — ENOXAPARIN SODIUM 40 MG/0.4ML ~~LOC~~ SOLN: 40.0000 mg | SUBCUTANEOUS | Status: DC

## 2017-11-14 MED ORDER — BUTAMBEN-TETRACAINE-BENZOCAINE 2-2-14 % EX AERO
INHALATION_SPRAY | CUTANEOUS | Status: DC | PRN
  Administered 2017-11-14: 1 via TOPICAL

## 2017-11-14 MED ORDER — CLOPIDOGREL BISULFATE 75 MG PO TABS: 75.0000 mg | ORAL_TABLET | Freq: Every day | ORAL | 0 refills | Status: DC

## 2017-11-14 MED ORDER — PROPOFOL 10 MG/ML IV BOLUS
INTRAVENOUS | Status: DC | PRN
  Administered 2017-11-14 (×2): 10 mg via INTRAVENOUS

## 2017-11-14 MED ORDER — SENNOSIDES-DOCUSATE SODIUM 8.6-50 MG PO TABS
1.0000 | ORAL_TABLET | Freq: Every evening | ORAL | Status: DC | PRN
Start: 2017-11-14 — End: 2017-11-21

## 2017-11-14 MED ORDER — ENOXAPARIN SODIUM 40 MG/0.4ML ~~LOC~~ SOLN
40.0000 mg | SUBCUTANEOUS | Status: DC
  Administered 2017-11-15 – 2017-11-21 (×7): 40 mg via SUBCUTANEOUS
  Filled 2017-11-14 (×7): qty 0.4

## 2017-11-14 MED ORDER — ASPIRIN EC 81 MG PO TBEC
81.0000 mg | DELAYED_RELEASE_TABLET | Freq: Every day | ORAL | Status: DC
  Administered 2017-11-15 – 2017-11-21 (×7): 81 mg via ORAL
  Filled 2017-11-14 (×7): qty 1

## 2017-11-14 MED ORDER — ACETAMINOPHEN 160 MG/5ML PO SOLN: 650.0000 mg | ORAL | Status: DC | PRN

## 2017-11-14 MED ORDER — ASPIRIN 81 MG PO TBEC: 81.0000 mg | DELAYED_RELEASE_TABLET | Freq: Every day | ORAL | 0 refills | Status: AC

## 2017-11-14 NOTE — CV Procedure (Signed)
TEE  Pt sedated by anesthesia with propofol intravenously Throat anesthetized with cetacaine spray TEE probe advanced to mid esophagus without difficulty  LA, LAA without masses Moderate PFO noted by color doppler and with injection of agitated saline with bubbles seen in L sided chambers  TV normal MV normal   Trace MR AV normal   No AI PV normal  LVEF and RVEF normal  Thoracic aorta normal.    Ful report to follow

## 2017-11-14 NOTE — Progress Notes (Signed)
Physical Medicine and Rehabilitation Consult Reason for Consult: Decreased functional mobility with dizziness Referring Physician: [facility]   HPI: John Mcbride is a 52 y.o. right-handed male with history of heart murmur, alcohol use on no prescription medications.  Patient lives with roommates.  Independent prior to admission.  Works Arts administratorfull-time-airplane repair.  One level home.  He does have parents in the area that can check on him as needed as well as good support of roommates.  Presented 11/12/2017 with nausea vomiting as well as dizziness and incoordination.  Cranial CT scan showed suggestion of asymmetrical low attenuation and possible mass-effect in the left anterior cerebellum and cerebellar peduncle.  MRI showed 3.5 cm diameter acute infarction in the superior cerebellum on the left.  No evidence of mass-effect or hemorrhage.  CT angiogram of head and neck with no large vessel occlusion.  Patient did not receive TPA.  Echocardiogram with ejection fraction of 65% no wall motion abnormalities.  Neurology follow-up currently on aspirin for CVA prophylaxis.  Subcutaneous Lovenox for DVT prophylaxis.  Await plan for possible TEE.  Therapy evaluations completed with recommendations of physical medicine rehab consult.   Review of Systems  Constitutional: Negative for chills and fever.  HENT: Negative for hearing loss.   Eyes: Negative for blurred vision and double vision.  Respiratory: Negative for cough and shortness of breath.   Cardiovascular: Negative for chest pain, palpitations and leg swelling.  Gastrointestinal: Positive for constipation, nausea and vomiting.  Genitourinary: Negative for dysuria, flank pain and hematuria.  Skin: Negative for rash.  Neurological: Positive for dizziness. Negative for seizures.  All other systems reviewed and are negative.      Past Medical History:  Diagnosis Date  . Heart murmur    History reviewed. No pertinent surgical history.      Family  History  Problem Relation Age of Onset  . Cancer Mother   . Cancer Father   . Heart disease Father    Social History:  reports that he has never smoked. He has never used smokeless tobacco. He reports that he drinks about 10.0 standard drinks of alcohol per week. He reports that he does not use drugs. Allergies: No Known Allergies No medications prior to admission.    Home: Home Living Family/patient expects to be discharged to:: Private residence Living Arrangements: Other (Comment) Available Help at Discharge: Family, Friend(s), Available PRN/intermittently Type of Home: House Home Access: Stairs to enter Secretary/administratorntrance Stairs-Number of Steps: 3 Entrance Stairs-Rails: Left Home Layout: One level Bathroom Shower/Tub: Engineer, manufacturing systemsTub/shower unit Bathroom Toilet: Standard Home Equipment: None Additional Comments: lives with best friend "John RuizJohn" - mom/ dad live down the street and are retired. 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 Functional Status:  Mobility: Bed Mobility Overal bed mobility: Needs Assistance Bed Mobility: Supine to Sit Supine to sit: Min guard General bed mobility comments: for safety and upright balance - increased dizziness with movement Transfers Overall transfer level: Needs assistance Equipment used: 2 person hand held assist Transfers: Sit to/from Stand Sit to Stand: Min assist, +2 physical assistance General transfer comment: for safety and stability - patient wanting to reach forward to sink, with verbal cueing required to push up from bedside Ambulation/Gait Ambulation/Gait assistance: Min assist, Mod assist, +2 physical assistance Gait Distance (Feet): 20 Feet Assistive device: 2 person hand held assist Gait Pattern/deviations: Step-to pattern, Step-through pattern, Decreased stride length, Staggering left, Staggering right General  Gait Details:  very unsteady; weight bearing primarily thorugh L UE; increased dizziness with activity; very unsteady - unable to navigate straight path Gait velocity: decreased  ADL: ADL Overall ADL's : Needs assistance/impaired Eating/Feeding: Set up, Sitting Eating/Feeding Details (indicate cue type and reason): pt eating with R hand only with packages pre opened Grooming: Wash/dry hands, Standing, Moderate assistance Grooming Details (indicate cue type and reason): requires total+2 person (A) for balance and progress to leaning on sink for mod (A) Upper Body Bathing: Minimal assistance Lower Body Bathing: Minimal assistance Lower Body Dressing: Moderate assistance Lower Body Dressing Details (indicate cue type and reason): pt able to don socks supine and shoes by pushing feet into shoes. pt able to tie shoes with incr time and very dizzy after leaning over Toilet Transfer: +2 for physical assistance, +2 for safety/equipment, Moderate assistance Functional mobility during ADLs: +2 for physical assistance, +2 for safety/equipment, Minimal assistance General ADL Comments: pt requires (A) for basic transfer +2 (A)  Cognition: Cognition Overall Cognitive Status: Within Functional Limits for tasks assessed Cognition Arousal/Alertness: Awake/alert Behavior During Therapy: WFL for tasks assessed/performed Overall Cognitive Status: Within Functional Limits for tasks assessed  Blood pressure 118/81, pulse 68, temperature 98.1 F (36.7 C), temperature source Oral, resp. rate 16, height 5\' 10"  (1.778 m), weight 102.1 kg, SpO2 99 %. Physical Exam  Vitals reviewed. Constitutional: He is oriented to person, place, and time. He appears well-developed.  HENT:  Head: Normocephalic.  Eyes: EOM are normal. Right eye exhibits no discharge. Left eye exhibits no discharge.  Neck: Normal range of motion. Neck supple. No thyromegaly present.  Cardiovascular: Normal rate, regular rhythm and normal heart sounds.    Respiratory: Effort normal and breath sounds normal. No respiratory distress.  GI: Soft. Bowel sounds are normal. He exhibits no distension.  Neurological: He is alert and oriented to person, place, and time.  Mood is a bit flat but appropriate.  Follows full commands. Ataxia LUE and LLE.   Skin: Skin is warm and dry.  Assessment/Plan: Diagnosis: left cerebellar infarct 1. Does the need for close, 24 hr/day medical supervision in concert with the patient's rehab needs make it unreasonable for this patient to be served in a less intensive setting? Yes 2. Co-Morbidities requiring supervision/potential complications: htn 3. Due to bladder management, bowel management, safety, skin/wound care, disease management, medication administration, pain management and patient education, does the patient require 24 hr/day rehab nursing? Yes 4. Does the patient require coordinated care of a physician, rehab nurse, PT (1-2 hrs/day, 5 days/week) and OT (1-2 hrs/day, 5 days/week) to address physical and functional deficits in the context of the above medical diagnosis(es)? Yes Addressing deficits in the following areas: balance, endurance, locomotion, strength, transferring, bowel/bladder control, bathing, dressing, feeding, grooming, toileting and psychosocial support 5. Can the patient actively participate in an intensive therapy program of at least 3 hrs of therapy per day at least 5 days per week? Yes 6. The potential for patient to make measurable gains while on inpatient rehab is good 7. Anticipated functional outcomes upon discharge from inpatient rehab are modified independent  with PT, modified independent with OT, n/a with SLP. 8. Estimated rehab length of stay to reach the above functional goals is: 11-15 days 9. Anticipated D/C setting: Home 10. Anticipated post D/C treatments: HH therapy 11. Overall Rehab/Functional Prognosis: excellent  RECOMMENDATIONS: This patient's condition is appropriate for  continued rehabilitative care in the following setting: CIR Patient has agreed to participate in recommended program. Yes Note  that insurance prior authorization may be required for reimbursement for recommended care.  Comment: Rehab Admissions Coordinator to follow up.  Thanks,  Ranelle Oyster, MD, [place] [facility]  I have personally performed a face to face diagnostic evaluation of this patient. Additionally, I have reviewed and concur with the physician assistant's documentation above.    Mcarthur Rossetti Angiulli, PA-C 11/12/2017        Revision History                        Routing History

## 2017-11-14 NOTE — Progress Notes (Signed)
Standley Brooking, RN  Rehab Admission Coordinator  Physical Medicine and Rehabilitation  PMR Pre-admission  Addendum  Date of Service:  11/13/2017 3:04 PM       Related encounter: ED to Hosp-Admission (Current) from 11/12/2017 in [place] [facility]         Show:Clear all [x] Manual[x] Template[x] Copied  Added by: [x] Standley Brooking, RN  [] Hover for details PMR Admission Coordinator Pre-Admission Assessment  Patient: John Mcbride is an 52 y.o., male MRN: [id number] DOB: Oct 20, 1965 Height: 5\' 10"  (177.8 cm) Weight: 105.2 kg                                                                                                                                                  Insurance Information HMO:     PPO: yes     PCP:      IPA:      80/20:      OTHER:  PRIMARY: [employer] [place]      Policy#: [id number]      Subscriber: pt CM Name: Aletha Halim      Phone#: [phone]     Fax#: [phone] Pre-Cert#: [id number] approved until *      Employer: [employer] Benefits:  Phone #: [phone]     Name: 11/13/17 Eff. Date: 04/01/2017     Deduct: $3000      Out of Pocket Max: $4500      Life Max: none CIR: 80%      SNF: 80% 60 days Outpatient: 80%     Co-Pay: 30 visits combined PT and OT; 30 visits SLP Home Health: 80%      Co-Pay: visits per medical neccesity with authorization DME: 80%     Co-Pay: 20% Providers: in network  SECONDARY: none     Medicaid Application Date:       Case Manager:  Disability Application Date:       Case Worker:   Emergency Contact Information         Contact Information    Name Relation Home Work Mobile   Bells,Phyllis Mother [phone]       Current Medical History  Patient Admitting Diagnosis: left cerebellar infarct  History of Present Illness: John Mcbride is a 52 year old right-handed male with history of heart murmur, alcohol use on no prescription medications. Presented 11/12/2017 with nausea, vomiting as well as  dizziness and incoordination. Cranial CT scan showed suggestion of asymmetrical low-attenuation and possible mass-effect in the left anterior cerebellum and cerebellar peduncle. MRI showed a 3.5 cm diameter acute infarction in the superior cerebellum on the left. No evidence of mass-effect or hemorrhage. CT angiogram of head and neck with no large vessel occlusion. Patient did not receive TPA. Echocardiogram with ejection fraction of 65% no wall motion abnormalities. Neurology consulted maintained on aspirin for CVA prophylaxis. Subcutaneous. Lovenox for DVT prophylaxis.  Venous Doppler studies lower extremities negative. Tolerating a regular diet. TEE planned for today 8/16 with results pending and possible LOOP placement pending TEE results.    Complete NIHSS TOTAL: 0  Past Medical History      Past Medical History:  Diagnosis Date  . Heart murmur     Family History  family history includes Cancer in his father and mother; Heart disease in his father.  Prior Rehab/Hospitalizations:  Has the patient had major surgery during 100 days prior to admission? No  Current Medications   Current Facility-Administered Medications:  .  0.9 %  sodium chloride infusion, , Intravenous, Continuous, Kroeger, Krista M., PA-C .  [MAR Hold] acetaminophen (TYLENOL) tablet 650 mg, 650 mg, Oral, Q4H PRN **OR** [MAR Hold] acetaminophen (TYLENOL) solution 650 mg, 650 mg, Per Tube, Q4H PRN **OR** [MAR Hold] acetaminophen (TYLENOL) suppository 650 mg, 650 mg, Rectal, Q4H PRN, Sheikh, Omair Latif, DO .  [MAR Hold] aspirin EC tablet 81 mg, 81 mg, Oral, Daily, Biby, Sharon L, NP, 81 mg at 11/14/17 0939 .  [MAR Hold] atorvastatin (LIPITOR) tablet 20 mg, 20 mg, Oral, q1800, Marvel Plan, MD, 20 mg at 11/13/17 1923 .  [MAR Hold] clopidogrel (PLAVIX) tablet 75 mg, 75 mg, Oral, Daily, Biby, Sharon L, NP, 75 mg at 11/14/17 0939 .  [MAR Hold] enoxaparin (LOVENOX) injection 40 mg, 40 mg, Subcutaneous, Q24H,  Sheikh, Omair Latif, DO, 40 mg at 11/13/17 1527 .  [MAR Hold] ondansetron (ZOFRAN) injection 4 mg, 4 mg, Intravenous, Q6H PRN, Marguerita Merles Latif, DO, 4 mg at 11/12/17 2129 .  [MAR Hold] senna-docusate (Senokot-S) tablet 1 tablet, 1 tablet, Oral, QHS PRN, Merlene Laughter, DO  Patients Current Diet:     Diet Order             Diet NPO time specified Except for: Sips with Meds  Diet effective midnight               Precautions / Restrictions Precautions Precautions: Fall Restrictions Weight Bearing Restrictions: No   Has the patient had 2 or more falls or a fall with injury in the past year?No  Prior Activity Level Community (5-7x/wk): Independent, driving, working, Engineer, technical sales / Equipment Home Assistive Devices/Equipment: None Home Equipment: None  Prior Device Use: Indicate devices/aids used by the patient prior to current illness, exacerbation or injury? None of the above  Prior Functional Level Prior Function Level of Independence: Independent Comments: works full time - Scientific laboratory technician Care: Did the patient need help bathing, dressing, using the toilet or eating?  Independent  Indoor Mobility: Did the patient need assistance with walking from room to room (with or without device)? Independent  Stairs: Did the patient need assistance with internal or external stairs (with or without device)? Independent  Functional Cognition: Did the patient need help planning regular tasks such as shopping or remembering to take medications? Independent  Current Functional Level Cognition  Overall Cognitive Status: Within Functional Limits for tasks assessed Orientation Level: Oriented X4    Extremity Assessment (includes Sensation/Coordination)  Upper Extremity Assessment: LUE deficits/detail LUE Deficits / Details: decr grasp 4 out 5 , ataxic movement LUE Sensation: decreased proprioception LUE Coordination:  decreased gross motor  Lower Extremity Assessment: Defer to PT evaluation RLE Deficits / Details: grossly 5/5 stength RLE Coordination: WNL LLE Deficits / Details: grossly 5/5 stength LLE Coordination: decreased gross motor    ADLs  Overall ADL's : Needs assistance/impaired Eating/Feeding: Set up, Sitting Eating/Feeding  Details (indicate cue type and reason): pt eating with R hand only with packages pre opened Grooming: Wash/dry hands, Standing, Moderate assistance Grooming Details (indicate cue type and reason): requires total+2 person (A) for balance and progress to leaning on sink for mod (A) Upper Body Bathing: Minimal assistance Lower Body Bathing: Minimal assistance Lower Body Dressing: Moderate assistance Lower Body Dressing Details (indicate cue type and reason): pt able to don socks supine and shoes by pushing feet into shoes. pt able to tie shoes with incr time and very dizzy after leaning over Toilet Transfer: +2 for physical assistance, +2 for safety/equipment, Moderate assistance Functional mobility during ADLs: +2 for physical assistance, +2 for safety/equipment, Minimal assistance General ADL Comments: pt requires (A) for basic transfer +2 (A)    Mobility  Overal bed mobility: Needs Assistance Bed Mobility: Supine to Sit Supine to sit: Min guard General bed mobility comments: use of rail     Transfers  Overall transfer level: Needs assistance Equipment used: Rolling walker (2 wheeled) Transfers: Sit to/from Stand Sit to Stand: Min assist, +2 physical assistance General transfer comment: assistance to power up into standing and for balance    Ambulation / Gait / Stairs / Wheelchair Mobility  Ambulation/Gait Ambulation/Gait assistance: Mod assist, +2 safety/equipment Gait Distance (Feet): 100 Feet Assistive device: Rolling walker (2 wheeled) Gait Pattern/deviations: Step-through pattern, Decreased stride length, Staggering left, Staggering right,  Ataxic General Gait Details: cues for cadence; pt has difficulty controlling gait speed; intially gait training without AD and pt continues to be very unsteady and ataxic and reaching for objects to hold onto despite HHA; RW used for increased stability and pt required mod A for balance and safety with RW; +2 for safety Gait velocity: decreased    Posture / Balance Balance Overall balance assessment: Needs assistance Sitting-balance support: Bilateral upper extremity supported, Feet supported Sitting balance-Leahy Scale: Fair Standing balance support: Bilateral upper extremity supported, During functional activity Standing balance-Leahy Scale: Poor Standing balance comment: pt able to static stand briefly without UE support     Special needs/care consideration BiPAP/CPAP  N/a CPM n/a Continuous Drip IV n/a Dialysis  N/a Life Vest n/a Oxygen n/a Special Bed n/a Trach Size n/a Wound Vac n/a Skin intact Bowel mgmt: continent; LBM  11/10/2017 Bladder mgmt: continent Diabetic mgmt n/a   Previous Home Environment Living Arrangements: Other relatives(lives with room mate, Neldon NewportJohn Angel)  Lives With: Friend(s) Available Help at Discharge: Family, Friend(s), Available PRN/intermittently Type of Home: House Home Layout: One level Home Access: Stairs to enter Entrance Stairs-Rails: Left Entrance Stairs-Number of Steps: 3 Bathroom Shower/Tub: Engineer, manufacturing systemsTub/shower unit Bathroom Toilet: Standard Bathroom Accessibility: Yes How Accessible: Accessible via walker Home Care Services: No Additional Comments: lives with best friend "[name]" - mom/ dad live down the street and are retired. pt works on air planes for a living  Discharge Living Setting Plans for Discharge Living Setting: Patient's home, House Type of Home at Discharge: House Discharge Home Layout: One level Discharge Home Access: Stairs to enter Entrance Stairs-Rails: Left Entrance Stairs-Number of Steps: 3 Discharge Bathroom  Shower/Tub: Tub/shower unit, Curtain Discharge Bathroom Toilet: Standard Discharge Bathroom Accessibility: Yes How Accessible: Accessible via walker Does the patient have any problems obtaining your medications?: No  Social/Family/Support Systems Patient Roles: (employee) Contact Information: Jamesetta SoPhyllis, Mom Anticipated Caregiver: Parents and friend, [name] Anticipated Industrial/product designerCaregiver's Contact Information: see above Ability/Limitations of Caregiver: Parents live dow[address] the street. He may go home to his house or go stay with his parents Caregiver Availability: Intermittent Discharge  Plan Discussed with Primary Caregiver: Yes Is Caregiver In Agreement with Plan?: Yes Does Caregiver/Family have Issues with Lodging/Transportation while Pt is in Rehab?: No  Goals/Additional Needs Patient/Family Goal for Rehab: Mod I with PT and OT Expected length of stay: ELOS 11 to 15 days Pt/Family Agrees to Admission and willing to participate: Yes Program Orientation Provided & Reviewed with Pt/Caregiver Including Roles  & Responsibilities: Yes  Decrease burden of Care through IP rehab admission: n/a  Possible need for SNF placement upon discharge: not anticipated  Patient Condition: This patient's condition remains as documented in the consult dated 11/13/2017, in which the Rehabilitation Physician determined and documented that the patient's condition is appropriate for intensive rehabilitative care in an inpatient rehabilitation facility. Will admit to inpatient rehab today.  Preadmission Screen Completed By:  Clois DupesBoyette, Bich Mchaney Godwin, 11/14/2017 11:58 AM ______________________________________________________________________   Discussed status with Dr. Riley KillSwartz on 11/14/2017 at 11/27/17 and received telephone approval for admission today.  Admission Coordinator:  Clois DupesBoyette, Mickelle Goupil Godwin, time [id number] Date 11/14/2017       Cosigned by: Ranelle OysterSwartz, Zachary T, MD at 11/14/2017 12:02 PM  Revision History

## 2017-11-14 NOTE — Discharge Summary (Signed)
Physician Discharge Summary  John Mcbride UJW:[id number] DOB: [birth year] DOA: 11/12/2017  PCP: Patient, No Pcp Per  Admit date: 11/12/2017 Discharge date: 11/14/2017  Admitted From: Home Disposition:  CIR  Recommendations for Outpatient Follow-up:  1. Follow up with PCP in 1-2 weeks 2. Follow up with Neurology in 4 weeks 3. Follow up with Cardiology as scheduled  Discharge Condition:Stable CODE STATUS:Full Diet recommendation: Heart healthy   Brief/Interim Summary: 52 y.o.malewith medical history significant ofa heart murmur of unknown type, alcohol use, and other comorbidities who has not seen a physician in years presenting with a chief complaint of dizziness associated with nausea and vomiting. Patient states that he woke up this morning around midnight and was significantly dizzy and nauseous. Felt as if he was on a "boat that was rocking". States that he was unable to ambulate because of his dizziness symptoms. Patient denied any headaches, blurred vision double vision numbness or tingling in extremities however he did state that he felt weak on his left side. Patient states the dizziness was significant enough for him to call EMS and while he was in the EMS he had episodes of nausea vomiting which is improved with IV Zofran. This is never happened to him in the past and states that he has no other real medical issues. Bloody. Patient denied any chest pain, shortness breath, burning or discomfort in the urine,and denies any abdominal pain or diarrhea or constipation.Up and found to have an acute cerebellar CVA and [facility] was called to admit this patient neurology to consult at [facility].  ED Course:Had a stroke alert CT scan of the head along with basic blood work. Neurology Dr. Laurence Slate was consulted who recommended further work-up and admission to [facility]. Metoclopramide along with meclizine for his dizziness.  Acute Left Superior Cerebellum CVA -Head CT w/o Contrast  showedsymmetrical low-attenuation and possible mass-effect in left anterior cerebellum and cerebellar peduncle but no acute intracranial hemorrhage or mass-effect was identified on CT scan -MRI of the Brain w/o Contrast showed0.5 cm acute infarction in the superior cerebellum on the left. There is no evident significant swelling or mass-effect of any hemorrhage and there is mild chronic small vessel ischemic change of the cerebral hemispheric white matter and no large vessel vascular occlusion identified on this noncontrast study. -CTA of the Head and Neck reviewed with findings of occlusion of the left superior cerebellar artery just beyond its origin.  -TTE unremarkable with normal LVEF -Neurology following. Recommendation for follow up TEE -TEE largely unremarkable with exception of PFO noted. Neurology discussed with Cardiology with plans for outpatient PFO closure -Therapy recommendations for CIR -Recommendations for ASA 81mg  and Plavix daily x 3 weeks, then ASA alone (ASA and plavix through 9/3 then aspirin alone daily starting 9/4)  Nausea/Vomiting/Dizziness 2/2 to Above -Patient has been continued with IV fluid normal saline at 50 mL's per hour -Continue antiemetics withOndansetron 4 mg IV every 6 as needed for nausea vomiting  Hyperglycemia -Blood sugar on admission was 142 -Likely reactive in the setting of CVA -a1c noted to be 5.0  Leukocytosis -Mild in the setting of CVA -WBC was 10.8 on admission -Suspect reactive in setting of CVA -CBC reviewed, normalized  Hyperbilirubinemia -Unclear etiology at this time -Patient had a T bili of 1.9 on admission -Tbili up to 2.7 -Hx of ETOH abuse noted -Liver US notable for fatty liver, likely secondary to ETOH abuse  History ofAlcoholAbuse -Patient states he drinks 3-5 times a week -No evidence of withdrawals  Obesity -Estimated body mass index is 32.28 kg/m as calculated from the following: Height as of this  encounter: 5\' 10"  (1.778 m). Weight as of this encounter: 102.1 kg. -Pt has been counseled on wt loss  Discharge Diagnoses:  Active Problems:   Cerebellar stroke (HCC)   Nausea and vomiting   Dizziness and giddiness   Hyperglycemia   Leukocytosis   Alcohol use    Discharge Instructions  Discharge Instructions    Ambulatory referral to Neurology   Complete by:  As directed    Follow up with stroke clinic NP (Jessica Vanschaick or Darrol Angelarolyn Martin, if both not available, consider Manson AllanSethi, Penumali, or Ahern) [facility] weeks. Thanks.     Allergies as of 11/14/2017   No Known Allergies     Medication List    STOP taking these medications   azithromycin 250 MG tablet Commonly known as:  ZITHROMAX   HYDROcodone-homatropine 5-1.5 MG/5ML syrup Commonly known as:  HYCODAN   omeprazole 20 MG capsule Commonly known as:  PRILOSEC     TAKE these medications   aspirin 81 MG EC tablet Take 1 tablet (81 mg total) by mouth daily. Start taking on:  11/15/2017   atorvastatin 20 MG tablet Commonly known as:  LIPITOR Take 1 tablet (20 mg total) by mouth daily at 6 PM.   clopidogrel 75 MG tablet Commonly known as:  PLAVIX Take 1 tablet (75 mg total) by mouth daily. Start taking on:  11/15/2017      Follow-up Information    [facility]. Schedule an appointment as soon as possible for a visit in 4 week(s).   Specialty:  Radiology Contact information: [address] [place] [id number] [phone]       [facility] [address] [facility] Follow up on 11/27/2017.   Specialty:  Cardiology Why:  2:00PM, wound check visit Contact information: [address], [address] [place] [id number] [phone]       Tonny Bollmanooper, Michael, MD. Go on 11/24/2017.   Specialty:  Cardiology Why:  @ 3:40pm to discuss possible PFO closure.  Contact information: [address] [place]  [id number] [phone]          No Known Allergies  Consultations:  Neurology  CIR  Procedures/Studies: Ct Angio Head W Or Wo Contrast  Result Date: 11/12/2017 CLINICAL DATA:  Acute left cerebellar infarction. Dizziness, nausea and vomiting. EXAM: CT ANGIOGRAPHY HEAD AND NECK TECHNIQUE: Multidetector CT imaging of the head and neck was performed using the standard protocol during bolus administration of intravenous contrast. Multiplanar CT image reconstructions and MIPs were obtained to evaluate the vascular anatomy. Carotid stenosis measurements (when applicable) are obtained utilizing NASCET criteria, using the distal internal carotid diameter as the denominator. CONTRAST:  80mL ISOVUE-370 IOPAMIDOL (ISOVUE-370) INJECTION 76% COMPARISON:  CT and MR study same day. FINDINGS: CTA NECK FINDINGS Aortic arch: Normal Right carotid system: Common carotid artery widely patent to the bifurcation. The carotid bifurcation is normal without soft or calcified plaque. Cervical internal carotid artery is tortuous but widely patent. Left carotid system: Common carotid artery widely patent to the bifurcation. Carotid bifurcation is normal without soft or calcified plaque. Cervical ICA is tortuous but widely patent. Vertebral arteries: Both subclavian arteries appear normal. Both vertebral artery origins are widely patent. The vertebral arteries are approximately equal in size an widely patent through the cervical region to the foramen magnum. Skeleton: Ordinary cervical spondylosis. Other neck: No  mass or lymphadenopathy. Upper chest: Normal Review of the MIP images confirms the above findings CTA HEAD FINDINGS Anterior circulation: Both internal carotid arteries are widely patent through the skull base and siphon regions. The anterior and middle cerebral vessels are patent without proximal stenosis, aneurysm or vascular malformation. Posterior circulation: Both vertebral arteries are widely patent at and through  the foramen magnum to the basilar. No basilar stenosis. There are diminutive posterior inferior cerebellar arteries. There are bilateral anterior inferior cerebellar arteries which are patent. Right superior cerebellar artery is normal. Left superior cerebellar artery is occluded just beyond its origin. Both posterior inferior cerebellar arteries are patent and normal. Venous sinuses: Patent and normal. Anatomic variants: None significant. Delayed phase: No abnormal enhancement. Review of the MIP images confirms the above findings IMPRESSION: The patient does not show evidence of atherosclerotic vascular disease in general or of dissection. 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 Electronically Signed   By: Paulina Fusi M.D.   On: 11/12/2017 08:58   Ct Head Wo Contrast  Result Date: 11/12/2017 CLINICAL DATA:  Follow up stroke. EXAM: CT HEAD WITHOUT CONTRAST TECHNIQUE: Contiguous axial images were obtained from the base of the skull through the vertex without intravenous contrast. COMPARISON:  CT HEAD and MRI head November 12, 2017 FINDINGS: BRAIN: LEFT superior cerebellar hypodensity corresponding to known acute infarct. Regional mass effect with slight mass effect on fourth ventricle and cerebral aqueduct. No hydrocephalus. Borderline parenchymal brain volume loss for age. Patchy supratentorial white matter hypodensities. No intraparenchymal hemorrhage, midline shift. No acute large vascular territory infarcts. Basal cisterns are patent. VASCULAR: Mild calcific atherosclerosis carotid siphon. SKULL/SOFT TISSUES: No skull fracture. No significant soft tissue swelling. ORBITS/SINUSES: The included ocular globes and orbital contents are normal.Trace paranasal sinus mucosal thickening. Mastoid air cells are well aerated. OTHER: None. IMPRESSION: 1. Evolving LEFT cerebellar/SCA territory nonhemorrhagic infarct. Regional mass effect without hydrocephalus. 2. Borderline  parenchymal brain volume loss for age. 3. Mild chronic small vessel ischemic changes. Electronically Signed   By: Awilda Metro M.D.   On: 11/12/2017 23:06   Ct Head Wo Contrast  Result Date: 11/12/2017 CLINICAL DATA:  Dizziness. Nausea and vomiting. Hydraulic fluid in the ear at work. EXAM: CT HEAD WITHOUT CONTRAST TECHNIQUE: Contiguous axial images were obtained from the base of the skull through the vertex without intravenous contrast. COMPARISON:  None. FINDINGS: Brain: There is suggestion of asymmetrical low-attenuation and possible mass effect in the left anterior cerebellum and cerebellar peduncle. This could represent an area of acute infarct or possibly a mass lesion. MRI is suggested for further evaluation. Intracranial contents are otherwise unremarkable. No ventricular dilatation. No abnormal extra-axial fluid collections. Gray-white matter junctions are distinct. Basal cisterns are not effaced. No acute intracranial hemorrhage. Vascular: No hyperdense vessel or unexpected calcification. Skull: Normal. Negative for fracture or focal lesion. Sinuses/Orbits: Mild mucosal thickening in the paranasal sinuses. No acute air-fluid levels. Mastoid air cells and middle ear cavities are clear. Other: None. IMPRESSION: Suggestion of asymmetrical low-attenuation and possible mass effect in the left anterior cerebellum and cerebellar peduncle. MRI is recommended for further evaluation. No acute intracranial hemorrhage or mass effect. Electronically Signed   By: Burman Nieves M.D.   On: 11/12/2017 05:42   Ct Angio Neck W And/or Wo Contrast  Result Date: 11/12/2017 CLINICAL DATA:  Acute left cerebellar infarction. Dizziness, nausea and vomiting. EXAM: CT ANGIOGRAPHY HEAD AND NECK TECHNIQUE: Multidetector CT imaging of the head and neck was  performed using the standard protocol during bolus administration of intravenous contrast. Multiplanar CT image reconstructions and MIPs were obtained to evaluate the  vascular anatomy. Carotid stenosis measurements (when applicable) are obtained utilizing NASCET criteria, using the distal internal carotid diameter as the denominator. CONTRAST:  80mL ISOVUE-370 IOPAMIDOL (ISOVUE-370) INJECTION 76% COMPARISON:  CT and MR study same day. FINDINGS: CTA NECK FINDINGS Aortic arch: Normal Right carotid system: Common carotid artery widely patent to the bifurcation. The carotid bifurcation is normal without soft or calcified plaque. Cervical internal carotid artery is tortuous but widely patent. Left carotid system: Common carotid artery widely patent to the bifurcation. Carotid bifurcation is normal without soft or calcified plaque. Cervical ICA is tortuous but widely patent. Vertebral arteries: Both subclavian arteries appear normal. Both vertebral artery origins are widely patent. The vertebral arteries are approximately equal in size an widely patent through the cervical region to the foramen magnum. Skeleton: Ordinary cervical spondylosis. Other neck: No mass or lymphadenopathy. Upper chest: Normal Review of the MIP images confirms the above findings CTA HEAD FINDINGS Anterior circulation: Both internal carotid arteries are widely patent through the skull base and siphon regions. The anterior and middle cerebral vessels are patent without proximal stenosis, aneurysm or vascular malformation. Posterior circulation: Both vertebral arteries are widely patent at and through the foramen magnum to the basilar. No basilar stenosis. There are diminutive posterior inferior cerebellar arteries. There are bilateral anterior inferior cerebellar arteries which are patent. Right superior cerebellar artery is normal. Left superior cerebellar artery is occluded just beyond its origin. Both posterior inferior cerebellar arteries are patent and normal. Venous sinuses: Patent and normal. Anatomic variants: None significant. Delayed phase: No abnormal enhancement. Review of the MIP images confirms the  above findings IMPRESSION: The patient does not show evidence of atherosclerotic vascular disease in general or of dissection. 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 Electronically Signed   By: Paulina FusiMark  Shogry M.D.   On: 11/12/2017 08:58   Mr Brain Wo Contrast  Result Date: 11/12/2017 CLINICAL DATA:  Acute presentation with dizziness, nausea and vomiting. Hydraulic fluid in the ear at work. EXAM: MRI HEAD WITHOUT CONTRAST TECHNIQUE: Multiplanar, multiecho pulse sequences of the brain and surrounding structures were obtained without intravenous contrast. COMPARISON:  CT same day FINDINGS: Brain: 3.5 cm in diameter acute infarction affecting the superior cerebellum on the left. No other acute infarction. No significant mass effect. No sign of hemorrhage. No abnormal finding affecting the brainstem. Cerebral hemispheres show scattered foci of T2 and FLAIR signal in the deep white matter consistent with early small vessel ischemic change of a chronic nature. No mass lesion, hydrocephalus or extra-axial collection. Vascular: Major vessels at the base of the brain show flow. Skull and upper cervical spine: Normal Sinuses/Orbits: Minimal mucosal inflammatory changes of the left maxillary sinus. Other: None IMPRESSION: 3.5 cm in diameter acute infarction in the superior cerebellum on the left. No evidence of significant swelling/mass effect or of any hemorrhage. Mild chronic small-vessel ischemic change of the cerebral hemispheric white matter. No large vessel vascular occlusion identified on this noncontrast study. Electronically Signed   By: Paulina FusiMark  Shogry M.D.   On: 11/12/2017 07:09   [place] Abdomen Limited Ruq  Result Date: 11/13/2017 CLINICAL DATA:  Bilirubinemia EXAM: ULTRASOUND ABDOMEN LIMITED RIGHT UPPER QUADRANT COMPARISON:  None. FINDINGS: Gallbladder: No gallstones or wall thickening visualized. No sonographic Murphy sign noted by sonographer. Common bile duct:  Diameter: Normal caliber, 4 mm Liver: Heterogeneous,  increased echotexture compatible with fatty infiltration. No focal abnormality or biliary duct dilatation. Portal vein is patent on color Doppler imaging with normal direction of blood flow towards the liver. IMPRESSION: Fatty infiltration of the liver.  No acute findings. Electronically Signed   By: Charlett Nose M.D.   On: 11/13/2017 20:10     Subjective: Without complaints  Discharge Exam: Vitals:   11/14/17 1252 11/14/17 1300  BP: 106/78 116/79  Pulse: 95 93  Resp: 17 17  Temp: 98.5 F (36.9 C)   SpO2: 94% 98%   Vitals:   11/14/17 0822 11/14/17 1130 11/14/17 1252 11/14/17 1300  BP: 127/81 136/84 106/78 116/79  Pulse: 72 83 95 93  Resp: 17 17 17 17   Temp: 98.3 F (36.8 C) 98.4 F (36.9 C) 98.5 F (36.9 C)   TempSrc:  Oral Oral   SpO2: 95% 97% 94% 98%  Weight:      Height:        General: Pt is alert, awake, not in acute distress Cardiovascular: RRR, S1/S2 +, no rubs, no gallops Respiratory: CTA bilaterally, no wheezing, no rhonchi Abdominal: Soft, NT, ND, bowel sounds + Extremities: no edema, no cyanosis   The results of significant diagnostics from this hospitalization (including imaging, microbiology, ancillary and laboratory) are listed below for reference.     Microbiology: No results found for this or any previous visit (from the past 240 hour(s)).   Labs: BNP (last 3 results) No results for input(s): BNP in the last 8760 hours. Basic Metabolic Panel: Recent Labs  Lab 11/12/17 0728 11/12/17 0738 11/13/17 0415  NA 138 138 139  K 3.8 3.8 3.5  CL 104 103 107  CO2 24  --  26  GLUCOSE 146* 142* 106*  BUN 15 14 8   CREATININE 0.96 0.90 0.97  CALCIUM 8.9  --  8.9  MG  --   --  2.3  PHOS  --   --  3.5   Liver Function Tests: Recent Labs  Lab 11/12/17 0728 11/13/17 0415  AST 25 16  ALT 26 21  ALKPHOS 43 42  BILITOT 1.9* 2.7*  PROT 7.2 6.4*  ALBUMIN 4.2 3.7   No results for input(s): LIPASE,  AMYLASE in the last 168 hours. No results for input(s): AMMONIA in the last 168 hours. CBC: Recent Labs  Lab 11/12/17 0728 11/12/17 0738 11/13/17 0415  WBC 10.8*  --  7.9  NEUTROABS 9.7*  --  5.3  HGB 14.9 14.3 14.7  HCT 42.4 42.0 43.2  MCV 81.2  --  82.3  PLT 280  --  273   Cardiac Enzymes: No results for input(s): CKTOTAL, CKMB, CKMBINDEX, TROPONINI in the last 168 hours. BNP: Invalid input(s): POCBNP CBG: No results for input(s): GLUCAP in the last 168 hours. D-Dimer No results for input(s): DDIMER in the last 72 hours. Hgb A1c Recent Labs    11/13/17 0415  HGBA1C 5.0   Lipid Profile Recent Labs    11/13/17 0415  CHOL 139  HDL 34*  LDLCALC 85  TRIG 409  CHOLHDL 4.1   Thyroid function studies No results for input(s): TSH, T4TOTAL, T3FREE, THYROIDAB in the last 72 hours.  Invalid input(s): FREET3 Anemia work up No results for input(s): VITAMINB12, FOLATE, FERRITIN, TIBC, IRON, RETICCTPCT in the last 72 hours. Urinalysis    Component Value Date/Time   COLORURINE YELLOW 11/12/2017 0658   APPEARANCEUR CLEAR 11/12/2017 0658   LABSPEC 1.038 (H) 11/12/2017 0658   PHURINE 8.0 11/12/2017 8119  GLUCOSEU 50 (A) 11/12/2017 0658   HGBUR NEGATIVE 11/12/2017 0658   BILIRUBINUR NEGATIVE 11/12/2017 0658   KETONESUR NEGATIVE 11/12/2017 0658   PROTEINUR 30 (A) 11/12/2017 0658   NITRITE NEGATIVE 11/12/2017 0658   LEUKOCYTESUR NEGATIVE 11/12/2017 0658   Sepsis Labs Invalid input(s): PROCALCITONIN,  WBC,  LACTICIDVEN Microbiology No results found for this or any previous visit (from the past 240 hour(s)).  Time spent:  SIGNED:   Rickey Barbara, MD  [facility] 11/14/2017, 2:45 PM  If 7PM-7AM, please contact night-coverage [email or web address] Password [email or web address]

## 2017-11-14 NOTE — Anesthesia Procedure Notes (Signed)
Procedure Name: MAC Date/Time: 11/14/2017 12:30 PM Performed by: Harden Mo, CRNA Pre-anesthesia Checklist: Patient identified, Suction available, Emergency Drugs available and Patient being monitored Patient Re-evaluated:Patient Re-evaluated prior to induction Oxygen Delivery Method: Nasal cannula Preoxygenation: Pre-oxygenation with 100% oxygen Induction Type: IV induction Placement Confirmation: positive ETCO2 and breath sounds checked- equal and bilateral Dental Injury: Teeth and Oropharynx as per pre-operative assessment

## 2017-11-14 NOTE — Discharge Instructions (Signed)
Post implant wound care Keep incision clean and dry for 3 days. You can remove outer dressing tomorrow. Leave steri-strips (little pieces of tape) on until seen in the office for wound check appointment. Call the office [phone]) for redness, drainage, swelling, or fever.   Take Aspirin and plavix for 3 weeks then continue 81mg  aspirin daily (Take both aspirin and plavix through 12/02/17, then on 12/03/17, continue aspirin alone daily)

## 2017-11-14 NOTE — H&P (Signed)
Physical Medicine and Rehabilitation Admission H&P     Chief Complaint  John Mcbride presents with  . Dizziness  . Nausea  :  HPI: John Mcbride is a 52 year old right-handed male with history of heart murmur, alcohol use on no prescription medications. John Mcbride lives with roommates. Independent prior to admission. Works full-time Press photographerairplane repair. One level home. John Mcbride does have parents in the area that check on him as needed with good support of roommates. Presented 11/12/2017 with nausea, vomiting as well as dizziness and incoordination. Cranial CT scan showed suggestion of asymmetrical low-attenuation and possible mass-effect in the left anterior cerebellum and cerebellar peduncle. MRI showed a 3.5 cm diameter acute infarction in the superior cerebellum on the left. No evidence of mass-effect or hemorrhage. CT angiogram of head and neck with no large vessel occlusion. John Mcbride did not receive TPA. Echocardiogram with ejection fraction of 65% no wall motion abnormalities. Neurology consulted maintained on aspirin and Plavix for CVA prophylaxis x3 weeks then aspirin alone. Subcutaneous. Lovenox for DVT prophylaxis. Venous Doppler studies lower extremities negative. TEE to be completed today. Tolerating a regular diet. Physical and occupational therapy evaluations completed with recommendations of physical medicine rehab consult. John Mcbride was admitted for a comprehensive rehab program.  Review of Systems  Constitutional: Negative for chills and fever.  HENT: Negative for hearing loss.  Eyes: Negative for blurred vision and double vision.  Respiratory: Negative for cough and shortness of breath.  Cardiovascular: Negative for chest pain.  Gastrointestinal: Positive for constipation, nausea and vomiting.  Genitourinary:  Intermittent bouts of bowel and bladder incontinence  Musculoskeletal: Positive for myalgias and neck pain.  Neurological: Positive for dizziness and tingling.  All other systems reviewed  and are negative.       Past Medical History:  Diagnosis Date  . Heart murmur    History reviewed. No pertinent surgical history.       Family History  Problem Relation Age of Onset  . Cancer Mother   . Cancer Father   . Heart disease Father    Social History: reports that John Mcbride has never smoked. John Mcbride has never used smokeless tobacco. John Mcbride reports that John Mcbride drinks about 10.0 standard drinks of alcohol per week. John Mcbride reports that John Mcbride does not use drugs.  Allergies: No Known Allergies  No medications prior to admission.   Drug Regimen Review  Drug regimen was reviewed and remains appropriate with no significant issues identified  Home:  Home Living  Family/John Mcbride expects to be discharged to:: Private residence  Living Arrangements: Other (Comment)  Available Help at Discharge: Family, Friend(s), Available PRN/intermittently  Type of Home: House  Home Access: Stairs to enter  Secretary/administratorntrance Stairs-Number of Steps: 3  Entrance Stairs-Rails: Left  Home Layout: One level  Bathroom Shower/Tub: Medical sales representativeTub/shower unit  Bathroom Toilet: Standard  Home Equipment: None  Additional Comments: lives with best friend "John RuizJohn" - mom/ dad live down the street and are retired. pt works on air planes for a living  Functional History:  Prior Function  Level of Independence: Independent  Comments: works full time - Programmer, applicationsairplane repair  Functional Status:  Mobility:  Bed Mobility  Overal bed mobility: Needs Assistance  Bed Mobility: Supine to Sit  Supine to sit: Min guard  General bed mobility comments: for safety and upright balance - increased dizziness with movement  Transfers  Overall transfer level: Needs assistance  Equipment used: 2 person hand held assist  Transfers: Sit to/from Stand  Sit to Stand: Min assist, +2 physical assistance  General  transfer comment: for safety and stability - John Mcbride wanting to reach forward to sink, with verbal cueing required to push up from bedside  Ambulation/Gait    Ambulation/Gait assistance: Min assist, Mod assist, +2 physical assistance  Gait Distance (Feet): 20 Feet  Assistive device: 2 person hand held assist  Gait Pattern/deviations: Step-to pattern, Step-through pattern, Decreased stride length, Staggering left, Staggering right  General Gait Details: very unsteady; weight bearing primarily thorugh L UE; increased dizziness with activity; very unsteady - unable to navigate straight path  Gait velocity: decreased   ADL:  ADL  Overall ADL's : Needs assistance/impaired  Eating/Feeding: Set up, Sitting  Eating/Feeding Details (indicate cue type and reason): pt eating with R hand only with packages pre opened  Grooming: Wash/dry hands, Standing, Moderate assistance  Grooming Details (indicate cue type and reason): requires total+2 person (A) for balance and progress to leaning on sink for mod (A)  Upper Body Bathing: Minimal assistance  Lower Body Bathing: Minimal assistance  Lower Body Dressing: Moderate assistance  Lower Body Dressing Details (indicate cue type and reason): pt able to don socks supine and shoes by pushing feet into shoes. pt able to tie shoes with incr time and very dizzy after leaning over  Toilet Transfer: +2 for physical assistance, +2 for safety/equipment, Moderate assistance  Functional mobility during ADLs: +2 for physical assistance, +2 for safety/equipment, Minimal assistance  General ADL Comments: pt requires (A) for basic transfer +2 (A)  Cognition:  Cognition  Overall Cognitive Status: Within Functional Limits for tasks assessed  Orientation Level: Oriented X4  Cognition  Arousal/Alertness: Awake/alert  Behavior During Therapy: WFL for tasks assessed/performed  Overall Cognitive Status: Within Functional Limits for tasks assessed  Physical Exam:  Blood pressure 119/82, pulse 85, temperature 98 F (36.7 C), resp. rate 16, height 5\' 10"  (1.778 m), weight 104.7 kg, SpO2 96 %.  Physical Exam  Vitals reviewed.   Constitutional: John Mcbride is oriented to person, place, and time. John Mcbride appears well-developed.  HENT:  Head: Normocephalic.  Eyes: Pupils are equal, round, and reactive to light. EOM are normal.  Neck: Normal range of motion. Neck supple.  Cardiovascular: Normal rate and regular rhythm. Exam reveals no gallop and no friction rub.  No murmur heard.  Respiratory: Effort normal. No respiratory distress. John Mcbride has no wheezes.  GI: Soft. John Mcbride exhibits no distension. There is no tenderness.  Musculoskeletal: Normal range of motion. John Mcbride exhibits no edema or deformity.  Neurological: John Mcbride is alert and oriented to person, place, and time. No cranial nerve deficit.  Normal insight and awareness. Functional memory and language. No nystagmus seen with confrontational testing. Left upper and lower limb ataxia witnessed with HTS and FTN. Mild pronator drift on left. RUE and RLE 5/5. LUE 4+/5, LLE 4+/5. Did not test him in standing. Sensed LT and pain in all 4 limbs.  Skin: Skin is warm. No rash noted. No erythema.  Psychiatric: John Mcbride has a normal mood and affect. John Mcbride behavior is normal. Judgment and thought content normal.   Lab Results Last 48 Hours  Imaging Results (Last 48 hours)     Medical Problem List and Plan:  1. Decreased functional ability with dizziness and ataxia secondary to left cerebellar infarction. Other vestibular symptoms including nausea are improving.  -admit to inpatient rehab  2. DVT Prophylaxis/Anticoagulation: Subcutaneous Lovenox. Monitor for any bleeding episodes. Venous Doppler studies negative  3. Pain Management: Tylenol as needed  4. Mood: Provide emotional support  5. Neuropsych: This John Mcbride is capable of making decisions on John Mcbride own behalf.  6. Skin/Wound Care: Routine skin checks  7. Fluids/Electrolytes/Nutrition: Routine in and outs with follow-up chemistries upon admit  8. Hyperlipidemia. Lipitor  9. Question alcohol use. Provide counseling    Post Admission Physician Evaluation: 1. Functional deficits secondary  to left cerebellar infarct. 2. John Mcbride is admitted to receive collaborative, interdisciplinary care between the physiatrist, rehab nursing staff, and therapy team. 3. John Mcbride's level of medical complexity and substantial therapy needs in context of that medical necessity cannot be provided at a lesser intensity of care such as a SNF. 4. John Mcbride has experienced substantial functional loss from John Mcbride/her baseline which was documented above under the "Functional History" and "Functional Status" headings.  Judging by the John Mcbride's diagnosis, physical exam, and functional history, the John Mcbride has potential for functional progress which will result  in measurable gains while on inpatient rehab.  These gains will be of substantial and practical use upon discharge  in facilitating mobility and self-care at the household level. 5. Physiatrist will provide 24 hour management of medical needs as well as oversight of the therapy plan/treatment and provide guidance as appropriate regarding the interaction of the two. 6. The Preadmission Screening has been reviewed and John Mcbride status is unchanged unless otherwise stated above. 7. 24 hour rehab nursing will assist with bladder management, bowel management, safety, skin/wound care, disease management, medication administration, pain management and John Mcbride education  and help integrate therapy concepts, techniques,education, etc. 8. PT will assess and treat for/with: Lower extremity strength, range of motion, stamina, balance, functional mobility, safety, adaptive techniques and equipment, NMR, vestibular rx.   Goals are: mod I. OT will assess and treat for/with: ADL's, functional mobility, safety, upper extremity strength, adaptive techniques and equipment, NMR, vesitbular rx, ego support   Goals are: mod I. Therapy may proceed with showering this John Mcbride. 9. SLP will assess and treat for/with: n/a.  Goals are: n/a. 10. Case Management and Social Worker will assess and treat for psychological issues and discharge planning. 11. Team conference will be held weekly to assess progress toward goals and to determine barriers to discharge. 12. John Mcbride will receive at least 3 hours of therapy per day at least 5 days per week. 13. ELOS: 10-15 days       14. Prognosis:  excellent   I have personally performed a face to face diagnostic evaluation of this John Mcbride and formulated the key components of the plan.  Additionally, I have personally reviewed laboratory data, imaging studies, as well as relevant notes and concur with the physician assistant's documentation above.  Ranelle OysterZachary T. Soo Steelman, MD, FAAPMR     Mcarthur Rossettianiel  J Angiulli, PA-C  11/13/2017

## 2017-11-14 NOTE — Progress Notes (Signed)
Inpatient Rehabilitation Admissions Coordinator  I have insurance approval and bed to admit pt to CIR today. I have notified Dr. Chiu, SW, Marisue IvanLiz and RN CM, Lelon MastSaRhona Leavensmantha. I will make arrangements to admit later today once medical workup is complete. TEE and LOOP pending. Pt is aware and in agreement to plan.  Ottie GlazierBarbara Lilliona Blakeney, RN, MSN Rehab Admissions Coordinator [phone] 11/14/2017 10:44 AM

## 2017-11-14 NOTE — Progress Notes (Addendum)
STROKE TEAM PROGRESS NOTE   INTERVAL HISTORY Up with rehab team using RW. Doing well today with no c/o. He has some difficulty with fine motor and spacing as well as observed gait imbalance at this time.    Vitals:   11/13/17 2305 11/14/17 0312 11/14/17 0822 11/14/17 1130  BP: 122/84 118/86 127/81 136/84  Pulse: 83 81 72 83  Resp: 20 18 17 17   Temp: 98.6 F (37 C) 98.6 F (37 C) 98.3 F (36.8 C) 98.4 F (36.9 C)  TempSrc: Oral Oral  Oral  SpO2: 96% 98% 95% 97%  Weight:  105.2 kg    Height:        CBC:  Recent Labs  Lab 11/12/17 0728 11/12/17 0738 11/13/17 0415  WBC 10.8*  --  7.9  NEUTROABS 9.7*  --  5.3  HGB 14.9 14.3 14.7  HCT 42.4 42.0 43.2  MCV 81.2  --  82.3  PLT 280  --  273    Basic Metabolic Panel:  Recent Labs  Lab 11/12/17 0728 11/12/17 0738 11/13/17 0415  NA 138 138 139  K 3.8 3.8 3.5  CL 104 103 107  CO2 24  --  26  GLUCOSE 146* 142* 106*  BUN 15 14 8   CREATININE 0.96 0.90 0.97  CALCIUM 8.9  --  8.9  MG  --   --  2.3  PHOS  --   --  3.5   Lipid Panel:     Component Value Date/Time   CHOL 139 11/13/2017 0415   TRIG 101 11/13/2017 0415   HDL 34 (L) 11/13/2017 0415   CHOLHDL 4.1 11/13/2017 0415   VLDL 20 11/13/2017 0415   LDLCALC 85 11/13/2017 0415   HgbA1c:  Lab Results  Component Value Date   HGBA1C 5.0 11/13/2017   Urine Drug Screen:     Component Value Date/Time   LABOPIA (A) 11/12/2017 0658    Result not available. Reagent lot number recalled by manufacturer.   COCAINSCRNUR NONE DETECTED 11/12/2017 0658   LABBENZ NONE DETECTED 11/12/2017 0658   AMPHETMU NONE DETECTED 11/12/2017 0658   THCU NONE DETECTED 11/12/2017 0658   LABBARB NONE DETECTED 11/12/2017 0658    Alcohol Level     Component Value Date/Time   ETH <10 11/12/2017 0728    IMAGING reviewed in EMR:  Ct Head Wo Contrast 11/12/2017 0542 Suggestion of asymmetrical low-attenuation and possible mass effect in the left anterior cerebellum and cerebellar peduncle.  MRI is recommended for further evaluation. No acute intracranial hemorrhage or mass effect.   Ct Angio Head W Or Wo Contrast Ct Angio Neck W And/or Wo Contrast 11/12/2017 The patient does not show evidence of atherosclerotic vascular disease in general or of dissection. 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   Mr Brain Wo Contrast 11/12/2017 3.5 cm in diameter acute infarction in the superior cerebellum on the left. No evidence of significant swelling/mass effect or of any hemorrhage. Mild chronic small-vessel ischemic change of the cerebral hemispheric white matter. No large vessel vascular occlusion identified on this noncontrast study.   Ct Head Wo Contrast 11/12/2017 2306 1. Evolving LEFT cerebellar/SCA territory nonhemorrhagic infarct. Regional mass effect without hydrocephalus. 2. Borderline parenchymal brain volume loss for age. 3. Mild chronic small vessel ischemic changes.   2D Echocardiogram  - Left ventricle: The cavity size was normal. Systolic function was normal. The estimated ejection fraction was in the range of 60% to 65%. Wall motion was normal;  there were no regional wall motion abnormalities. Left ventricular diastolic function parameters were normal. - Mitral valve: There was trivial regurgitation. Valve area by pressure half-time: 1.43 cm^2. - Tricuspid valve: There was trivial regurgitation.  LE doppler There is no DVT or SVT noted in the bilateral lower extremities   PHYSICAL EXAM GENERAL: Awake, alert in NAD Lungs: Clear, normal respirations Ext: warm, well perfused, intact peripheral pulses HR: regular, mild systolic murmur  NEURO:  Mental Status: AA&Ox3, speech mildly dysarthric.  Naming, repetition, fluency, and comprehension intact. Cranial Nerves: PERRL 2 mm/brisk. EOMI, visual fields full, no nystagmus, no facial asymmetry, facial sensation intact, hearing intact, tongue/uvula/soft palate midline, Motor:  5/5 throughout. L arm orbits his right. Tone: is normal and bulk is normal Sensation- Intact to light touch bilaterally Coordination: Finger-to-nose was dysmetric on the left, heel-to-shin was dysmetric on the left Gait- unbalanced, distance distuberance   ASSESSMENT/PLAN Mr. Elvina SidleDavid G Harms is a 52 y.o. male with history of ETOH use, heart murmur, obesity presenting with dizziness, inability to walk and nausea & vomiting.  Stroke:  small left SCA infarct felt to be embolic secondary to unknown source  CT head possible abnoramlity L cerebellum/cerebellar peduncle  CTA head & neck occlusion L SCA beyond origin  MRI  L superior cerebellar infarct. Small vessel disease.   Repeat CT head evolving L cerebellar/SCA infarct. Borderline atrophy. Small vessel disease.   2D Echo  EF 60-65%. No source of embolus   LE doppler negative   TEE to look for embolic source. Arranged with [facility] for tomorrow.  (I have made patient NPO after midnight tonight).  Consider loop placement to look for AF.  LDL 85  HgbA1c 5.0  Lovenox 40 mg sq daily for VTE prophylaxis  No antithrombotic prior to admission, now on aspirin 325 mg daily. Given mild stroke, will place on aspirin 81 mg and plavix 75 mg daily x 3 weeks, then aspirin alone. Orders adjusted.   Therapy recommendations:  CIR. Consult in place  Disposition:  pending TEE, if neg then LINQ will be needed  Hyperlipidemia  Home meds:  No statin  Now on lipitor 20  LDL 85, goal < 70  Continue statin at discharge  Other Stroke Risk Factors  ETOH use, advised to drink no more than 2 drink(s) a day. Drinks 3-5 days/week  Obesity, Body mass index is 33.28 kg/m., recommend weight loss, diet and exercise as appropriate   NO Hx stroke/TIA prior to this admission  Other Active Problems  Leukocytosis 10.8  Hyperbilirubinemia 1.9  Hospital day # 3  Desiree Metzger-Cihelka, MSN, ARNP-C Advanced Practice  Stroke Nurse [facility] See Amion for Schedule & Pager information 11/14/2017 12:12 PM   ATTENDING NOTE: I reviewed above note and agree with the assessment and plan. I have made any additions or clarifications directly to the above note. Pt was seen and examined.   52 year old male with history of alcohol use and not seeing doctor for years presented with dizziness, difficulty walking, and nausea vomiting.  CT showed left cerebellar infarcts.  MRI confirmed left SCA territory infarct.  CT head and neck showed left SCA occlusion.  Repeat CT stable, no hydrocephalus.  EF 60 to 65%.  LDL 85 and A1c 5.0, UDS negative.  LE venous Doppler negative for DVT. Had TEE today showed moderate PFO. hypercoagulable work-up so far negative but still pending for some tests.  Patient examination only showed mild left upper extremity finger-to-nose mild dysmetria.  No other focal neurological deficit.  Patient stroke concerning for embolic pattern.  Given young age and the lack of significant stroke risk factors and positive for moderate PFO with high ROPE score, his stroke could be related to PFO. Discussed with Dr. Graciela Husbands, agree that loop probably not needed. Will refer him to see Dr. Excell Seltzer to consider PFO closure. PT/OT recommend CIR.   Neurology will sign off. Please call with questions. Pt will follow up with stroke clinic NP at [facility] in about 4 weeks. Thanks for the consult.   Marvel Plan, MD PhD Stroke Neurology 11/14/2017 2:17 PM   To contact Stroke Continuity provider, please refer to [email or web address]. After hours, contact General Neurology

## 2017-11-14 NOTE — H&P (View-Only) (Signed)
STROKE TEAM PROGRESS NOTE   INTERVAL HISTORY Up with rehab team using RW. Doing well today with no c/o. He has some difficulty with fine motor and spacing as well as observed gait imbalance at this time.    Vitals:   11/13/17 2305 11/14/17 0312 11/14/17 0822 11/14/17 1130  BP: 122/84 118/86 127/81 136/84  Pulse: 83 81 72 83  Resp: 20 18 17 17   Temp: 98.6 F (37 C) 98.6 F (37 C) 98.3 F (36.8 C) 98.4 F (36.9 C)  TempSrc: Oral Oral  Oral  SpO2: 96% 98% 95% 97%  Weight:  105.2 kg    Height:        CBC:  Recent Labs  Lab 11/12/17 0728 11/12/17 0738 11/13/17 0415  WBC 10.8*  --  7.9  NEUTROABS 9.7*  --  5.3  HGB 14.9 14.3 14.7  HCT 42.4 42.0 43.2  MCV 81.2  --  82.3  PLT 280  --  273    Basic Metabolic Panel:  Recent Labs  Lab 11/12/17 0728 11/12/17 0738 11/13/17 0415  NA 138 138 139  K 3.8 3.8 3.5  CL 104 103 107  CO2 24  --  26  GLUCOSE 146* 142* 106*  BUN 15 14 8   CREATININE 0.96 0.90 0.97  CALCIUM 8.9  --  8.9  MG  --   --  2.3  PHOS  --   --  3.5   Lipid Panel:     Component Value Date/Time   CHOL 139 11/13/2017 0415   TRIG 101 11/13/2017 0415   HDL 34 (L) 11/13/2017 0415   CHOLHDL 4.1 11/13/2017 0415   VLDL 20 11/13/2017 0415   LDLCALC 85 11/13/2017 0415   HgbA1c:  Lab Results  Component Value Date   HGBA1C 5.0 11/13/2017   Urine Drug Screen:     Component Value Date/Time   LABOPIA (A) 11/12/2017 0658    Result not available. Reagent lot number recalled by manufacturer.   COCAINSCRNUR NONE DETECTED 11/12/2017 0658   LABBENZ NONE DETECTED 11/12/2017 0658   AMPHETMU NONE DETECTED 11/12/2017 0658   THCU NONE DETECTED 11/12/2017 0658   LABBARB NONE DETECTED 11/12/2017 0658    Alcohol Level     Component Value Date/Time   ETH <10 11/12/2017 0728    IMAGING reviewed in EMR:  Ct Head Wo Contrast 11/12/2017 0542 Suggestion of asymmetrical low-attenuation and possible mass effect in the left anterior cerebellum and cerebellar peduncle.  MRI is recommended for further evaluation. No acute intracranial hemorrhage or mass effect.   Ct Angio Head W Or Wo Contrast Ct Angio Neck W And/or Wo Contrast 11/12/2017 The patient does not show evidence of atherosclerotic vascular disease in general or of dissection. 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   Mr Brain Wo Contrast 11/12/2017 3.5 cm in diameter acute infarction in the superior cerebellum on the left. No evidence of significant swelling/mass effect or of any hemorrhage. Mild chronic small-vessel ischemic change of the cerebral hemispheric white matter. No large vessel vascular occlusion identified on this noncontrast study.   Ct Head Wo Contrast 11/12/2017 2306 1. Evolving LEFT cerebellar/SCA territory nonhemorrhagic infarct. Regional mass effect without hydrocephalus. 2. Borderline parenchymal brain volume loss for age. 3. Mild chronic small vessel ischemic changes.   2D Echocardiogram  - Left ventricle: The cavity size was normal. Systolic function was normal. The estimated ejection fraction was in the range of 60% to 65%. Wall motion was normal;  there were no regional wall motion abnormalities. Left ventricular diastolic function parameters were normal. - Mitral valve: There was trivial regurgitation. Valve area by pressure half-time: 1.43 cm^2. - Tricuspid valve: There was trivial regurgitation.  LE doppler There is no DVT or SVT noted in the bilateral lower extremities   PHYSICAL EXAM GENERAL: Awake, alert in NAD Lungs: Clear, normal respirations Ext: warm, well perfused, intact peripheral pulses HR: regular, mild systolic murmur  NEURO:  Mental Status: AA&Ox3, speech mildly dysarthric.  Naming, repetition, fluency, and comprehension intact. Cranial Nerves: PERRL 2 mm/brisk. EOMI, visual fields full, no nystagmus, no facial asymmetry, facial sensation intact, hearing intact, tongue/uvula/soft palate midline, Motor:  5/5 throughout. L arm orbits his right. Tone: is normal and bulk is normal Sensation- Intact to light touch bilaterally Coordination: Finger-to-nose was dysmetric on the left, heel-to-shin was dysmetric on the left Gait- unbalanced, distance distuberance   ASSESSMENT/PLAN Mr. Elvina SidleDavid G Laden is a 52 y.o. male with history of ETOH use, heart murmur, obesity presenting with dizziness, inability to walk and nausea & vomiting.  Stroke:  small left SCA infarct felt to be embolic secondary to unknown source  CT head possible abnoramlity L cerebellum/cerebellar peduncle  CTA head & neck occlusion L SCA beyond origin  MRI  L superior cerebellar infarct. Small vessel disease.   Repeat CT head evolving L cerebellar/SCA infarct. Borderline atrophy. Small vessel disease.   2D Echo  EF 60-65%. No source of embolus   LE doppler negative   TEE to look for embolic source. Arranged with [facility] for tomorrow.  (I have made patient NPO after midnight tonight).  Consider loop placement to look for AF.  LDL 85  HgbA1c 5.0  Lovenox 40 mg sq daily for VTE prophylaxis  No antithrombotic prior to admission, now on aspirin 325 mg daily. Given mild stroke, will place on aspirin 81 mg and plavix 75 mg daily x 3 weeks, then aspirin alone. Orders adjusted.   Therapy recommendations:  CIR. Consult in place  Disposition:  pending TEE, if neg then LINQ will be needed  Hyperlipidemia  Home meds:  No statin  Now on lipitor 20  LDL 85, goal < 70  Continue statin at discharge  Other Stroke Risk Factors  ETOH use, advised to drink no more than 2 drink(s) a day. Drinks 3-5 days/week  Obesity, Body mass index is 33.28 kg/m., recommend weight loss, diet and exercise as appropriate   NO Hx stroke/TIA prior to this admission  Other Active Problems  Leukocytosis 10.8  Hyperbilirubinemia 1.9  Hospital day # 3  Troyce Gieske Metzger-Cihelka, MSN, ARNP-C Advanced Practice  Stroke Nurse [facility] See Amion for Schedule & Pager information 11/14/2017 12:12 PM   ATTENDING NOTE: I reviewed above note and agree with the assessment and plan. I have made any additions or clarifications directly to the above note. Pt was seen and examined.   52 year old male with history of alcohol use and not seeing doctor for years presented with dizziness, difficulty walking, and nausea vomiting.  CT showed left cerebellar infarcts.  MRI confirmed left SCA territory infarct.  CT head and neck showed left ICA occlusion.  Repeat CT stable, no hydrocephalus.  EF 60 to 65%.  LDL 85 and A1c 5.0 UDS negative.  LE venous Doppler negative for DVT.  Patient examination only showed left upper extremity finger-to-nose mild dysmetria.  No other focal neurological deficit.  Patient stroke concerning for embolic pattern.  Given young age and  the lack of significant stroke risk factors, will do hypercoagulable work-up as well as TEE and loop recorder.  Will place on aspirin 81 and Plavix 75 for 3 weeks and then aspirin alone.  On low-dose statin.  Will follow  Maximino Cozzolino Metzger-Cihelka, ARNP-C Stroke Neurology 11/14/2017 12:12 PM  To contact Stroke Continuity provider, please refer to [email or web address]. After hours, contact General Neurology

## 2017-11-14 NOTE — Interval H&P Note (Signed)
History and Physical Interval Note:  11/14/2017 12:23 PM  John Mcbride  has presented today for surgery, with the diagnosis of stroke  The various methods of treatment have been discussed with the patient and family. After consideration of risks, benefits and other options for treatment, the patient has consented to  Procedure(s): TRANSESOPHAGEAL ECHOCARDIOGRAM (TEE) (N/A) as a surgical intervention .  The patient's history has been reviewed, patient examined, no change in status, stable for surgery.  I have reviewed the patient's chart and labs.  Questions were answered to the patient's satisfaction.     Dietrich PatesPaula Ayvion Kavanagh

## 2017-11-14 NOTE — Consult Note (Addendum)
ELECTROPHYSIOLOGY CONSULT NOTE  Patient ID: John Mcbride MRN: [id number], DOB/AGE: [birth year]   Admit date: 11/12/2017 Date of Consult: 11/14/2017  Primary Physician: Patient, No Pcp Per Primary Cardiologist: none Reason for Consultation: Cryptogenic stroke ; recommendations regarding Implantable Loop Recorder requested by Dr. Roda ShuttersXu  History of Present Illness John Mcbride was admitted on 11/12/2017 with dizziness. PMHx is negative outside of a childhood murmur, and ETOH use.  They first developed symptoms while at home.  Imaging demonstrated small left SCA infarct felt to be embolic secondary to unknown source .  he has undergone workup for stroke including echocardiogram and carotid angio.  The patient has been monitored on telemetry which has demonstrated sinus rhythm with no arrhythmias.  Inpatient stroke work-up is to be completed with a TEE.   Echocardiogram this admission demonstrated   Study Conclusions - Left ventricle: The cavity size was normal. Systolic function was   normal. The estimated ejection fraction was in the range of 60%   to 65%. Wall motion was normal; there were no regional wall   motion abnormalities. Left ventricular diastolic function   parameters were normal. - Mitral valve: There was trivial regurgitation. Valve area by   pressure half-time: 1.43 cm^2. - Tricuspid valve: There was trivial regurgitation.  11/13/17: LE Venous US Final Interpretation: Right: There is no evidence of deep vein thrombosis in the lower extremity. Left: There is no evidence of deep vein thrombosis in the lower extremity.  Lab work is reviewed.  Prior to admission, the patient denies chest pain, shortness of breath, dizziness, palpitations, or syncope.  He is recovering from the stroke, remains with some dizziness, though much improved with plans to CIR at discharge.   Past Medical History:  Diagnosis Date  . Heart murmur      Surgical History: History reviewed. No  pertinent surgical history.   No medications prior to admission.    Inpatient Medications:  . aspirin EC  81 mg Oral Daily  . atorvastatin  20 mg Oral q1800  . clopidogrel  75 mg Oral Daily  . enoxaparin (LOVENOX) injection  40 mg Subcutaneous Q24H    Allergies: No Known Allergies  Social History   Socioeconomic History  . Marital status: Single    Spouse name: Not on file  . Number of children: Not on file  . Years of education: Not on file  . Highest education level: Not on file  Occupational History  . Occupation: Psychologist, counsellingairplane engineer    Employer: [employer]  Social Needs  . Financial resource strain: Not hard at all  . Food insecurity:    Worry: Never true    Inability: Never true  . Transportation needs:    Medical: No    Non-medical: No  Tobacco Use  . Smoking status: Never Smoker  . Smokeless tobacco: Never Used  Substance and Sexual Activity  . Alcohol use: Yes    Alcohol/week: 10.0 standard drinks    Types: 10 Standard drinks or equivalent per week  . Drug use: No  . Sexual activity: Not on file  Lifestyle  . Physical activity:    Days per week: 0 days    Minutes per session: 0 min  . Stress: Not at all  Relationships  . Social connections:    Talks on phone: More than three times a week    Gets together: More than three times a week    Attends religious service: Patient refused    Active member of club  or organization: Patient refused    Attends meetings of clubs or organizations: Patient refused    Relationship status: Patient refused  . Intimate partner violence:    Fear of current or ex partner: Patient refused    Emotionally abused: Patient refused    Physically abused: Patient refused    Forced sexual activity: Patient refused  Other Topics Concern  . Not on file  Social History Narrative  . Not on file     Family History  Problem Relation Age of Onset  . Cancer Mother   . Cancer Father   . Heart disease Father       Review of  Systems: All other systems reviewed and are otherwise negative except as noted above.  Physical Exam: Vitals:   11/13/17 1950 11/13/17 2305 11/14/17 0312 11/14/17 0822  BP: 127/89 122/84 118/86 127/81  Pulse: 92 83 81 72  Resp: 18 20 18 17   Temp: 98.4 F (36.9 C) 98.6 F (37 C) 98.6 F (37 C) 98.3 F (36.8 C)  TempSrc: Oral Oral Oral   SpO2: 97% 96% 98% 95%  Weight:   105.2 kg   Height:        GEN- The patient is well appearing, alert and oriented x 3 today.   Head- normocephalic, atraumatic Eyes-  Sclera clear, conjunctiva pink Ears- hearing intact Oropharynx- clear Neck- supple Lungs- CTA b/l, normal work of breathing Heart- RRR, no murmurs, rubs or gallops  GI- soft, NT, ND Extremities- no clubbing, cyanosis, or edema MS- no significant deformity or atrophy Skin- no rash or lesion Psych- euthymic mood, full affect   Labs:   Lab Results  Component Value Date   WBC 7.9 11/13/2017   HGB 14.7 11/13/2017   HCT 43.2 11/13/2017   MCV 82.3 11/13/2017   PLT 273 11/13/2017    Recent Labs  Lab 11/13/17 0415  NA 139  K 3.5  CL 107  CO2 26  BUN 8  CREATININE 0.97  CALCIUM 8.9  PROT 6.4*  BILITOT 2.7*  ALKPHOS 42  ALT 21  AST 16  GLUCOSE 106*   No results found for: CKTOTAL, CKMB, CKMBINDEX, TROPONINI Lab Results  Component Value Date   CHOL 139 11/13/2017   Lab Results  Component Value Date   HDL 34 (L) 11/13/2017   Lab Results  Component Value Date   LDLCALC 85 11/13/2017   Lab Results  Component Value Date   TRIG 101 11/13/2017   Lab Results  Component Value Date   CHOLHDL 4.1 11/13/2017   No results found for: LDLDIRECT  No results found for: DDIMER   Radiology/Studies:   Ct Angio Head W Or Wo Contrast Result Date: 11/12/2017 CLINICAL DATA:  Acute left cerebellar infarction. Dizziness, nausea and vomiting. EXAM: CT ANGIOGRAPHY HEAD AND NECK TECHNIQUE: Multidetector CT imaging of the head and neck was performed using the standard  protocol during bolus administration of intravenous contrast. Multiplanar CT image reconstructions and MIPs were obtained to evaluate the vascular anatomy. Carotid stenosis measurements (when applicable) are obtained utilizing NASCET criteria, using the distal internal carotid diameter as the denominator. CONTRAST:  80mL ISOVUE-370 IOPAMIDOL (ISOVUE-370) INJECTION 76% COMPARISON:  CT and MR study same day. FINDINGS: CTA NECK FINDINGS Aortic arch: Normal Right carotid system: Common carotid artery widely patent to the bifurcation. The carotid bifurcation is normal without soft or calcified plaque. Cervical internal carotid artery is tortuous but widely patent. Left carotid system: Common carotid artery widely patent to the bifurcation. Carotid bifurcation is  normal without soft or calcified plaque. Cervical ICA is tortuous but widely patent. Vertebral arteries: Both subclavian arteries appear normal. Both vertebral artery origins are widely patent. The vertebral arteries are approximately equal in size an widely patent through the cervical region to the foramen magnum. Skeleton: Ordinary cervical spondylosis. Other neck: No mass or lymphadenopathy. Upper chest: Normal Review of the MIP images confirms the above findings CTA HEAD FINDINGS Anterior circulation: Both internal carotid arteries are widely patent through the skull base and siphon regions. The anterior and middle cerebral vessels are patent without proximal stenosis, aneurysm or vascular malformation. Posterior circulation: Both vertebral arteries are widely patent at and through the foramen magnum to the basilar. No basilar stenosis. There are diminutive posterior inferior cerebellar arteries. There are bilateral anterior inferior cerebellar arteries which are patent. Right superior cerebellar artery is normal. Left superior cerebellar artery is occluded just beyond its origin. Both posterior inferior cerebellar arteries are patent and normal. Venous  sinuses: Patent and normal. Anatomic variants: None significant. Delayed phase: No abnormal enhancement. Review of the MIP images confirms the above findings IMPRESSION: The patient does not show evidence of atherosclerotic vascular disease in general or of dissection. 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 Electronically Signed   By: Paulina Fusi M.D.   On: 11/12/2017 08:58    Ct Head Wo Contrast Result Date: 11/12/2017 CLINICAL DATA:  Follow up stroke. EXAM: CT HEAD WITHOUT CONTRAST TECHNIQUE: Contiguous axial images were obtained from the base of the skull through the vertex without intravenous contrast. COMPARISON:  CT HEAD and MRI head November 12, 2017 FINDINGS: BRAIN: LEFT superior cerebellar hypodensity corresponding to known acute infarct. Regional mass effect with slight mass effect on fourth ventricle and cerebral aqueduct. No hydrocephalus. Borderline parenchymal brain volume loss for age. Patchy supratentorial white matter hypodensities. No intraparenchymal hemorrhage, midline shift. No acute large vascular territory infarcts. Basal cisterns are patent. VASCULAR: Mild calcific atherosclerosis carotid siphon. SKULL/SOFT TISSUES: No skull fracture. No significant soft tissue swelling. ORBITS/SINUSES: The included ocular globes and orbital contents are normal.Trace paranasal sinus mucosal thickening. Mastoid air cells are well aerated. OTHER: None. IMPRESSION: 1. Evolving LEFT cerebellar/SCA territory nonhemorrhagic infarct. Regional mass effect without hydrocephalus. 2. Borderline parenchymal brain volume loss for age. 3. Mild chronic small vessel ischemic changes. Electronically Signed   By: Awilda Metro M.D.   On: 11/12/2017 23:06   Ct Head Wo Contrast Result Date: 11/12/2017 CLINICAL DATA:  Dizziness. Nausea and vomiting. Hydraulic fluid in the ear at work. EXAM: CT HEAD WITHOUT CONTRAST TECHNIQUE: Contiguous axial images were obtained from  the base of the skull through the vertex without intravenous contrast. COMPARISON:  None. FINDINGS: Brain: There is suggestion of asymmetrical low-attenuation and possible mass effect in the left anterior cerebellum and cerebellar peduncle. This could represent an area of acute infarct or possibly a mass lesion. MRI is suggested for further evaluation. Intracranial contents are otherwise unremarkable. No ventricular dilatation. No abnormal extra-axial fluid collections. Gray-white matter junctions are distinct. Basal cisterns are not effaced. No acute intracranial hemorrhage. Vascular: No hyperdense vessel or unexpected calcification. Skull: Normal. Negative for fracture or focal lesion. Sinuses/Orbits: Mild mucosal thickening in the paranasal sinuses. No acute air-fluid levels. Mastoid air cells and middle ear cavities are clear. Other: None. IMPRESSION: Suggestion of asymmetrical low-attenuation and possible mass effect in the left anterior cerebellum and cerebellar peduncle. MRI is recommended for further evaluation. No acute intracranial hemorrhage or mass effect. Electronically Signed  By: Burman NievesWilliam  Stevens M.D.   On: 11/12/2017 05:42    Mr Brain Wo Contrast Result Date: 11/12/2017 CLINICAL DATA:  Acute presentation with dizziness, nausea and vomiting. Hydraulic fluid in the ear at work. EXAM: MRI HEAD WITHOUT CONTRAST TECHNIQUE: Multiplanar, multiecho pulse sequences of the brain and surrounding structures were obtained without intravenous contrast. COMPARISON:  CT same day FINDINGS: Brain: 3.5 cm in diameter acute infarction affecting the superior cerebellum on the left. No other acute infarction. No significant mass effect. No sign of hemorrhage. No abnormal finding affecting the brainstem. Cerebral hemispheres show scattered foci of T2 and FLAIR signal in the deep white matter consistent with early small vessel ischemic change of a chronic nature. No mass lesion, hydrocephalus or extra-axial collection.  Vascular: Major vessels at the base of the brain show flow. Skull and upper cervical spine: Normal Sinuses/Orbits: Minimal mucosal inflammatory changes of the left maxillary sinus. Other: None IMPRESSION: 3.5 cm in diameter acute infarction in the superior cerebellum on the left. No evidence of significant swelling/mass effect or of any hemorrhage. Mild chronic small-vessel ischemic change of the cerebral hemispheric white matter. No large vessel vascular occlusion identified on this noncontrast study. Electronically Signed   By: Paulina FusiMark  Shogry M.D.   On: 11/12/2017 07:09     12-lead ECG SR All prior EKG's in EPIC reviewed with no documented atrial fibrillation  Telemetry SR  Assessment and Plan:  1. Cryptogenic stroke The patient presents with cryptogenic stroke.  The patient has a TEE planned for this AM.  I spoke at length with the patient about monitoring for afib with either a 30 day event monitor or an implantable loop recorder.  Risks, benefits, and alteratives to implantable loop recorder were discussed with the patient today.   At this time, the patient is very clear in his decision to proceed with implantable loop recorder.   Wound care was reviewed with the patient (keep incision clean and dry for 3 days).  Wound check will be scheduled for the patient  Please call with questions.   Renee Norberto SorensonLynn Ursuy, PA-C 11/14/2017  Seen evaluated.  Discussed with Dr. Roda ShuttersXu.  The patient has a cryptogenic stroke in the least moderate sized PFO.  His ROPE score is 7 making PFO contribution to his cryptogenic stroke quite likely.  Furthermore, his risk factors for atrial fibrillation are minimal.  Hence, I think it is most reasonable to proceed with evaluation for PFO closure.  I am not sure that monitoring is going to be useful in addition; there is a significant increased risk of atrial arrhythmias following PFO closure and it would be unlikely that atrial fibrillation this young man would have been  related to his cryptogenic stroke.  We will refer him to structural heart clinic for consideration of closure of his PFO

## 2017-11-14 NOTE — Progress Notes (Signed)
  Echocardiogram Echocardiogram Transesophageal has been performed.  Belva ChimesWendy  Amali Uhls 11/14/2017, 1:05 PM

## 2017-11-14 NOTE — Transfer of Care (Signed)
Immediate Anesthesia Transfer of Care Note  Patient: John Mcbride  Procedure(s) Performed: TRANSESOPHAGEAL ECHOCARDIOGRAM (TEE) (N/A )  Patient Location: Endoscopy Unit  Anesthesia Type:MAC  Level of Consciousness: awake, alert  and oriented  Airway & Oxygen Therapy: Patient Spontanous Breathing and Patient connected to nasal cannula oxygen  Post-op Assessment: Report given to RN, Post -op Vital signs reviewed and stable and Patient moving all extremities X 4  Post vital signs: Reviewed and stable  Last Vitals:  Vitals Value Taken Time  BP 106/78 11/14/2017 12:49 PM  Temp    Pulse 95 11/14/2017 12:51 PM  Resp 17 11/14/2017 12:51 PM  SpO2 94 % 11/14/2017 12:51 PM  Vitals shown include unvalidated device data.  Last Pain:  Vitals:   11/14/17 1130  TempSrc: Oral  PainSc: 0-No pain         Complications: No apparent anesthesia complications

## 2017-11-14 NOTE — Interval H&P Note (Signed)
History and Physical Interval Note:  11/14/2017 10:05 AM  John Mcbride  has presented today for surgery, with the diagnosis of stroke  The various methods of treatment have been discussed with the patient and family. After consideration of risks, benefits and other options for treatment, the patient has consented to  Procedure(s): TRANSESOPHAGEAL ECHOCARDIOGRAM (TEE) (N/A) as a surgical intervention .  The patient's history has been reviewed, patient examined, no change in status, stable for surgery.  I have reviewed the patient's chart and labs.  Questions were answered to the patient's satisfaction.     Dietrich PatesPaula Vonna Brabson

## 2017-11-14 NOTE — Progress Notes (Signed)
Patient ID: John SidleDavid G Mcbride, male   DOB: 11/26/1965, 52 y.o.   MRN: [id number] Patient admitted to [phone] via wheelchair, escorted by nursing staff.  Patient oriented to unit with specific emphasis on fall prevention policy, visitation policy, personal belongings policy.  Patient verbalized understanding.  Appears to be in no immediate distress at this time.  John Mcbride, John Oshel J, RN

## 2017-11-14 NOTE — Progress Notes (Signed)
SLP Cancellation Note  Patient Details Name: John SidleDavid G Viele MRN: [id number] DOB: 02/06/1966   Cancelled treatment:       Reason Eval/Treat Not Completed: Patient at procedure or test/unavailable, Unable to complete SLE at this time, as pt is currently off unit for testing. Chart review indicates pt to transfer to CIR today. Will defer SLE to speech therapy team on rehab.  Celia B. Murvin NatalBueche, [facility] MadisonvilleMSP, CCC-SLP Speech Language Pathologist [phone]  Leigh AuroraBueche, Celia Brown 11/14/2017, 11:35 AM

## 2017-11-14 NOTE — Anesthesia Preprocedure Evaluation (Addendum)
Anesthesia Evaluation  Patient identified by MRN, date of birth, ID band Patient awake    Reviewed: Allergy & Precautions, NPO status , Patient's Chart, lab work & pertinent test results  History of Anesthesia Complications Negative for: history of anesthetic complications  Airway Mallampati: II  TM Distance: >3 FB Neck ROM: Full    Dental no notable dental hx. (+) Dental Advisory Given, Poor Dentition, Missing   Pulmonary neg pulmonary ROS,    Pulmonary exam normal        Cardiovascular + Peripheral Vascular Disease  Normal cardiovascular exam  Study Conclusions  - Left ventricle: The cavity size was normal. Systolic function was   normal. The estimated ejection fraction was in the range of 60%   to 65%. Wall motion was normal; there were no regional wall   motion abnormalities. Left ventricular diastolic function   parameters were normal. - Mitral valve: There was trivial regurgitation. Valve area by   pressure half-time: 1.43 cm^2. - Tricuspid valve: There was trivial regurgitation.    Neuro/Psych CVA negative neurological ROS  negative psych ROS   GI/Hepatic negative GI ROS, Neg liver ROS,   Endo/Other  negative endocrine ROS  Renal/GU negative Renal ROS  negative genitourinary   Musculoskeletal negative musculoskeletal ROS (+)   Abdominal   Peds negative pediatric ROS (+)  Hematology negative hematology ROS (+)   Anesthesia Other Findings   Reproductive/Obstetrics negative OB ROS                            Anesthesia Physical Anesthesia Plan  ASA: III  Anesthesia Plan: MAC   Post-op Pain Management:    Induction: Intravenous  PONV Risk Score and Plan: Ondansetron and Propofol infusion  Airway Management Planned: Natural Airway, Simple Face Mask and Nasal Cannula  Additional Equipment:   Intra-op Plan:   Post-operative Plan:   Informed Consent: I have reviewed  the patients History and Physical, chart, labs and discussed the procedure including the risks, benefits and alternatives for the proposed anesthesia with the patient or authorized representative who has indicated his/her understanding and acceptance.   Dental advisory given  Plan Discussed with: CRNA and Anesthesiologist  Anesthesia Plan Comments:         Anesthesia Quick Evaluation

## 2017-11-14 NOTE — Anesthesia Postprocedure Evaluation (Signed)
Anesthesia Post Note  Patient: John Mcbride  Procedure(s) Performed: TRANSESOPHAGEAL ECHOCARDIOGRAM (TEE) (N/A )     Patient location during evaluation: Endoscopy Anesthesia Type: MAC Level of consciousness: awake and alert Pain management: pain level controlled Vital Signs Assessment: post-procedure vital signs reviewed and stable Respiratory status: spontaneous breathing and respiratory function stable Cardiovascular status: stable Postop Assessment: no apparent nausea or vomiting Anesthetic complications: no    Last Vitals:  Vitals:   11/14/17 1252 11/14/17 1300  BP: 106/78 116/79  Pulse: 95 93  Resp: 17 17  Temp: 36.9 C   SpO2: 94% 98%    Last Pain:  Vitals:   11/14/17 1300  TempSrc:   PainSc: 0-No pain                 [name] DANIEL

## 2017-11-14 NOTE — Progress Notes (Signed)
Physical Therapy Treatment Patient Details Name: John SidleDavid G Vereen MRN: [id number] DOB: 1965-09-12 Today's Date: 11/14/2017    History of Present Illness Patient is a 52 y/o male presenting to the ED on 11/12/17 due to dizziness, N&V, and imbalance. Head CT w/o Contrast showed symmetrical low-attenuation and possible mass-effect in left anterior cerebellum and cerebellar peduncle. 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  PMH significant for heart murmur of unknown type, alcohol use.     PT Comments    Patient seen for mobility progression. This session focused grossly on gait and functional transfers. Pt continues to present with ataxic gait, dizziness, and poor proprioception. Pt will continue to benefit from further skilled PT services to maximize independence and safety with mobility. Current plan remains appropriate.   Follow Up Recommendations  CIR     Equipment Recommendations  Other (comment)(TBD)    Recommendations for Other Services Rehab consult     Precautions / Restrictions Precautions Precautions: Fall    Mobility  Bed Mobility Overal bed mobility: Needs Assistance Bed Mobility: Supine to Sit     Supine to sit: Min guard     General bed mobility comments: use of rail   Transfers Overall transfer level: Needs assistance Equipment used: Rolling walker (2 wheeled) Transfers: Sit to/from Stand Sit to Stand: Min assist;+2 physical assistance         General transfer comment: assistance to power up into standing and for balance  Ambulation/Gait Ambulation/Gait assistance: Mod assist;+2 safety/equipment;+2 physical assistance Gait Distance (Feet): 150 Feet(seated rest break) Assistive device: Rolling walker (2 wheeled);2 person hand held assist Gait Pattern/deviations: Step-through pattern;Decreased stride length;Staggering left;Staggering right;Ataxic Gait velocity: decreased   General Gait  Details: pt continues to have ataxic gait and requires assistance for balance and weight shifting   Stairs             Wheelchair Mobility    Modified Rankin (Stroke Patients Only) Modified Rankin (Stroke Patients Only) Pre-Morbid Rankin Score: No symptoms Modified Rankin: Moderately severe disability     Balance Overall balance assessment: Needs assistance Sitting-balance support: Bilateral upper extremity supported;Feet supported Sitting balance-Leahy Scale: Fair     Standing balance support: Bilateral upper extremity supported;During functional activity Standing balance-Leahy Scale: Poor                              Cognition Arousal/Alertness: Awake/alert Behavior During Therapy: WFL for tasks assessed/performed Overall Cognitive Status: Within Functional Limits for tasks assessed                                        Exercises      General Comments        Pertinent Vitals/Pain Pain Assessment: No/denies pain    Home Living                      Prior Function            PT Goals (current goals can now be found in the care plan section) Acute Rehab PT Goals PT Goal Formulation: With patient Time For Goal Achievement: 11/26/17 Potential to Achieve Goals: Good Progress towards PT goals: Progressing toward goals    Frequency    Min 4X/week      PT Plan Current plan remains appropriate  Co-evaluation              AM-PAC PT "6 Clicks" Daily Activity  Outcome Measure  Difficulty turning over in bed (including adjusting bedclothes, sheets and blankets)?: A Little Difficulty moving from lying on back to sitting on the side of the bed? : Unable Difficulty sitting down on and standing up from a chair with arms (e.g., wheelchair, bedside commode, etc,.)?: Unable Help needed moving to and from a bed to chair (including a wheelchair)?: A Little Help needed walking in hospital room?: A Lot Help needed  climbing 3-5 steps with a railing? : A Lot 6 Click Score: 12    End of Session Equipment Utilized During Treatment: Gait belt Activity Tolerance: Patient tolerated treatment well Patient left: in chair;with call bell/phone within reach;with chair alarm set Nurse Communication: Mobility status PT Visit Diagnosis: Unsteadiness on feet (R26.81);Other abnormalities of gait and mobility (R26.89);Muscle weakness (generalized) (M62.81)     Time: 1000-1027 PT Time Calculation (min) (ACUTE ONLY): 27 min  Charges:  $Gait Training: 23-37 mins                     Erline LevineKellyn Yamari Ventola, PTA Pager: [phone]     Carolynne EdouardKellyn R Dicky Boer 11/14/2017, 1:45 PM

## 2017-11-15 ENCOUNTER — Inpatient Hospital Stay (HOSPITAL_COMMUNITY): Payer: BLUE CROSS/BLUE SHIELD | Admitting: Physical Therapy

## 2017-11-15 ENCOUNTER — Inpatient Hospital Stay (HOSPITAL_COMMUNITY): Payer: BLUE CROSS/BLUE SHIELD

## 2017-11-15 DIAGNOSIS — K760 Fatty (change of) liver, not elsewhere classified: Secondary | ICD-10-CM

## 2017-11-15 DIAGNOSIS — G119 Hereditary ataxia, unspecified: Secondary | ICD-10-CM

## 2017-11-15 DIAGNOSIS — I639 Cerebral infarction, unspecified: Secondary | ICD-10-CM

## 2017-11-15 DIAGNOSIS — I10 Essential (primary) hypertension: Secondary | ICD-10-CM

## 2017-11-15 NOTE — Evaluation (Signed)
Occupational Therapy Assessment and Plan  Patient Details  Name: John Mcbride MRN: [id number] Date of Birth: [birth year]  OT Diagnosis: muscle weakness (generalized) and coordination deficits Rehab Potential: Rehab Potential (ACUTE ONLY): Good ELOS: 5-7 days   Today's Date: 11/15/2017 OT Individual Time: 9417-4081 OT Individual Time Calculation (min): 60 min     Problem List:  Patient Active Problem List   Diagnosis Date Noted  . Cerebellar ataxia ([place]) 11/14/2017  . Cerebellar infarct ([place])   . Essential hypertension   . Cerebellar stroke ([place]) 11/12/2017  . Nausea and vomiting 11/12/2017  . Dizziness and giddiness 11/12/2017  . Hyperglycemia 11/12/2017  . Leukocytosis 11/12/2017  . Alcohol use 11/12/2017    Past Medical History:  Past Medical History:  Diagnosis Date  . Heart murmur    Past Surgical History: History reviewed. No pertinent surgical history.  Assessment & Plan Clinical Impression: John Mcbride. Gleed is a 52 year old right-handed male with history of heart murmur, alcohol use on no prescription medications. Patient lives with roommates. Independent prior to admission. Works full-time Chief Financial Officer. One level home. He does have parents in the area that check on him as needed with good support of roommates. Presented 11/12/2017 with nausea, vomiting as well as dizziness and incoordination. Cranial CT scan showed suggestion of asymmetrical low-attenuation and possible mass-effect in the left anterior cerebellum and cerebellar peduncle. MRI showed a 3.5 cm diameter acute infarction in the superior cerebellum on the left. No evidence of mass-effect or hemorrhage. CT angiogram of head and neck with no large vessel occlusion. Patient did not receive TPA. Echocardiogram with ejection fraction of 65% no wall motion abnormalities. Neurology consulted maintained on aspirin and Plavix for CVA prophylaxis x3 weeks then aspirin alone. Subcutaneous. Lovenox for DVT prophylaxis.  Venous Doppler studies lower extremities negative. TEE to be completed today. Tolerating a regular diet. Physical and occupational therapy evaluations completed with recommendations of physical medicine rehab consult. Patient was admitted for a comprehensive rehab program. Patient transferred to CIR on 11/14/2017 .    Patient currently requires min with basic self-care skills and IADL secondary to muscle weakness, decreased cardiorespiratoy endurance, impaired timing and sequencing, motor apraxia, ataxia and decreased coordination and decreased standing balance, decreased postural control and decreased balance strategies.  Prior to hospitalization, patient could complete ADL's and IADLs independently .  Patient will benefit from skilled intervention to decrease level of assist with basic self-care skills and increase level of independence with iADL prior to discharge home with care partner.  Anticipate patient will require intermittent supervision and no further OT follow recommended.  OT - End of Session Activity Tolerance: Tolerates 30+ min activity with multiple rests Endurance Deficit: Yes Endurance Deficit Description: generalized weakness OT Assessment Rehab Potential (ACUTE ONLY): Good OT Patient demonstrates impairments in the following area(s): Balance;Safety;Motor OT Basic ADL's Functional Problem(s): Bathing;Dressing;Toileting OT Advanced ADL's Functional Problem(s): Simple Meal Preparation OT Transfers Functional Problem(s): Toilet;Tub/Shower OT Additional Impairment(s): None OT Plan OT Intensity: Minimum of 1-2 x/day, 45 to 90 minutes OT Frequency: 5 out of 7 days OT Duration/Estimated Length of Stay: 5-7 days OT Treatment/Interventions: Balance/vestibular training;Self Care/advanced ADL retraining;Therapeutic Exercise;DME/adaptive equipment instruction;Community reintegration;Patient/family education;UE/LE Coordination activities;Discharge planning;Functional mobility  training;Therapeutic Activities;Psychosocial support;Visual/perceptual remediation/compensation;Disease mangement/prevention;UE/LE Strength taining/ROM OT Self Feeding Anticipated Outcome(s): Independent OT Basic Self-Care Anticipated Outcome(s): mod I OT Toileting Anticipated Outcome(s): mod I OT Bathroom Transfers Anticipated Outcome(s): mod I OT Recommendation Patient destination: Home Follow Up Recommendations: None Equipment Recommended: Tub/shower bench;To be determined;Tub/shower seat Equipment Details: Some type  of shower seat, TBD whether for the walk in or tub shower   Skilled Therapeutic Intervention Pt seen for skilled OT evaluation. Education provided re OT POC, ELOS, goal planning, and rehab expectations. Pt completed shower this session, with CGA provided to complete functional mobility and stand pivot onto TTB in walk in shower. Pt able to complete UB/LB bathing with (S). Pt demo-ing good safety awareness during transfers. Pt left sitting EOB with edu provided re importance of calling for help, pt verbalized understanding. Bed alarm set, safety plan updated, and all needs met.   OT Evaluation Precautions/Restrictions  Precautions Precautions: Fall Restrictions Weight Bearing Restrictions: No General Chart Reviewed: Yes Family/Caregiver Present: No Vital Signs Therapy Vitals Temp: 98.3 F (36.8 C) Temp Source: Oral Pulse Rate: 74 Resp: 18 BP: (!) 126/95 Patient Position (if appropriate): Sitting Oxygen Therapy SpO2: 96 % O2 Device: Room Air Pain Pain Assessment Pain Scale: 0-10 Pain Score: 0-No pain Home Living/Prior Functioning Home Living Family/patient expects to be discharged to:: Private residence Living Arrangements: Non-relatives/Friends Available Help at Discharge: Family, Friend(s), Available PRN/intermittently Type of Home: House Home Access: Stairs to enter Entrance Stairs-Number of Steps: 3 Entrance Stairs-Rails: Left Home Layout: One  level Bathroom Shower/Tub: Tub/shower unit, Walk-in shower Bathroom Toilet: Standard Bathroom Accessibility: Yes Additional Comments: lives with best friend "John" - mom/ dad live down the street and are retired. pt works on air planes for a living  Lives With: Friend(s) IADL History Homemaking Responsibilities: Yes Meal Prep Responsibility: Primary Laundry Responsibility: Primary Cleaning Responsibility: Primary Bill Paying/Finance Responsibility: Primary Shopping Responsibility: Primary Current License: Yes Mode of Transportation: Car Occupation: Full time employment Type of Occupation: airplane mechanic Prior Function Level of Independence: Independent with basic ADLs, Independent with homemaking with ambulation  Able to Take Stairs?: Yes Driving: Yes Vocation: Full time employment Vocation Requirements: some FM demands, not much heavy lifting  Leisure: Hobbies-yes (Comment) Comments: enjoys riding four wheelers ADL ADL ADL Comments: See functional navigator Vision Baseline Vision/History: No visual deficits Patient Visual Report: Diplopia Vision Assessment?: Yes Eye Alignment: Within Functional Limits Ocular Range of Motion: Within Functional Limits Saccades: Additional head turns occurred during testing Convergence: Within functional limits Visual Fields: No apparent deficits Diplopia Assessment: Other (comment)(reports occasional diplopia but none reported during testing) Perception  Perception: Within Functional Limits Praxis Praxis: Intact Cognition Overall Cognitive Status: Within Functional Limits for tasks assessed Arousal/Alertness: Awake/alert Orientation Level: Person;Place;Situation Person: Oriented Place: Oriented Situation: Oriented Year: 2019 Month: August Day of Week: Correct Memory: Appears intact Immediate Memory Recall: Sock;Blue;Bed Memory Recall: Sock;Blue;Bed Memory Recall Sock: Without Cue Memory Recall Blue: Without Cue Memory Recall  Bed: Without Cue Attention: Alternating Awareness: Appears intact Problem Solving: Appears intact Safety/Judgment: Appears intact Sensation Sensation Light Touch: Appears Intact Coordination Gross Motor Movements are Fluid and Coordinated: No Fine Motor Movements are Fluid and Coordinated: No Coordination and Movement Description: slow, labored Finger Nose Finger Test: slow Motor  Motor Motor: Other (comment) Motor - Skilled Clinical Observations: generalized weakness, reduced coordination Mobility  Bed Mobility Bed Mobility: Supine to Sit Supine to Sit: Set up assist;Other (comment)(HOB elevated, use of bed rails) Transfers Sit to Stand: Contact Guard/Touching assist Stand to Sit: Contact Guard/Touching assist  Trunk/Postural Assessment  Cervical Assessment Cervical Assessment: Within Functional Limits Thoracic Assessment Thoracic Assessment: Within Functional Limits Lumbar Assessment Lumbar Assessment: Within Functional Limits Postural Control Postural Control: Deficits on evaluation Righting Reactions: delayed Protective Responses: delayed Postural Limitations: delayed  Balance Balance Balance Assessed: Yes Static Sitting Balance Static Sitting -   Balance Support: Feet supported Static Sitting - Level of Assistance: 5: Stand by assistance Static Sitting - Comment/# of Minutes: 10+ during distal LE reaching  Dynamic Sitting Balance Dynamic Sitting - Balance Support: During functional activity;Feet supported Dynamic Sitting - Level of Assistance: 5: Stand by assistance Dynamic Sitting - Balance Activities: Reaching for objects Static Standing Balance Static Standing - Balance Support: During functional activity;Bilateral upper extremity supported Static Standing - Level of Assistance: 4: Min assist Dynamic Standing Balance Dynamic Standing - Balance Support: During functional activity;Bilateral upper extremity supported Dynamic Standing - Level of Assistance: 4:  Min assist Dynamic Standing - Balance Activities: Reaching for objects Extremity/Trunk Assessment RUE Assessment RUE Assessment: Exceptions to WFL General Strength Comments: generalized weakness, slow coordination LUE Assessment LUE Assessment: Exceptions to WFL General Strength Comments: generalized weakness, slow coordination   See Function Navigator for Current Functional Status.   Refer to Care Plan for Long Term Goals  Recommendations for other services: None    Discharge Criteria: Patient will be discharged from OT if patient refuses treatment 3 consecutive times without medical reason, if treatment goals not met, if there is a change in medical status, if patient makes no progress towards goals or if patient is discharged from hospital.  The above assessment, treatment plan, treatment alternatives and goals were discussed and mutually agreed upon: by patient  Sandra H Davis 11/15/2017, 8:58 AM  

## 2017-11-15 NOTE — Progress Notes (Signed)
Subjective/Complaints: No issues overnite , eating and drinking well.  No IV meds  ROS - Denies CP,SOB.N/V/D  Objective: Vital Signs: Blood pressure (!) 126/95, pulse 74, temperature 98.3 F (36.8 C), temperature source Oral, resp. rate 18, height _0  (1.778 m), weight 98.5 kg, SpO2 96 %. US Abdomen Limited Ruq  Result Date: 11/13/2017 CLINICAL DATA:  Bilirubinemia EXAM: ULTRASOUND ABDOMEN LIMITED RIGHT UPPER QUADRANT COMPARISON:  None. FINDINGS: Gallbladder: No gallstones or wall thickening visualized. No sonographic Murphy sign noted by sonographer. Common bile duct: Diameter: Normal caliber, 4 mm Liver: Heterogeneous, increased echotexture compatible with fatty infiltration. No focal abnormality or biliary duct dilatation. Portal vein is patent on color Doppler imaging with normal direction of blood flow towards the liver. IMPRESSION: Fatty infiltration of the liver.  No acute findings. Electronically Signed   By: Rolm Baptise M.D.   On: 11/13/2017 20:10   Results for orders placed or performed during the hospital encounter of 11/14/17 (from the past 72 hour(s))  CBC     Status: None   Collection Time: 11/14/17  4:09 PM  Result Value Ref Range   WBC 6.8 4.0 - 10.5 K/uL   RBC 5.74 4.22 - 5.81 MIL/uL   Hemoglobin 15.9 13.0 - 17.0 g/dL   HCT 47.5 39.0 - 52.0 %   MCV 82.8 78.0 - 100.0 fL   MCH 27.7 26.0 - 34.0 pg   MCHC 33.5 30.0 - 36.0 g/dL   RDW 12.9 11.5 - 15.5 %   Platelets 271 150 - 400 K/uL    Comment: Performed at [facility] [address], [place], [place] [address]  Creatinine, serum     Status: None   Collection Time: 11/14/17  4:09 PM  Result Value Ref Range   Creatinine, Ser 1.14 0.61 - 1.24 mg/dL   GFR calc non Af Amer >60 >60 mL/min   GFR calc Af Amer >60 >60 mL/min    Comment: (NOTE) The eGFR has been calculated using the CKD EPI equation. This calculation has not been validated in all clinical situations. eGFR's persistently <60 mL/min signify possible  Chronic Kidney Disease. Performed at [facility], [place] [address] [place], [place] [address]      HEENT: normal Cardio: RRR and  murmur Resp: CTA B/L and unlabored GI: BS positive and NT, ND Extremity:  No Edema Skin:   Intact Neuro: Alert/Oriented, Cranial Nerve II-XII normal, Normal Sensory, Normal Motor, Abnormal FMC Ataxic/ dec FMC and Other Moderate ataxia Left FInger nose finger, mild ataxia L Heel Shin Musc/Skel:  Other No pain with upper limb or lower limb range of motion General no acute distress   Assessment/Plan: 1. Functional deficits secondary to left cerebellar infarct which require 3+ hours per day of interdisciplinary therapy in a comprehensive inpatient rehab setting. Physiatrist is providing close team supervision and 24 hour management of active medical problems listed below. Physiatrist and rehab team continue to assess barriers to discharge/monitor patient progress toward functional and medical goals. FIM: Function - Bathing Position: Shower Body parts bathed by patient: Right arm, Left arm, Chest, Abdomen, Front perineal area, Buttocks, Right upper leg, Left upper leg, Right lower leg, Left lower leg, Back Assist Level: Supervision or verbal cues  Function- Upper Body Dressing/Undressing What is the patient wearing?: Pull over shirt/dress Pull over shirt/dress - Perfomed by patient: Thread/unthread right sleeve, Thread/unthread left sleeve, Put head through opening, Pull shirt over trunk Assist Level: More than reasonable time Function - Lower Body Dressing/Undressing What  is the patient wearing?: Pants, Underwear, Socks Position: Sitting EOB Underwear - Performed by patient: Thread/unthread right underwear leg, Thread/unthread left underwear leg, Pull underwear up/down Pants- Performed by patient: Thread/unthread right pants leg, Thread/unthread left pants leg, Pull pants up/down Socks - Performed by patient: Don/doff right sock, Don/doff left  sock Assist for footwear: Supervision/touching assist Assist for lower body dressing: Touching or steadying assistance (Pt > 75%)  Function - Toileting Toileting steps completed by patient: Adjust clothing prior to toileting, Performs perineal hygiene, Adjust clothing after toileting Assist level: Touching or steadying assistance (Pt.75%)  Function - Air cabin crew transfer assistive device: Walker, Grab bar Assist level to toilet: Touching or steadying assistance (Pt > 75%) Assist level from toilet: Touching or steadying assistance (Pt > 75%)  Function - Chair/bed transfer Chair/bed transfer assist level: Touching or steadying assistance (Pt > 75%) Chair/bed transfer assistive device: Armrests, Walker Chair/bed transfer details: Verbal cues for technique, Verbal cues for precautions/safety, Verbal cues for safe use of DME/AE  Function - Locomotion: Ambulation Max distance: 10 ft Assist level: Touching or steadying assistance (Pt > 75%)  Function - Comprehension Comprehension: Auditory Comprehension assist level: Follows complex conversation/direction with extra time/assistive device  Function - Expression Expression: Verbal Expression assist level: Expresses complex ideas: With extra time/assistive device  Function - Social Interaction Social Interaction assist level: Interacts appropriately with others with medication or extra time (anti-anxiety, antidepressant).  Function - Problem Solving Problem solving assist level: Solves complex 90% of the time/cues < 10% of the time  Function - Memory Memory assist level: Complete Independence: No helper Patient normally able to recall (first 3 days only): Current season, Location of own room, Staff names and faces, That he or she is in a hospital  Medical Problem List and Plan:  1. Decreased functional ability with dizziness and ataxia secondary to left cerebellar infarction. Other vestibular symptoms including nausea are  improving.  -CIR evals today 2. DVT Prophylaxis/Anticoagulation: Subcutaneous Lovenox. Monitor for any bleeding episodes. Venous Doppler studies negative  3. Pain Management: Tylenol as needed  4. Mood: Provide emotional support  5. Neuropsych: This patient is capable of making decisions on his own behalf.  6. Skin/Wound Care: Routine skin checks  7. Fluids/Electrolytes/Nutrition: Routine in and outs with follow-up chemistries upon admit Recorded fluid intake 360 mL on 11/14/2017 8. Hyperlipidemia. Lipitor  9. Question alcohol use. Provide counseling, no signs of withdrawal thus far  LOS (Days) 1 A FACE TO FACE EVALUATION WAS PERFORMED  Charlett Blake 11/15/2017, 11:48 AM

## 2017-11-15 NOTE — Plan of Care (Signed)
  Problem: [facility] Description See Patient Education module for education specifics  11/15/2017 1029 by Dani Gobbleeardon, Anastasija Anfinson J, RN Outcome: Not Progressing 11/15/2017 1026 by Dani Gobbleeardon, Aquinnah Devin J, RN Outcome: Progressing   Problem: RH BOWEL ELIMINATION Goal: RH STG MANAGE BOWEL W/MEDICATION W/ASSISTANCE Description STG Manage Bowel with Medication with mod I Assistance.  11/15/2017 1029 by Dani Gobbleeardon, Aayla Marrocco J, RN Outcome: Not Progressing 11/15/2017 1026 by Dani Gobbleeardon, Marquesha Robideau J, RN Outcome: Not Progressing   Problem: RH SKIN INTEGRITY Goal: RH STG SKIN FREE OF INFECTION/BREAKDOWN Description Patients skin will remain free from infection or breakdown with mod I assist.  11/15/2017 1029 by Dani Gobbleeardon, Mikeal Winstanley J, RN Outcome: Not Progressing 11/15/2017 1026 by Dani Gobbleeardon, Jodel Mayhall J, RN Outcome: Progressing   Problem: RH SAFETY Goal: RH STG ADHERE TO SAFETY PRECAUTIONS W/ASSISTANCE/DEVICE Description STG Adhere to Safety Precautions With supervision Assistance/Device.  11/15/2017 1029 by Dani Gobbleeardon, Latrel Szymczak J, RN Outcome: Not Progressing 11/15/2017 1026 by Dani Gobbleeardon, Maghen Group J, RN Outcome: Progressing   Problem: RH KNOWLEDGE DEFICIT Goal: RH STG INCREASE KNOWLEDGE OF HYPERTENSION Description Min assist  11/15/2017 1029 by Dani Gobbleeardon, Norleen Xie J, RN Outcome: Not Progressing 11/15/2017 1026 by Dani Gobbleeardon, Jaleeyah Munce J, RN Outcome: Progressing Goal: RH STG INCREASE KNOWLEGDE OF HYPERLIPIDEMIA Description Min assist  11/15/2017 1029 by Dani Gobbleeardon, Raylynn Hersh J, RN Outcome: Not Progressing 11/15/2017 1026 by Dani Gobbleeardon, Avangeline Stockburger J, RN Outcome: Progressing Goal: RH STG INCREASE KNOWLEDGE OF STROKE PROPHYLAXIS Description Min assist  11/15/2017 1029 by Dani Gobbleeardon, Myrlene Riera J, RN Outcome: Not Progressing 11/15/2017 1026 by Dani Gobbleeardon, Gracey Tolle J, RN Outcome: Progressing

## 2017-11-15 NOTE — Evaluation (Signed)
Physical Therapy Assessment and Plan  Patient Details  Name: John Mcbride MRN: [id number] Date of Birth: 27-Sep-1965  PT Diagnosis: Abnormality of gait, Ataxia, Ataxic gait and Difficulty walking Rehab Potential: Good ELOS: 5 to 7 days   Today's Date: 11/15/2017 PT Individual Time: 8016-5537 PT Individual Time Calculation (min): 70 min    Problem List:  Patient Active Problem List   Diagnosis Date Noted  . Cerebellar ataxia ([place]) 11/14/2017  . Cerebellar infarct ([place])   . Essential hypertension   . Cerebellar stroke ([place]) 11/12/2017  . Nausea and vomiting 11/12/2017  . Dizziness and giddiness 11/12/2017  . Hyperglycemia 11/12/2017  . Leukocytosis 11/12/2017  . Alcohol use 11/12/2017    Past Medical History:  Past Medical History:  Diagnosis Date  . Heart murmur    Past Surgical History: History reviewed. No pertinent surgical history.  Assessment & Plan Clinical Impression: Patient is a 52 y.o. year old male with recent admission to the hospital on 11/12/17 with history of heart murmur, alcohol use on no prescription medications.  Patient lives with roommates.  Independent prior to admission.  Works Designer, fashion/clothing.  One level home.  He does have parents in the area that can check on him as needed as well as good support of roommates.  Presented 11/12/2017 with nausea vomiting as well as dizziness and incoordination.  Cranial CT scan showed suggestion of asymmetrical low attenuation and possible mass-effect in the left anterior cerebellum and cerebellar peduncle.  MRI showed 3.5 cm diameter acute infarction in the superior cerebellum on the left.  No evidence of mass-effect or hemorrhage.  CT angiogram of head and neck with no large vessel occlusion.  Patient did not receive TPA.  Echocardiogram with ejection fraction of 65% no wall motion abnormalities.  Neurology follow-up currently on aspirin for CVA prophylaxis.  Subcutaneous Lovenox for DVT prophylaxis.  Await plan  for possible TEE.  Therapy evaluations completed with recommendations of physical medicine rehab consult..  Patient transferred to CIR on 11/14/2017 .   Patient currently requires mod with mobility secondary to muscle weakness, impaired timing and sequencing and decreased coordination and decreased standing balance, decreased postural control and decreased balance strategies.  Prior to hospitalization, patient was independent  with mobility and lived with Friend(s) in a House home.  Home access is 3Stairs to enter.  Patient will benefit from skilled PT intervention to maximize safe functional mobility, minimize fall risk and decrease caregiver burden for planned discharge home with intermittent assist.  Anticipate patient will benefit from follow up OP at discharge.  PT - End of Session Activity Tolerance: Tolerates 30+ min activity with multiple rests Endurance Deficit: Yes PT Assessment Rehab Potential (ACUTE/IP ONLY): Good PT Patient demonstrates impairments in the following area(s): Balance;Safety;Endurance PT Transfers Functional Problem(s): Bed Mobility;Floor;Bed to Chair;Car;Furniture PT Locomotion Functional Problem(s): Ambulation;Stairs PT Plan PT Intensity: Minimum of 1-2 x/day ,45 to 90 minutes PT Duration Estimated Length of Stay: 5 to 7 days PT Treatment/Interventions: Ambulation/gait training;Balance/vestibular training;Community reintegration;Discharge planning;Disease management/prevention;DME/adaptive equipment instruction;Neuromuscular re-education;Patient/family education;Psychosocial support;Stair training;Therapeutic Activities;Therapeutic Exercise;UE/LE Strength taining/ROM;UE/LE Coordination activities PT Transfers Anticipated Outcome(s): (Supervision) PT Locomotion Anticipated Outcome(s): Supervision PT Recommendation Follow Up Recommendations: Outpatient PT Patient destination: Home Equipment Recommended: To be determined  Skilled Therapeutic  Intervention Intervention today focused on improving endurance and stability during functional activities.  Pt ambulated between 40 to 100 ft with SPC and min A working on symmetry of step length, blocked sit to stand practice without upper extremity support, single limb balance,  and worked on endurance by performing nu-step x 10 min on level 6.  Following session, pt returned to bed and with 3 rails up and call bell in place and bed alarm set.  Pt denied pain during session.  Vitals: 120/84.  PT Evaluation Precautions/Restrictions Precautions Precautions: Fall Restrictions Weight Bearing Restrictions: No General Chart Reviewed: Yes Response to Previous Treatment: Patient with no complaints from previous session. Family/Caregiver Present: No Vital SignsTherapy Vitals Temp: 97.7 F (36.5 C) Temp Source: Oral Pulse Rate: 87 Resp: 18 BP: 116/82 Patient Position (if appropriate): Lying Oxygen Therapy SpO2: 96 % O2 Device: Room Air    Home Living/Prior Functioning Home Living Available Help at Discharge: Family(Plans to go live with parents after d/c; parents available 24/7) Home Access: Stairs to enter [employer] of Steps: 3 Entrance Stairs-Rails: Left Home Layout: One level Bathroom Shower/Tub: Tub/shower unit;Tub only Additional Comments: (works on Mining engineer)  Lives With: Friend(s) Prior Function Level of Independence: Independent with basic ADLs;Independent with gait;Independent with transfers;Independent with homemaking with ambulation  Able to Take Stairs?: Yes Driving: Yes Vocation: Full time employment Leisure: Hobbies-yes (Comment) Comments: enjoys riding 4 wheelers and 80[employer] rock Vision/Perception  Vision - Assessment Ocular Range of Motion: Within Functional Limits Tracking/Visual Pursuits: (Overshoots with saccades but no nystagmus or intrusions with smooth pursuits) Perception Perception: Within Functional Limits Praxis Praxis: Intact   Cognition Overall Cognitive Status: Within Functional Limits for tasks assessed Arousal/Alertness: Awake/alert Orientation Level: Oriented X4 Attention: Alternating Alternating Attention: Appears intact Memory: Appears intact Awareness: Appears intact Problem Solving: Appears intact Safety/Judgment: Appears intact Comments: scores 6/6 on Mini-Cog Sensation  Intact in B UE/LE dermatomes   Motor  Motor Motor - Skilled Clinical Observations: (Decreased coordination especially with foot placement on stairs)      Trunk/Postural Assessment  Cervical Assessment Cervical Assessment: Within Functional Limits Thoracic Assessment Thoracic Assessment: Within Functional Limits Lumbar Assessment Lumbar Assessment: Within Functional Limits Postural Control Postural Control: Deficits on evaluation Righting Reactions: (Delayed APA's and APR's in standing, especially with L LE single limb stance)  Balance Balance Balance Assessed: Yes Static Sitting Balance Static Sitting - Balance Support: Feet supported Static Sitting - Level of Assistance: 5: Stand by assistance Static Standing Balance Static Standing - Balance Support: No upper extremity supported(R single limb balance: 3 sec; L single limb:   unable) Static Standing - Level of Assistance: 4: Min assist Dynamic Standing Balance Dynamic Standing - Balance Support: (See above for single limb balance) Extremity Assessment  RUE Assessment RUE Assessment: Within Functional Limits LUE Assessment LUE Assessment: Within Functional Limits RLE Assessment RLE Assessment: Within Functional Limits LLE Assessment LLE Assessment: Within Functional Limits General Strength Comments: L ankle DF and eversion: 4/5   See Function Navigator for Current Functional Status.   Refer to Care Plan for Long Term Goals  Recommendations for other services: Therapeutic Recreation  Kitchen group, Stress management and Outing/community  reintegration  Discharge Criteria: Patient will be discharged from PT if patient refuses treatment 3 consecutive times without medical reason, if treatment goals not met, if there is a change in medical status, if patient makes no progress towards goals or if patient is discharged from hospital.  The above assessment, treatment plan, treatment alternatives and goals were discussed and mutually agreed upon: by patient  John Mcbride Hilario Quarry 11/15/2017, 3:32 PM

## 2017-11-15 NOTE — Plan of Care (Deleted)
  Problem: Consults Goal: RH STROKE PATIENT EDUCATION Description See Patient Education module for education specifics  Outcome: Progressing   Problem: RH SKIN INTEGRITY Goal: RH STG SKIN FREE OF INFECTION/BREAKDOWN Description Patients skin will remain free from infection or breakdown with mod I assist.  Outcome: Progressing   Problem: RH SAFETY Goal: RH STG ADHERE TO SAFETY PRECAUTIONS W/ASSISTANCE/DEVICE Description STG Adhere to Safety Precautions With supervision Assistance/Device.  Outcome: Progressing   Problem: RH KNOWLEDGE DEFICIT Goal: RH STG INCREASE KNOWLEDGE OF HYPERTENSION Description Min assist  Outcome: Progressing Goal: RH STG INCREASE KNOWLEGDE OF HYPERLIPIDEMIA Description Min assist  Outcome: Progressing Goal: RH STG INCREASE KNOWLEDGE OF STROKE PROPHYLAXIS Description Min assist  Outcome: Progressing   Problem: RH BOWEL ELIMINATION Goal: RH STG MANAGE BOWEL W/MEDICATION W/ASSISTANCE Description STG Manage Bowel with Medication with mod I Assistance.  Outcome: Not Progressing    No BM since 8/12- assisting with laxatives and potentially suppository if no BM later today

## 2017-11-15 NOTE — Progress Notes (Signed)
Physical Therapy Session Note  Patient Details  Name: John Mcbride MRN: [id number] Date of Birth: November 18, 1965  Today's Date: 11/15/2017 PT Individual Time: 0254-2706 PT Individual Time Calculation (min): 54 min   Short Term Goals: Week 1:  PT Short Term Goal 1 (Week 1): STG equal LTG due to ELOS  Skilled Therapeutic Interventions/Progress Updates:   Pt received in care of NT while toileting, agreeable to therapy and denies pain. Finished w/ washing hands w/ supervision and ambulated to/from therapy gym w/ min guard to close supervision using RW, >150' w/ increased time, moves at a very slow gait speed out of fear of falling. Performed standing balance tasks in gym including Berg Balance Scale. Scored 31/56 and explained significance of results to pt including increased fall risk and AD use for safety. Returned to room and ended session in recliner, call bell within reach and all needs met.   Therapy Documentation Precautions:  Precautions Precautions: Fall Restrictions Weight Bearing Restrictions: No Balance: Balance Balance Assessed: Yes Standardized Balance Assessment Standardized Balance Assessment: Berg Balance Test Berg Balance Test Sit to Stand: Able to stand without using hands and stabilize independently Standing Unsupported: Able to stand 2 minutes with supervision Sitting with Back Unsupported but Feet Supported on Floor or Stool: Able to sit safely and securely 2 minutes Stand to Sit: Sits safely with minimal use of hands Transfers: Able to transfer with verbal cueing and /or supervision Standing Unsupported with Eyes Closed: Able to stand 10 seconds with supervision Standing Ubsupported with Feet Together: Able to place feet together independently but unable to hold for 30 seconds From Standing, Reach Forward with Outstretched Arm: Can reach forward >12 cm safely (5") From Standing Position, Pick up Object from Floor: Able to pick up shoe, needs supervision From  Standing Position, Turn to Look Behind Over each Shoulder: Needs supervision when turning Turn 360 Degrees: Needs close supervision or verbal cueing Standing Unsupported, Alternately Place Feet on Step/Stool: Able to complete >2 steps/needs minimal assist Standing Unsupported, One Foot in Front: Loses balance while stepping or standing Standing on One Leg: Unable to try or needs assist to prevent fall Total Score: 31 Static Sitting Balance Static Sitting - Balance Support: Feet supported Static Sitting - Level of Assistance: 5: Stand by assistance Static Standing Balance Static Standing - Balance Support: No upper extremity supported(R single limb balance: 3 sec; L single limb:   unable) Static Standing - Level of Assistance: 4: Min assist Dynamic Standing Balance Dynamic Standing - Balance Support: (See above for single limb balance)  See Function Navigator for Current Functional Status.   Therapy/Group: Individual Therapy  Samual Beals K Arnette 11/15/2017, 4:51 PM

## 2017-11-16 ENCOUNTER — Encounter (HOSPITAL_COMMUNITY): Payer: Self-pay | Admitting: Internal Medicine

## 2017-11-16 ENCOUNTER — Inpatient Hospital Stay (HOSPITAL_COMMUNITY): Payer: BLUE CROSS/BLUE SHIELD

## 2017-11-16 NOTE — Progress Notes (Signed)
Physical Therapy Session Note  Patient Details  Name: John Mcbride MRN: [id number] Date of Birth: 1966/01/19  Today's Date: 11/16/2017 PT Individual Time: 1000-1058 PT Individual Time Calculation (min): 58 min   Short Term Goals: Week 1:  PT Short Term Goal 1 (Week 1): STG equal LTG due to ELOS  Skilled Therapeutic Interventions/Progress Updates:    Pt seated in recliner upon PT arrival, agreeable to therapy tx and denies pain. Pt ambulated from room>gym with RW and CGA x 200 ft. Pt worked on dynamic standing balance this session without UE support inlcuding: toe taps on 4 inch step x 2 trials, standing on airex while tossing horseshoes, standing on airex feet together, standing on airex feet together/eyes closed, sidestepping 4 x 10 ft in each directions, forwards/backwards ambulation without AD 4 x 15 ft. Pt ambulated to the steps with RW and CGA, pt ascended/descended 12 steps with single handrail and min assist, reciprocal pattern when ascended and step to pattern when descending. Pt ambulated back to mat. Pt transferred to quadruped on mat, worked on neuro re-ed in this position and core/extremity strengthening to perform x 10 alternating hip extensions each leg and x 10 alternating shoulder flexion. Pt transferred to tall kneeling with min assist performed x 10 chop/reverse chops working on balance and core strength. Pt ambulated back to room with CGA and left seated in recliner with needs in reach.   Therapy Documentation Precautions:  Precautions Precautions: Fall Restrictions Weight Bearing Restrictions: No   See Function Navigator for Current Functional Status.   Therapy/Group: Individual Therapy  Cresenciano GenreEmily van Schagen, PT, DPT 11/16/2017, 7:49 AM

## 2017-11-16 NOTE — Progress Notes (Signed)
Subjective/Complaints: Patient slept okay last night, no bowel or bladder complaints, good appetite  ROS - Denies CP,SOB.N/V/D  Objective: Vital Signs: Blood pressure 124/86, pulse 72, temperature 98 F (36.7 C), temperature source Oral, resp. rate 16, height 5' 10" (1.778 m), weight 98 kg, SpO2 96 %. No results found. Results for orders placed or performed during the hospital encounter of 11/14/17 (from the past 72 hour(s))  CBC     Status: None   Collection Time: 11/14/17  4:09 PM  Result Value Ref Range   WBC 6.8 4.0 - 10.5 K/uL   RBC 5.74 4.22 - 5.81 MIL/uL   Hemoglobin 15.9 13.0 - 17.0 g/dL   HCT 47.5 39.0 - 52.0 %   MCV 82.8 78.0 - 100.0 fL   MCH 27.7 26.0 - 34.0 pg   MCHC 33.5 30.0 - 36.0 g/dL   RDW 12.9 11.5 - 15.5 %   Platelets 271 150 - 400 K/uL    Comment: Performed at [facility] [address], [place], [place] [address]  Creatinine, serum     Status: None   Collection Time: 11/14/17  4:09 PM  Result Value Ref Range   Creatinine, Ser 1.14 0.61 - 1.24 mg/dL   GFR calc non Af Amer >60 >60 mL/min   GFR calc Af Amer >60 >60 mL/min    Comment: (NOTE) The eGFR has been calculated using the CKD EPI equation. This calculation has not been validated in all clinical situations. eGFR's persistently <60 mL/min signify possible Chronic Kidney Disease. Performed at [facility], [place] [address], [place], [place] [address]      HEENT: normal Cardio: RRR and  murmur Resp: CTA B/L and unlabored GI: BS positive and NT, ND Extremity:  No Edema Skin:   Intact Neuro: Alert/Oriented, Cranial Nerve II-XII normal, Normal Sensory, Normal Motor, Abnormal FMC Ataxic/ dec FMC and Other Moderate ataxia Left FInger nose finger, mild ataxia L Heel Shin Musc/Skel:  Other No pain with upper limb or lower limb range of motion General no acute distress   Assessment/Plan: 1. Functional deficits secondary to left cerebellar infarct which require 3+ hours per day of  interdisciplinary therapy in a comprehensive inpatient rehab setting. Physiatrist is providing close team supervision and 24 hour management of active medical problems listed below. Physiatrist and rehab team continue to assess barriers to discharge/monitor patient progress toward functional and medical goals. FIM: Function - Bathing Position: Shower Body parts bathed by patient: Right arm, Left arm, Chest, Abdomen, Front perineal area, Buttocks, Right upper leg, Left upper leg, Right lower leg, Left lower leg, Back Assist Level: Supervision or verbal cues  Function- Upper Body Dressing/Undressing What is the patient wearing?: Pull over shirt/dress Pull over shirt/dress - Perfomed by patient: Thread/unthread right sleeve, Thread/unthread left sleeve, Put head through opening, Pull shirt over trunk Assist Level: More than reasonable time Function - Lower Body Dressing/Undressing What is the patient wearing?: Pants, Underwear, Socks Position: Sitting EOB Underwear - Performed by patient: Thread/unthread right underwear leg, Thread/unthread left underwear leg, Pull underwear up/down Pants- Performed by patient: Thread/unthread right pants leg, Thread/unthread left pants leg, Pull pants up/down Socks - Performed by patient: Don/doff right sock, Don/doff left sock Assist for footwear: Supervision/touching assist Assist for lower body dressing: Touching or steadying assistance (Pt > 75%)  Function - Toileting Toileting steps completed by patient: Adjust clothing prior to toileting, Performs perineal hygiene, Adjust clothing after toileting Assist level: Touching or steadying assistance (Pt.75%)  Function - Toilet  Transfers Toilet transfer assistive device: Walker Assist level to toilet: Touching or steadying assistance (Pt > 75%) Assist level from toilet: Touching or steadying assistance (Pt > 75%)  Function - Chair/bed transfer Chair/bed transfer method: Ambulatory Chair/bed transfer  assist level: Touching or steadying assistance (Pt > 75%) Chair/bed transfer assistive device: Armrests, Walker Chair/bed transfer details: Manual facilitation for weight shifting, Verbal cues for technique, Verbal cues for sequencing  Function - Locomotion: Wheelchair Will patient use wheelchair at discharge?: No Type: Manual Max wheelchair distance: (150 ft) Assist Level: Supervision or verbal cues Assist Level: Supervision or verbal cues Assist Level: Supervision or verbal cues Turns around,maneuvers to table,bed, and toilet,negotiates 3% grade,maneuvers on rugs and over doorsills: No Function - Locomotion: Ambulation Assistive device: Walker-rolling Max distance: 200 ft Assist level: Touching or steadying assistance (Pt > 75%) Assist level: Touching or steadying assistance (Pt > 75%) Walk 50 feet with 2 turns activity did not occur: Safety/medical concerns(Limited stability) Assist level: Touching or steadying assistance (Pt > 75%) Walk 150 feet activity did not occur: Safety/medical concerns(Endurance) Assist level: Touching or steadying assistance (Pt > 75%) Walk 10 feet on uneven surfaces activity did not occur: Safety/medical concerns(Decreased balance/ataxia)  Function - Comprehension Comprehension: Auditory Comprehension assist level: Follows complex conversation/direction with extra time/assistive device  Function - Expression Expression: Verbal Expression assist level: Expresses complex ideas: With extra time/assistive device  Function - Social Interaction Social Interaction assist level: Interacts appropriately with others with medication or extra time (anti-anxiety, antidepressant).  Function - Problem Solving Problem solving assist level: Solves complex 90% of the time/cues < 10% of the time  Function - Memory Memory assist level: Complete Independence: No helper Patient normally able to recall (first 3 days only): Current season, Location of own room, Staff  names and faces, That he or she is in a hospital  Medical Problem List and Plan:  1. Decreased functional ability with dizziness and ataxia secondary to left cerebellar infarction. Other vestibular symptoms including nausea are improving.  -CIR evals today, PT, OT, speech 2. DVT Prophylaxis/Anticoagulation: Subcutaneous Lovenox. Monitor for any bleeding episodes. Venous Doppler studies negative , Plavix and aspirin for stroke prophylaxis 3. Pain Management: Tylenol as needed  4. Mood: Provide emotional support  5. Neuropsych: This patient is capable of making decisions on his own behalf.  6. Skin/Wound Care: Routine skin checks  7. Fluids/Electrolytes/Nutrition: Routine in and outs with follow-up chemistries upon admit Recorded fluid intake 1200 mL on 11/16/2017 8. Hyperlipidemia. Lipitor  9. Question alcohol use. Provide counseling, no signs of withdrawal thus far  LOS (Days) 2 A FACE TO FACE EVALUATION WAS PERFORMED  Charlett Blake 11/16/2017, 11:08 AM

## 2017-11-17 ENCOUNTER — Inpatient Hospital Stay (HOSPITAL_COMMUNITY): Payer: BLUE CROSS/BLUE SHIELD

## 2017-11-17 LAB — CBC WITH DIFFERENTIAL/PLATELET
ABS IMMATURE GRANULOCYTES: 0 10*3/uL (ref 0.0–0.1)
BASOS ABS: 0.1 10*3/uL (ref 0.0–0.1)
Basophils Relative: 1 %
Eosinophils Absolute: 0.3 10*3/uL (ref 0.0–0.7)
Eosinophils Relative: 4 %
HCT: 48.9 % (ref 39.0–52.0)
Hemoglobin: 16.4 g/dL (ref 13.0–17.0)
IMMATURE GRANULOCYTES: 1 %
LYMPHS PCT: 27 %
Lymphs Abs: 1.8 10*3/uL (ref 0.7–4.0)
MCH: 27.8 pg (ref 26.0–34.0)
MCHC: 33.5 g/dL (ref 30.0–36.0)
MCV: 83 fL (ref 78.0–100.0)
Monocytes Absolute: 0.7 10*3/uL (ref 0.1–1.0)
Monocytes Relative: 11 %
NEUTROS ABS: 3.6 10*3/uL (ref 1.7–7.7)
NEUTROS PCT: 56 %
Platelets: 256 10*3/uL (ref 150–400)
RBC: 5.89 MIL/uL — AB (ref 4.22–5.81)
RDW: 12.9 % (ref 11.5–15.5)
WBC: 6.4 10*3/uL (ref 4.0–10.5)

## 2017-11-17 LAB — CARDIOLIPIN ANTIBODIES, IGG, IGM, IGA: Anticardiolipin IgA: <9 APL U/mL (ref 0–11)

## 2017-11-17 LAB — COMPREHENSIVE METABOLIC PANEL
ALBUMIN: 4.2 g/dL (ref 3.5–5.0)
ALK PHOS: 44 U/L (ref 38–126)
ALT: 40 U/L (ref 0–44)
ANION GAP: 7 (ref 5–15)
AST: 28 U/L (ref 15–41)
BUN: 11 mg/dL (ref 6–20)
CALCIUM: 9.3 mg/dL (ref 8.9–10.3)
CO2: 30 mmol/L (ref 22–32)
Chloride: 104 mmol/L (ref 98–111)
Creatinine, Ser: 1.21 mg/dL (ref 0.61–1.24)
GFR calc Af Amer: >60 mL/min (ref >60)
GFR calc non Af Amer: >60 mL/min (ref >60)
GLUCOSE: 108 mg/dL — AB (ref 70–99)
Potassium: 4.2 mmol/L (ref 3.5–5.1)
SODIUM: 141 mmol/L (ref 135–145)
Total Bilirubin: 3 mg/dL — ABNORMAL HIGH (ref 0.3–1.2)
Total Protein: 6.8 g/dL (ref 6.5–8.1)

## 2017-11-17 NOTE — Progress Notes (Signed)
Social Work  Social Work Assessment and Plan  Patient Details  Name: Elvina SidleDavid G Whitmire MRN: [id number] Date of Birth: [birth year]  Today's Date: 11/17/2017  Problem List:  Patient Active Problem List   Diagnosis Date Noted  . Cerebellar ataxia (HCC) 11/14/2017  . Cerebellar infarct (HCC)   . Essential hypertension   . Cerebellar stroke (HCC) 11/12/2017  . Nausea and vomiting 11/12/2017  . Dizziness and giddiness 11/12/2017  . Hyperglycemia 11/12/2017  . Leukocytosis 11/12/2017  . Alcohol use 11/12/2017   Past Medical History:  Past Medical History:  Diagnosis Date  . Heart murmur    Past Surgical History:  Past Surgical History:  Procedure Laterality Date  . TEE WITHOUT CARDIOVERSION N/A 11/14/2017   Procedure: TRANSESOPHAGEAL ECHOCARDIOGRAM (TEE);  Surgeon: Pricilla Riffleoss, Paula V, MD;  [facility];  Service: Cardiovascular;  Laterality: N/A;  bubble study   Social History:  reports that he has never smoked. He has never used smokeless tobacco. He reports that he drinks about 10.0 standard drinks of alcohol per week. He reports that he does not use drugs.  Family / Support Systems Marital Status: Single Patient Roles: Other (Comment)(employee) Other Supports: John-roommate [phone]-cell Phyllis-Mom [phone] Anticipated Caregiver: Parents and John along with another friend Ability/Limitations of Caregiver: Parents are limited due to their age, [name] works during the day Caregiver Availability: Intermittent Family Dynamics: Close with parents, [name] and his other friends he feels between all of them he will have the care he needs. He is still processing all of this and the fact he had a stroke. He wants to be as independent as possible before leaving here.  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 Read: Yes Write: Yes Employment Status: Employed Return to Work Plans: Would  need to recover to be able to return to work and will need to Arts administratorfinish rehab Legal Hisotry/Current Legal Issues: No issues Guardian/Conservator: None-according to MD pt is capable of making his own decisions while here   Abuse/Neglect Abuse/Neglect Assessment Can Be Completed: Yes Physical Abuse: Denies Verbal Abuse: Denies Sexual Abuse: Denies Exploitation of patient/patient's resources: Denies Self-Neglect: Denies  Emotional Status Pt's affect, behavior adn adjustment status: Pt is motivated to do well and recover from this. He has made good progress already and is hopeful this will continue while here. He is still processing all that has happened. He talked with the MD this am and feels he has a better understanding of his stroke. Recent Psychosocial Issues: not been to a MD in years thought he was healthy Pyschiatric History: No history deferred depression screen due to adjusting to the new unit. He would benefit from seeing neuro-psych while here. Will make referral while here Substance Abuse History: Was limiting his ETOH feels he doesn't not have a problem with it. MD watching for any signs for withdrawal  Patient / Family Perceptions, Expectations & Goals Pt/Family understanding of illness & functional limitations: Pt is able to explain his stroke and deficits. He did talk with the MD regarding his stroke, prognosis and medications. He feels he is doing well and seeing progress in his therapies. Premorbid pt/family roles/activities: Son, employee, friend, etc Anticipated changes in roles/activities/participation: resume Pt/family expectations/goals: Pt states: " I need to be independent before I leave here, I plan to be."  Mom states: " I hope he does well we can do some for him."  [employer] Resources Community Agencies: None Premorbid Home Care/DME Agencies: None Transportation available at discharge:  Parents & friends Resource referrals recommended: Neuropsychology, Support group  (specify)  Discharge Planning Living Arrangements: Non-relatives/Friends Support Systems: Parent, Friends/neighbors Type of Residence: Private residence Insurance Resources: Media plannerrivate Insurance (specify)(BCBS) Financial Resources: Employment Financial Screen Referred: No Living Expenses: Psychologist, sport and exerciseent Money Management: Patient Does the patient have any problems obtaining your medications?: No(wasn't going to a MD prior to admission) Home Management: Self Patient/Family Preliminary Plans: Plans to go to parent's for a few days then [place] his friends who just installed a pool so he can continue his rehab. His roommate-John works during the day and can not be there. Pt plans to see his progress and decide once ready for DC, Sw Barriers to Discharge: Decreased caregiver support Sw Barriers to Discharge Comments: does not have 24 hr care Social Work Anticipated Follow Up Needs: HH/OP, Support Group  Clinical Impression Pleasant gentleman who is doing well and making good progress since stroke. He is still trying to understand his stroke and prognosis. He plans to go to parents home or friends so someone is there when he first goes home. Will work with on discharge needs. Have made referral for neuro-psych to see while here.  Lucy Chrisupree, Angelina Neece G 11/17/2017, 1:41 PM

## 2017-11-17 NOTE — Progress Notes (Signed)
Occupational Therapy Session Note  Patient Details  Name: John Mcbride MRN: [id number] Date of Birth: [birth year]  Today's Date: 11/17/2017 OT Individual Time: 1330-1430 OT Individual Time Calculation (min): 60 min    Short Term Goals: Week 1:  OT Short Term Goal 1 (Week 1): N/A d/t ELOS  Skilled Therapeutic Interventions/Progress Updates:    Pt resting in recliner upon arrival and requested to use toilet before leaving room.  Pt amb with RW bathroom with supervision and stood at toilet to void (CGA). Pt amb with RW to day room and engaged in table tasks with focus on increased LUE functional use.  9 hole peg test: R-30.49 secs, L-59.94 secs. Pt engaged in reaching for clothes pins and placing on metal dowels.  Pt retrieved small beads from table, placed in theraputty, removed from theraputty, and replaced in theraputty before placing theraputty in container.  Pt completed all tasks with LUE.  Pt also practiced stacking 5 tokens and removing.  Pt also practiced in-hand manipulation tasks with tokens.  Pt instructed to continue practicing learned tasks. Tokens and theraputty left in pt's room.  Pt remained in recliner with chair alarm activated.   Therapy Documentation Precautions:  Precautions Precautions: Fall Restrictions Weight Bearing Restrictions: No Pain:  Pt denies pain  See Function Navigator for Current Functional Status.   Therapy/Group: Individual Therapy  John Mcbride, John Mcbride 11/17/2017, 2:45 PM

## 2017-11-17 NOTE — Progress Notes (Signed)
Subjective/Complaints:  No issues overnight , discussed stroke etiology , risk factors and prognosis  ROS - Denies CP,SOB.N/V/D  Objective: Vital Signs: Blood pressure 111/85, pulse 67, temperature 98.1 F (36.7 C), temperature source Oral, resp. rate 17, height 5' 10"  (1.778 m), weight 96.6 kg, SpO2 100 %. No results found. Results for orders placed or performed during the hospital encounter of 11/14/17 (from the past 72 hour(s))  CBC     Status: None   Collection Time: 11/14/17  4:09 PM  Result Value Ref Range   WBC 6.8 4.0 - 10.5 K/uL   RBC 5.74 4.22 - 5.81 MIL/uL   Hemoglobin 15.9 13.0 - 17.0 g/dL   HCT 47.5 39.0 - 52.0 %   MCV 82.8 78.0 - 100.0 fL   MCH 27.7 26.0 - 34.0 pg   MCHC 33.5 30.0 - 36.0 g/dL   RDW 12.9 11.5 - 15.5 %   Platelets 271 150 - 400 K/uL    Comment: Performed at [facility] [address] [place], [place] [address]  Creatinine, serum     Status: None   Collection Time: 11/14/17  4:09 PM  Result Value Ref Range   Creatinine, Ser 1.14 0.61 - 1.24 mg/dL   GFR calc non Af Amer >60 >60 mL/min   GFR calc Af Amer >60 >60 mL/min    Comment: (NOTE) The eGFR has been calculated using the CKD EPI equation. This calculation has not been validated in all clinical situations. eGFR's persistently <60 mL/min signify possible Chronic Kidney Disease. Performed at [facility], [place] [address], [place], [place] [address]   CBC WITH DIFFERENTIAL     Status: Abnormal   Collection Time: 11/17/17  6:49 AM  Result Value Ref Range   WBC 6.4 4.0 - 10.5 K/uL   RBC 5.89 (H) 4.22 - 5.81 MIL/uL   Hemoglobin 16.4 13.0 - 17.0 g/dL   HCT 48.9 39.0 - 52.0 %   MCV 83.0 78.0 - 100.0 fL   MCH 27.8 26.0 - 34.0 pg   MCHC 33.5 30.0 - 36.0 g/dL   RDW 12.9 11.5 - 15.5 %   Platelets 256 150 - 400 K/uL   Neutrophils Relative % 56 %   Neutro Abs 3.6 1.7 - 7.7 K/uL   Lymphocytes Relative 27 %   Lymphs Abs 1.8 0.7 - 4.0 K/uL   Monocytes Relative 11 %   Monocytes  Absolute 0.7 0.1 - 1.0 K/uL   Eosinophils Relative 4 %   Eosinophils Absolute 0.3 0.0 - 0.7 K/uL   Basophils Relative 1 %   Basophils Absolute 0.1 0.0 - 0.1 K/uL   Immature Granulocytes 1 %   Abs Immature Granulocytes 0.0 0.0 - 0.1 K/uL    Comment: Performed at [facility] [address] [place],  [address]     HEENT: normal Cardio: RRR and  murmur Resp: CTA B/L and unlabored GI: BS positive and NT, ND Extremity:  No Edema Skin:   Intact Neuro: Alert/Oriented, Cranial Nerve II-XII normal, Normal Sensory, Normal Motor, Abnormal FMC Ataxic/ dec FMC and Other Moderate ataxia Left FInger nose finger, mild ataxia L Heel Shin Musc/Skel:  Other No pain with upper limb or lower limb range of motion General no acute distress   Assessment/Plan: 1. Functional deficits secondary to left cerebellar infarct which require 3+ hours per day of interdisciplinary therapy in a comprehensive inpatient rehab setting. Physiatrist is providing close team supervision and 24 hour management of active medical problems  listed below. Physiatrist and rehab team continue to assess barriers to discharge/monitor patient progress toward functional and medical goals. FIM: Function - Bathing Position: Shower Body parts bathed by patient: Right arm, Left arm, Chest, Abdomen, Front perineal area, Buttocks, Right upper leg, Left upper leg, Right lower leg, Left lower leg, Back Assist Level: Supervision or verbal cues  Function- Upper Body Dressing/Undressing What is the patient wearing?: Pull over shirt/dress Pull over shirt/dress - Perfomed by patient: Thread/unthread right sleeve, Thread/unthread left sleeve, Put head through opening, Pull shirt over trunk Assist Level: More than reasonable time Function - Lower Body Dressing/Undressing What is the patient wearing?: Pants, Underwear, Socks Position: Sitting EOB Underwear - Performed by patient: Thread/unthread right underwear leg, Thread/unthread  left underwear leg, Pull underwear up/down Pants- Performed by patient: Thread/unthread right pants leg, Thread/unthread left pants leg, Pull pants up/down Socks - Performed by patient: Don/doff right sock, Don/doff left sock Assist for footwear: Supervision/touching assist Assist for lower body dressing: Touching or steadying assistance (Pt > 75%)  Function - Toileting Toileting steps completed by patient: Adjust clothing prior to toileting, Performs perineal hygiene, Adjust clothing after toileting Assist level: More than reasonable time  Function - Air cabin crew transfer assistive device: Walker Assist level to toilet: Touching or steadying assistance (Pt > 75%) Assist level from toilet: Touching or steadying assistance (Pt > 75%)  Function - Chair/bed transfer Chair/bed transfer method: Ambulatory Chair/bed transfer assist level: Touching or steadying assistance (Pt > 75%) Chair/bed transfer assistive device: Armrests, Walker Chair/bed transfer details: Manual facilitation for weight shifting, Verbal cues for technique, Verbal cues for sequencing  Function - Locomotion: Wheelchair Will patient use wheelchair at discharge?: No Type: Manual Max wheelchair distance: (150 ft) Assist Level: Supervision or verbal cues Assist Level: Supervision or verbal cues Assist Level: Supervision or verbal cues Turns around,maneuvers to table,bed, and toilet,negotiates 3% grade,maneuvers on rugs and over doorsills: No Function - Locomotion: Ambulation Assistive device: Walker-rolling Max distance: 200 ft Assist level: Touching or steadying assistance (Pt > 75%) Assist level: Touching or steadying assistance (Pt > 75%) Walk 50 feet with 2 turns activity did not occur: Safety/medical concerns(Limited stability) Assist level: Touching or steadying assistance (Pt > 75%) Walk 150 feet activity did not occur: Safety/medical concerns(Endurance) Assist level: Touching or steadying assistance  (Pt > 75%) Walk 10 feet on uneven surfaces activity did not occur: Safety/medical concerns(Decreased balance/ataxia)  Function - Comprehension Comprehension: Auditory Comprehension assist level: Follows complex conversation/direction with extra time/assistive device  Function - Expression Expression: Verbal Expression assist level: Expresses complex ideas: With extra time/assistive device  Function - Social Interaction Social Interaction assist level: Interacts appropriately with others with medication or extra time (anti-anxiety, antidepressant).  Function - Problem Solving Problem solving assist level: Solves complex 90% of the time/cues < 10% of the time  Function - Memory Memory assist level: Complete Independence: No helper Patient normally able to recall (first 3 days only): Current season, Location of own room, Staff names and faces, That he or she is in a hospital  Medical Problem List and Plan:  1. Decreased functional ability with dizziness and ataxia secondary to left cerebellar infarction. Other vestibular symptoms including nausea are improving.  -CIR  PT, OT, speech 2. DVT Prophylaxis/Anticoagulation: Subcutaneous Lovenox. Monitor for any bleeding episodes. Venous Doppler studies negative , Plavix and aspirin for stroke prophylaxis 3. Pain Management: Tylenol as needed  4. Mood: Provide emotional support  5. Neuropsych: This patient is capable of making decisions on his own behalf.  6. Skin/Wound Care: Routine skin checks  7. Fluids/Electrolytes/Nutrition: Routine in and outs with follow-up chemistries upon admit Recorded fluid intake 700 mL on 11/16/2017 8. Hyperlipidemia.LDL mildy elevated cont  Lipitor  9. Question alcohol use. Provide counseling, no signs of withdrawal thus far Labs reviewed BMET and CBC normal LOS (Days) 3 A FACE TO FACE EVALUATION WAS PERFORMED  Charlett Blake 11/17/2017, 7:49 AM

## 2017-11-17 NOTE — Plan of Care (Signed)
  Problem: Consults Goal: RH STROKE PATIENT EDUCATION Description See Patient Education module for education specifics  Outcome: Progressing   Problem: RH SKIN INTEGRITY Goal: RH STG SKIN FREE OF INFECTION/BREAKDOWN Description Patients skin will remain free from infection or breakdown with mod I assist.  Outcome: Progressing   Problem: RH SAFETY Goal: RH STG ADHERE TO SAFETY PRECAUTIONS W/ASSISTANCE/DEVICE Description STG Adhere to Safety Precautions With supervision Assistance/Device.  Outcome: Progressing   Problem: RH KNOWLEDGE DEFICIT Goal: RH STG INCREASE KNOWLEDGE OF HYPERTENSION Description Min assist  Outcome: Progressing Goal: RH STG INCREASE KNOWLEGDE OF HYPERLIPIDEMIA Description Min assist  Outcome: Progressing Goal: RH STG INCREASE KNOWLEDGE OF STROKE PROPHYLAXIS Description Min assist  Outcome: Progressing   Problem: RH BOWEL ELIMINATION Goal: RH STG MANAGE BOWEL W/MEDICATION W/ASSISTANCE Description STG Manage Bowel with Medication with mod I Assistance.  Outcome: Not Progressing    Patient hesitant to take laxatives.  Senna ordered QHS prn.  Attempted using coffee as this normally helps at home.  Will continue to monitor.

## 2017-11-17 NOTE — Progress Notes (Signed)
Physical Therapy Session Note  Patient Details  Name: John Mcbride MRN: [id number] Date of Birth: 08-01-1965  Today's Date: 11/17/2017 PT Individual Time: 0915-1025 PT Individual Time Calculation (min): 70 min   Short Term Goals: Week 1:  PT Short Term Goal 1 (Week 1): STG equal LTG due to ELOS  Skilled Therapeutic Interventions/Progress Updates:    Pt seated EOB upon PT arrival, agreeable to therapy tx and denies pain. Pt donned shoes with supervision. Pt ambulated from room>gym with supervision and RW, verbal cues for increased step length and increased gait speed. Pt worked on dynamic standing balance this session without UE support including: lateral steps over taped target in both directions x 2 trials, toe taps on colored cones x 2 trials, ambulation without AD 3 x 85 ft, ambulating in hallways while picking up cups off the ground, sit<>stands x 15 without UE support. Pt ambulated to the dayroom x 150 ft without AD, min assist. Pt used nustep x 5 min on workload 5 for reciprocal movement and speed. [facility] and supervision. Pt used dynavision while standing on airex x 2 trials working on dynamic balance and L UE coordination, decreased reaction time and accuracy on L. Pt ambulated back to room and left seated in recliner with needs in reach and alarm set.   Therapy Documentation Precautions:  Precautions Precautions: Fall Restrictions Weight Bearing Restrictions: No   See Function Navigator for Current Functional Status.   Therapy/Group: Individual Therapy  Cresenciano GenreEmily van Schagen, PT, DPT 11/17/2017, 9:34 AM

## 2017-11-17 NOTE — IPOC Note (Signed)
Overall Plan of Care [facility](IPOC) Patient Details Name: John SidleDavid G Mcbride MRN: [id number] DOB: 14-Dec-1965  Admitting Diagnosis: <principal problem not specified>  Hospital Problems: Active Problems:   Cerebellar ataxia (HCC)   Cerebellar infarct [facility](HCC)   Essential hypertension     Functional Problem List: Nursing Medication Management, Perception, Safety, Endurance, Motor  PT Balance, Safety, Endurance  OT Balance, Safety, Motor  SLP    TR         Basic ADL's: OT Bathing, Dressing, Toileting     Advanced  ADL's: OT Simple Meal Preparation     Transfers: PT Bed Mobility, Floor, Bed to Chair, Car, Occupational psychologisturniture  OT Toilet, Research scientist (life sciences)Tub/Shower     Locomotion: PT Ambulation, Stairs     Additional Impairments: OT None  SLP        TR      Anticipated Outcomes Item Anticipated Outcome  Self Feeding Independent  Swallowing      Basic self-care  mod I  Toileting  mod I   Bathroom Transfers mod I  Bowel/Bladder  Mod I  Transfers  (Supervision)  Locomotion  Supervision  Communication     Cognition     Pain  < 3  Safety/Judgment  Mod I    Therapy Plan: PT Intensity: Minimum of 1-2 x/day ,45 to 90 minutes PT Duration Estimated Length of Stay: 5 to 7 days OT Intensity: Minimum of 1-2 x/day, 45 to 90 minutes OT Frequency: 5 out of 7 days OT Duration/Estimated Length of Stay: 5-7 days      Team Interventions: Nursing Interventions Patient/Family Education, Medication Management, Disease Management/Prevention, Discharge Planning  PT interventions Ambulation/gait training, Warden/rangerBalance/vestibular training, Community reintegration, Discharge planning, Disease management/prevention, Fish farm managerDME/adaptive equipment instruction, Neuromuscular re-education, Patient/family education, Psychosocial support, Stair training, Therapeutic Activities, Therapeutic Exercise, UE/LE Strength taining/ROM, UE/LE Coordination activities  OT Interventions Warden/rangerBalance/vestibular training, Self Care/advanced ADL  retraining, Therapeutic Exercise, DME/adaptive equipment instruction, Community reintegration, Equities traderatient/family education, UE/LE Coordination activities, Discharge planning, Functional mobility training, Therapeutic Activities, Psychosocial support, Visual/perceptual remediation/compensation, Disease mangement/prevention, UE/LE Strength taining/ROM  SLP Interventions    TR Interventions    SW/CM Interventions Discharge Planning, Psychosocial Support, Patient/Family Education   Barriers to Discharge MD  Medical stability  Nursing      PT      OT      SLP      SW Decreased caregiver support does not have 24 hr care   Team Discharge Planning: Destination: PT-Home ,OT- Home , SLP-  Projected Follow-up: PT-Outpatient PT, OT-  None, SLP-  Projected Equipment Needs: PT-To be determined, OT- Tub/shower bench, To be determined, Tub/shower seat, SLP-  Equipment Details: PT- , OT-Some type of shower seat, TBD whether for the walk in or tub shower Patient/family involved in discharge planning: PT- Patient,  OT-Patient, SLP-   MD ELOS: 7d Medical Rehab Prognosis:  Excellent Assessment:  52 year old right-handed male with history of heart murmur, alcohol use on no prescription medications. Patient lives with roommates. Independent prior to admission. Works full-time Press photographerairplane repair. One level home. He does have parents in the area that check on him as needed with good support of roommates. Presented 11/12/2017 with nausea, vomiting as well as dizziness and incoordination. Cranial CT scan showed suggestion of asymmetrical low-attenuation and possible mass-effect in the left anterior cerebellum and cerebellar peduncle. MRI showed a 3.5 cm diameter acute infarction in the superior cerebellum on the left. No evidence of mass-effect or hemorrhage. CT angiogram of head and neck with no large vessel occlusion. Patient did not  receive TPA. Echocardiogram with ejection fraction of 65% no wall motion abnormalities.  Neurology consulted maintained on aspirin and Plavix for CVA prophylaxis x3 weeks then aspirin alone. Subcutaneous. Lovenox for DVT prophylaxis. Venous Doppler studies lower extremities negative. TEE to be completed today. Tolerating a regular diet. Physical and occupational therapy evaluations completed with recommendations of physical medicine rehab consult. Patient was admitted for a comprehensive rehab program.   Now requiring 24/7 Rehab RN,MD, as well as CIR level PT, OT  Treatment team will focus on ADLs and mobility with goals set at Mod I  See Team Conference Notes for weekly updates to the plan of care

## 2017-11-17 NOTE — Progress Notes (Signed)
Occupational Therapy Session Note  Patient Details  Name: John Mcbride MRN: [id number] Date of Birth: 07-05-1965  Today's Date: 11/17/2017 OT Individual Time: 1100-1158 OT Individual Time Calculation (min): 58 min    Short Term Goals: Week 1:  OT Short Term Goal 1 (Week 1): N/A d/t ELOS  Skilled Therapeutic Interventions/Progress Updates:    Pt resting in recliner upon arrival.  Pt declined bathing/dressing this morning but agreed to shower and changing clothing tomorrow.  Pt donned Ted hose and shoes without assistance this morning.  Pt amb with RW to ADL apartment with one rest break.  Pt c/o being light headed and requested to sit down.  Pt stated he frequently would become light headed when he was hungry.  Pt practiced shower transfers and tub transfers using shower seat.  Discussed home safety and continued discharge planning.  Pt amb with RW in ADL apartment for simple home mgmt tasks.  Pt requested snack of Graham crackers and water before returning to room.  Pt amb with RW back to room without rest breaks and reported that he didn't experience any lightheadedness.  Pt returned to recliner with all needs within reach and chair alarm activated.   Therapy Documentation Precautions:  Precautions Precautions: Fall Restrictions Weight Bearing Restrictions: No   Pain: Pain Assessment Pain Scale: 0-10 Pain Score: 0-No pain  See Function Navigator for Current Functional Status.   Therapy/Group: Individual Therapy  Rich BraveLanier, John Mcbride 11/17/2017, 11:59 AM

## 2017-11-17 NOTE — Care Management Note (Signed)
[facility] Individual Statement of Services  Patient Name:  John Mcbride  Date:  11/17/2017  Welcome to the [facility]patient [facility].  Our goal is to provide you with an individualized program based on your diagnosis and situation, designed to meet your specific needs.  With this comprehensive rehabilitation program, you will be expected to participate in at least 3 hours of rehabilitation therapies Monday-Friday, with modified therapy programming on the weekends.  Your rehabilitation program will include the following services:  Physical Therapy (PT), Occupational Therapy (OT), Speech Therapy (ST), 24 hour per day rehabilitation nursing, Therapeutic Recreaction (TR), Neuropsychology, Case Management (Social Worker), Rehabilitation Medicine, Nutrition Services and Pharmacy Services  Weekly team conferences will be held on Wednesday to discuss your progress.  Your Social Worker will talk with you frequently to get your input and to update you on team discussions.  Team conferences with you and your family in attendance may also be held.  Expected length of stay: 5-7 days  Overall anticipated outcome: independent-supervision level  Depending on your progress and recovery, your program may change. Your Social Worker will coordinate services and will keep you informed of any changes. Your Social Worker's name and contact numbers are listed  below.  The following services may also be recommended but are not provided by the Inpatient [facility]:   Driving Evaluations  Home Health Rehabiltiation Services  Outpatient Rehabilitation Services  Vocational Rehabilitation   Arrangements will be made to provide these services after discharge if needed.  Arrangements include referral to agencies that provide these services.  Your insurance has been verified to be:  BCBS Your primary doctor is:  NONE  Pertinent information will be shared with your doctor and  your insurance company.  Social Worker:  Dossie DerBecky Dallan Schonberg, SW [phone] or [phone]) [phone]  Information discussed with and copy given to patient by: Lucy Chrisupree, Bonny Vanleeuwen G, 11/17/2017, 1:59 PM

## 2017-11-18 ENCOUNTER — Inpatient Hospital Stay (HOSPITAL_COMMUNITY): Payer: Self-pay

## 2017-11-18 ENCOUNTER — Inpatient Hospital Stay (HOSPITAL_COMMUNITY): Payer: BLUE CROSS/BLUE SHIELD

## 2017-11-18 ENCOUNTER — Encounter (HOSPITAL_COMMUNITY): Payer: BLUE CROSS/BLUE SHIELD | Admitting: Psychology

## 2017-11-18 NOTE — Progress Notes (Signed)
Physical Therapy Session Note  Patient Details  Name: John Mcbride MRN: [id number] Date of Birth: Jul 31, 1965  Today's Date: 11/18/2017 PT Individual Time: 0800-0900 and [id number] PT Individual Time Calculation (min): 60 min and 40 min   Short Term Goals: Week 1:  PT Short Term Goal 1 (Week 1): STG equal LTG due to ELOS  Skilled Therapeutic Interventions/Progress Updates:   Session 1: Pt seated EOB upon PT arrival, agreeable to therapy tx and denies pain. Pt seated EOB donned shirt, shorts, socks and shoes all with supervision/set up. Pt ambulated from room>gym with RW and supervision x150 ft. Pt worked on dynamic balance during ambulation without AD to navigate obstacle course weaving through cones and stepping over objects, min assist, x 2 trials. Pt ambulated to ortho gym with RW and supervision x 100 ft. Pt participated in body weight support treadmill training for gait training x 10 minutes working on increased gait speed and increased step length with manual facilitation for lateral weightshifting. Pt ambulated back to the gym without AD min assist x 150 ft in order to assess for carry over. Pt worked on dynamic standing balance on airex while tossing ball against rebound trampoline. Pt ambulated back to room with RW and supervision x 150 ft. Pt left in recliner at end of session with needs in reach and chair alarm set.   Session 2: Pt seated in recliner upon PT arrival, agreeable to therapy tx and denies pain. Pt ambulated from room>gym x 150 ft without AD and min assist, verbal cues for increased step length. Pt reports some tightness on L side, performed stretches, each with 30 sec hold: hamstring stretch, heel cord stretch, prone on elbows, knee to chest stretch. Pt transferred to sitting with supervision. Pt worked on dynamic standing balance to perform stepping in all directions without AD over tape- forward, backward, sideways, min assist. Pt worked on gait training 2 x 150 ft with  manual facilitation for lateral weightshift and pelvic rotation, verbal cues for L arm swing, no AD, min assist, pt with improved step length and gait speed overall. Pt left in recliner at end of session with needs in reach and chair alarm set.   Therapy Documentation Precautions:  Precautions Precautions: Fall Restrictions Weight Bearing Restrictions: No   See Function Navigator for Current Functional Status.   Therapy/Group: Individual Therapy  Cresenciano GenreEmily van Schagen, PT, DPT 11/18/2017, 7:44 AM

## 2017-11-18 NOTE — Progress Notes (Signed)
Occupational Therapy Session Note  Patient Details  Name: John Mcbride MRN: [id number] Date of Birth: 10-19-1965  Today's Date: 11/18/2017 OT Individual Time: 1000-1100 OT Individual Time Calculation (min): 60 min    Short Term Goals: Week 1:  OT Short Term Goal 1 (Week 1): N/A d/t ELOS  Skilled Therapeutic Interventions/Progress Updates:    OT intervention with focus on functional amb with RW, bathing at shower level, dressing with sit<>stand from EOB, standing balance, and safety awareness to increase independence with BADLs.  Pt amb with RW to bathroom and urinated at toilet while standing and using grab bar for support.  Pt transferred to tub bench and completed bathing tasks with sit<>stand.  Pt returned to room and stood at drawers to select clothing prior to sitting EOB for dressing tasks.  Pt completed all bathing/dressing tasks at supervision level.  Pt uses LUE at diminished level for all functional tasks. Pt amb with RW in room for simple home mgmt tasks before returning to recliner.  Pt remained in recliner with chair alarm activated and all needs within reach.   Therapy Documentation Precautions:  Precautions Precautions: Fall Restrictions Weight Bearing Restrictions: No   Pain:  Pt denies pain  See Function Navigator for Current Functional Status.   Therapy/Group: Individual Therapy  John Mcbride, John Mcbride 11/18/2017, 12:07 PM

## 2017-11-18 NOTE — Progress Notes (Signed)
Subjective/Complaints:  No issues overnite  ROS - Denies CP,SOB.N/V/D  Objective: Vital Signs: Blood pressure 116/86, pulse 83, temperature 98.1 F (36.7 C), temperature source Oral, resp. rate 17, height 5' 10"  (1.778 m), weight 98.7 kg, SpO2 98 %. No results found. Results for orders placed or performed during the hospital encounter of 11/14/17 (from the past 72 hour(s))  CBC WITH DIFFERENTIAL     Status: Abnormal   Collection Time: 11/17/17  6:49 AM  Result Value Ref Range   WBC 6.4 4.0 - 10.5 K/uL   RBC 5.89 (H) 4.22 - 5.81 MIL/uL   Hemoglobin 16.4 13.0 - 17.0 g/dL   HCT 48.9 39.0 - 52.0 %   MCV 83.0 78.0 - 100.0 fL   MCH 27.8 26.0 - 34.0 pg   MCHC 33.5 30.0 - 36.0 g/dL   RDW 12.9 11.5 - 15.5 %   Platelets 256 150 - 400 K/uL   Neutrophils Relative % 56 %   Neutro Abs 3.6 1.7 - 7.7 K/uL   Lymphocytes Relative 27 %   Lymphs Abs 1.8 0.7 - 4.0 K/uL   Monocytes Relative 11 %   Monocytes Absolute 0.7 0.1 - 1.0 K/uL   Eosinophils Relative 4 %   Eosinophils Absolute 0.3 0.0 - 0.7 K/uL   Basophils Relative 1 %   Basophils Absolute 0.1 0.0 - 0.1 K/uL   Immature Granulocytes 1 %   Abs Immature Granulocytes 0.0 0.0 - 0.1 K/uL    Comment: Performed at [facility], [address], [place], [place] [address]  Comprehensive metabolic panel     Status: Abnormal   Collection Time: 11/17/17  6:49 AM  Result Value Ref Range   Sodium 141 135 - 145 mmol/L   Potassium 4.2 3.5 - 5.1 mmol/L   Chloride 104 98 - 111 mmol/L   CO2 30 22 - 32 mmol/L   Glucose, Bld 108 (H) 70 - 99 mg/dL   BUN 11 6 - 20 mg/dL   Creatinine, Ser 1.21 0.61 - 1.24 mg/dL   Calcium 9.3 8.9 - 10.3 mg/dL   Total Protein 6.8 6.5 - 8.1 g/dL   Albumin 4.2 3.5 - 5.0 g/dL   AST 28 15 - 41 U/L   ALT 40 0 - 44 U/L   Alkaline Phosphatase 44 38 - 126 U/L   Total Bilirubin 3.0 (H) 0.3 - 1.2 mg/dL   GFR calc non Af Amer >60 >60 mL/min   GFR calc Af Amer >60 >60 mL/min    Comment: (NOTE) The eGFR has been  calculated using the CKD EPI equation. This calculation has not been validated in all clinical situations. eGFR's persistently <60 mL/min signify possible Chronic Kidney Disease.    Anion gap 7 5 - 15    Comment: Performed at [facility] [address], [place], [place] [address]     HEENT: normal Cardio: RRR and  murmur Resp: CTA B/L and unlabored GI: BS positive and NT, ND Extremity:  No Edema Skin:   Intact Neuro: Alert/Oriented, Cranial Nerve II-XII normal, Normal Sensory, Normal Motor, Abnormal FMC Ataxic/ dec FMC and Other Moderate ataxia Left FInger nose finger, mild ataxia L Heel Shin Musc/Skel:  Other No pain with upper limb or lower limb range of motion General no acute distress   Assessment/Plan: 1. Functional deficits secondary to left cerebellar infarct which require 3+ hours per day of interdisciplinary therapy in a comprehensive inpatient rehab setting. Physiatrist is providing close team supervision and 24 hour management  of active medical problems listed below. Physiatrist and rehab team continue to assess barriers to discharge/monitor patient progress toward functional and medical goals. FIM: Function - Bathing Position: Shower Body parts bathed by patient: Right arm, Left arm, Chest, Abdomen, Front perineal area, Buttocks, Right upper leg, Left upper leg, Right lower leg, Left lower leg, Back Assist Level: Supervision or verbal cues  Function- Upper Body Dressing/Undressing What is the patient wearing?: Pull over shirt/dress Pull over shirt/dress - Perfomed by patient: Thread/unthread right sleeve, Thread/unthread left sleeve, Put head through opening, Pull shirt over trunk Assist Level: More than reasonable time Function - Lower Body Dressing/Undressing What is the patient wearing?: Pants, Underwear, Socks Position: Sitting EOB Underwear - Performed by patient: Thread/unthread right underwear leg, Thread/unthread left underwear leg, Pull underwear  up/down Pants- Performed by patient: Thread/unthread right pants leg, Thread/unthread left pants leg, Pull pants up/down Socks - Performed by patient: Don/doff right sock, Don/doff left sock Assist for footwear: Supervision/touching assist Assist for lower body dressing: Touching or steadying assistance (Pt > 75%)  Function - Toileting Toileting steps completed by patient: Adjust clothing prior to toileting, Performs perineal hygiene, Adjust clothing after toileting Assist level: Supervision or verbal cues  Function - Air cabin crew transfer assistive device: Walker Assist level to toilet: Touching or steadying assistance (Pt > 75%) Assist level from toilet: Touching or steadying assistance (Pt > 75%)  Function - Chair/bed transfer Chair/bed transfer method: Ambulatory Chair/bed transfer assist level: Touching or steadying assistance (Pt > 75%) Chair/bed transfer assistive device: Armrests Chair/bed transfer details: Manual facilitation for weight shifting, Verbal cues for technique, Verbal cues for sequencing  Function - Locomotion: Wheelchair Will patient use wheelchair at discharge?: No Type: Manual Max wheelchair distance: (150 ft) Assist Level: Supervision or verbal cues Assist Level: Supervision or verbal cues Assist Level: Supervision or verbal cues Turns around,maneuvers to table,bed, and toilet,negotiates 3% grade,maneuvers on rugs and over doorsills: No Function - Locomotion: Ambulation Assistive device: No device Max distance: 150 ft Assist level: Touching or steadying assistance (Pt > 75%) Assist level: Touching or steadying assistance (Pt > 75%) Walk 50 feet with 2 turns activity did not occur: Safety/medical concerns(Limited stability) Assist level: Touching or steadying assistance (Pt > 75%) Walk 150 feet activity did not occur: Safety/medical concerns(Endurance) Assist level: Touching or steadying assistance (Pt > 75%) Walk 10 feet on uneven surfaces  activity did not occur: Safety/medical concerns(Decreased balance/ataxia)  Function - Comprehension Comprehension: Auditory Comprehension assist level: Follows complex conversation/direction with extra time/assistive device  Function - Expression Expression: Verbal Expression assist level: Expresses complex ideas: With extra time/assistive device  Function - Social Interaction Social Interaction assist level: Interacts appropriately with others with medication or extra time (anti-anxiety, antidepressant).  Function - Problem Solving Problem solving assist level: Solves complex 90% of the time/cues < 10% of the time  Function - Memory Memory assist level: Complete Independence: No helper Patient normally able to recall (first 3 days only): Current season, Location of own room, Staff names and faces, That he or she is in a hospital  Medical Problem List and Plan:  1. Decreased functional ability with dizziness and ataxia secondary to left cerebellar infarction. Other vestibular symptoms including nausea are improving.  -CIR  PT, OT, team conf in am 2. DVT Prophylaxis/Anticoagulation: Subcutaneous Lovenox. Monitor for any bleeding episodes. Venous Doppler studies negative , Plavix and aspirin for stroke prophylaxis 3. Pain Management: Tylenol as needed  4. Mood: Provide emotional support  5. Neuropsych: This patient is capable  of making decisions on his own behalf.  6. Skin/Wound Care: Routine skin checks  7. Fluids/Electrolytes/Nutrition: Routine in and outs with follow-up chemistries upon admit Recorded fluid intake 700 mL on 11/16/2017 8. Hyperlipidemia.LDL mildy elevated cont  Lipitor  9. Question alcohol use. Provide counseling, no signs of withdrawal thus far Labs reviewed BMET and CBC normal LOS (Days) 4 A FACE TO FACE EVALUATION WAS PERFORMED  Charlett Blake 11/18/2017, 7:31 AM

## 2017-11-18 NOTE — Evaluation (Signed)
Recreational Therapy Assessment and Plan  Patient Details  Name: John Mcbride MRN: [id number] Date of Birth: 12-Sep-1965 Today's Date: 11/18/2017  Rehab Potential:Good  ELOS:   7 days  Assessment    Problem List:      Patient Active Problem List   Diagnosis Date Noted  . Cerebellar ataxia ([place]) 11/14/2017  . Cerebellar infarct ([place])   . Essential hypertension   . Cerebellar stroke ([place]) 11/12/2017  . Nausea and vomiting 11/12/2017  . Dizziness and giddiness 11/12/2017  . Hyperglycemia 11/12/2017  . Leukocytosis 11/12/2017  . Alcohol use 11/12/2017    Past Medical History:      Past Medical History:  Diagnosis Date  . Heart murmur    Past Surgical History: History reviewed. No pertinent surgical history.  Assessment & Plan Clinical Impression: John Mcbride is a 52 year old right-handed male with history of heart murmur, alcohol use on no prescription medications. Patient lives with roommates. Independent prior to admission. Works full-time Chief Financial Officer. One level home. He does have parents in the area that check on him as needed with good support of roommates. Presented 11/12/2017 with nausea, vomiting as well as dizziness and incoordination. Cranial CT scan showed suggestion of asymmetrical low-attenuation and possible mass-effect in the left anterior cerebellum and cerebellar peduncle. MRI showed a 3.5 cm diameter acute infarction in the superior cerebellum on the left. No evidence of mass-effect or hemorrhage. CT angiogram of head and neck with no large vessel occlusion. Patient did not receive TPA. Echocardiogram with ejection fraction of 65% no wall motion abnormalities. Neurology consulted maintained on aspirin and Plavix for CVA prophylaxis x3 weeks then aspirin alone. Subcutaneous. Lovenox for DVT prophylaxis. Venous Doppler studies lower extremities negative. TEE to be completed today. Tolerating a regular diet. Physical and occupational therapy evaluations  completed with recommendations of physical medicine rehab consult. Patient was admitted for a comprehensive rehab program. Patient transferred to CIR on 11/14/2017.    Pt presents with decreased activity tolerance, decreased functional mobility, decreased balance, decreased coordination Limiting pt's independence with leisure/community pursuits.  Plan Min 1 TR session >60 minutes during LOS for community reintegration  Recommendations for other services: None   Discharge Criteria: Patient will be discharged from TR if patient refuses treatment 3 consecutive times without medical reason.  If treatment goals not met, if there is a change in medical status, if patient makes no progress towards goals or if patient is discharged from hospital.  The above assessment, treatment plan, treatment alternatives and goals were discussed and mutually agreed upon: by patient   Met with pt today to discuss community reintegration/outing per team request.  Discussed purpose of the outing and potential goals.  Pt agreeable to participate in an outing to be scheduled Thursday 8/22.  See goal sheet in shadow chart for specific goals and pt progress after the outing.  [place] 11/18/2017, 1:57 PM

## 2017-11-18 NOTE — Progress Notes (Signed)
Occupational Therapy Session Note  Patient Details  Name: John Mcbride MRN: [id number] Date of Birth: [birth year]  Today's Date: 11/18/2017 OT Individual Time: 1330-1400 OT Individual Time Calculation (min): 30 min    Short Term Goals: Week 1:  OT Short Term Goal 1 (Week 1): N/A d/t ELOS  Skilled Therapeutic Interventions/Progress Updates:    Pt resting in recliner upon arrival.  Pt amb with RW to day room and engaged in LUE activities with bolts and nuts, using LUE to remove nuts from bolts and replacing.  Pt completed tasks while standing.  Focus on increased LUE function to increase independence with BADLs. Pt returned to room and used toilet in standing.  Slight LUE dysmetria noted when flushing toilet.  Pt returned to recliner with all needs within reach and chair alarm activated.   Therapy Documentation Precautions:  Precautions Precautions: Fall Restrictions Weight Bearing Restrictions: No Pain:  Pt denies pain  See Function Navigator for Current Functional Status.   Therapy/Group: Individual Therapy  Rich BraveLanier, John Mcbride 11/18/2017, 2:30 PM

## 2017-11-19 ENCOUNTER — Inpatient Hospital Stay (HOSPITAL_COMMUNITY): Payer: BLUE CROSS/BLUE SHIELD

## 2017-11-19 ENCOUNTER — Inpatient Hospital Stay (HOSPITAL_COMMUNITY): Payer: Self-pay

## 2017-11-19 ENCOUNTER — Inpatient Hospital Stay (HOSPITAL_COMMUNITY): Payer: BLUE CROSS/BLUE SHIELD | Admitting: Physical Therapy

## 2017-11-19 NOTE — Progress Notes (Signed)
Physical Therapy Session Note  Patient Details  Name: John SidleDavid G Kiner MRN: [id number] Date of Birth: 03-30-1966  Today's Date: 11/19/2017 PT Individual Time: 0902-1000 and 1515-1545 PT Individual Time Calculation (min): 58 min and 30 min  Short Term Goals: Week 1:  PT Short Term Goal 1 (Week 1): STG equal LTG due to ELOS  Skilled Therapeutic Interventions/Progress Updates:    Session 1: Pt seated EOB upon PT arrival, agreeable to therapy tx and denies pain. Pt transferred to standing with supervision and ambulated from room>gym x 200 ft without AD, min assist, therapist providing manual facilitation for lateral weightshifting and pelvic rotation. Pt ambulated x 80 ft without AD min assist without manual facilitation to assess for carry over. Pt ambulated in a circle in each direction working on turns, min assist, x 2 trials. Pt ambulated to the steps with min guard assist, ascended/descended 12 steps with R handrail and reciprocal pattern CGA. Pt ambulated x 500 ft for gait training without AD working on increased step length and weightshifting with manual facilitation for anteriolateral weightshift and pelvic rotation, min assist. Pt performed long sitting hamstring stretch 3 x 30 sec and heel cord step off bottom step 2 x 30 sec. Pt worked on dynamic standing balance while ambulating and tossing ball, min assist 2 x 85 ft. Pt ambulated back to room with min assist. Left seated in recliner with chair alarm set.   Session 2: Pt seated EOB upon PT arrival, agreeable to therapy tx and denies pain. Pt transferred to standing with supervision and ambulated from room>gym x 200 ft without AD, min assist, therapist providing manual facilitation for lateral weightshifting and pelvic rotation. Pt reports some tightness in hips, therapist performed supine piriformis stretch and glute stretch. Pt transferred onto theraball and performed pelvic circles in each direction working on weightshifting and pelvic  rotation. Pt seated on theraball performed alternating LE marches working on dynamic balance and postural control. Pt transferred to mat with min assist. Performed reciprocal scooting forwards/backwards on mat working on weightshifting and pelvic rotation. Pt ambulated back to room x200 ft min guard, left seated in recliner with needs in reach.   Therapy Documentation Precautions:  Precautions Precautions: Fall Restrictions Weight Bearing Restrictions: No  See Function Navigator for Current Functional Status.   Therapy/Group: Individual Therapy  Cresenciano GenreEmily van Schagen, PT, DPT 11/19/2017, 7:53 AM

## 2017-11-19 NOTE — Progress Notes (Signed)
Physical Therapy Session Note  Patient Details  Name: John Mcbride MRN: [id number] Date of Birth: [birth year]  Today's Date: 11/19/2017 PT Individual Time: 1015-1100 PT Individual Time Calculation (min): 45 min   Short Term Goals: Week 1:  PT Short Term Goal 1 (Week 1): STG equal LTG due to ELOS  Skilled Therapeutic Interventions/Progress Updates:    Patient received up in recliner, pleasant and willing to participate in therapy this morning. Worked on gait training with no device, able to ambulate approximately 129f no device with min guard to Dynamap where dynamic reaching/reaction time activities were performed at various speeds and on and off of foam pad, also in various positions to challenge balance. Further worked on functional balance activities on foam pad in the gym involving single limb stance as well as cross midline reaching with min guard/occasional minA for balance assist. Finished session on Nustep LEs only, starting resistance 5 and progressing to level 7 for 8 minutes. Ambulated back to room with no device and min guard, and he was left up in the chair with all needs met, chair alarm activated this morning.   Therapy Documentation Precautions:  Precautions Precautions: Fall Restrictions Weight Bearing Restrictions: No Pain: Pain Assessment Pain Scale: 0-10 Pain Score: 0-No pain   See Function Navigator for Current Functional Status.   Therapy/Group: Individual Therapy  KDeniece ReePT, DPT, CBIS  Supplemental Physical Therapist [facility]  Pager [phone]   11/19/2017, 12:19 PM

## 2017-11-19 NOTE — Patient Care Conference (Signed)
Inpatient RehabilitationTeam Conference and Plan of Care Update Date: 11/19/2017   Time: 10:24 AM    Patient Name: John Mcbride      Medical Record Number: [id number]  Date of Birth: 10-21-1965 Sex: Male         Room/Bed: 4W03C/4W03C-01 Payor Info: Payor: BLUE CROSS BLUE SHIELD / Plan: BCBS OTHER / Product Type: *No Product type* /    Admitting Diagnosis: CVA  Admit Date/Time:  11/14/2017  3:45 PM Admission Comments: No comment available   Primary Diagnosis:  <principal problem not specified> Principal Problem: <principal problem not specified>  Patient Active Problem List   Diagnosis Date Noted  . Cerebellar ataxia ([facility]) 11/14/2017  . Cerebellar infarct ([facility])   . Essential hypertension   . Cerebellar stroke ([facility]) 11/12/2017  . Nausea and vomiting 11/12/2017  . Dizziness and giddiness 11/12/2017  . Hyperglycemia 11/12/2017  . Leukocytosis 11/12/2017  . Alcohol use 11/12/2017    Expected Discharge Date: Expected Discharge Date: 11/21/17  Team Members Present: Physician leading conference: Dr. Claudette LawsAndrew [name] Social Worker Present: Dossie DerBecky Richanda Darin, LCSW Nurse Present: Ronny BaconWhitney Reardon, RN PT Present: Woodfin GanjaEmily Van Shagen, PT OT Present: Roney MansJennifer Smith, OT;Ardis Rowanom Lanier, COTA SLP Present: Feliberto Gottronourtney Payne, SLP PPS Coordinator present : Tora DuckMarie Noel, RN, [facility], TheCRRN     Current Status/Progress Goal Weekly Team Focus  Medical   Patient is falling toward the left side.  His limb ataxia is mild  Reduce fall risk  Select appropriate assistive device establish techniques to avoid falls at home, monitor for bleeding on aspirin Plavix and Lovenox   Bowel/Bladder   continent of b/b, LBM 8/20,   mod I   monitor b/b q shift and prn   Swallow/Nutrition/ Hydration             ADL's   supervision/steady A overall for BADLs and simple home mgmt tasks with RW; LUE dysmetria improving  independent with assistive device overall  activity tolerance; functional amb with RW for home mgmt tasks, LUE NMR,  safety awareness   Mobility   min assist gait up to 150 ft without AD, supervision bed mobility and sit<>stand  supervision/mod I  postural control, balance, balance reactions, gait training, steps   Communication             Safety/Cognition/ Behavioral Observations            Pain   no c/o of pain  pain <2  monitor pain q shift and prn   Skin   not an issue  no breakdown while on IPR  monitor skin q shift and prn      *See Care Plan and progress notes for long and short-term goals.     Barriers to Discharge  Current Status/Progress Possible Resolutions Date Resolved   Physician    Medical stability     Progressing towards goals  Continue rehab      Nursing                  PT                    OT                  SLP                SW Decreased caregiver support does not have 24 hr care            Discharge Planning/Teaching Needs:  Home with roommate who works during the  day but is there in the evenings. He will have intermittent checks during the day by family/freinds.      Team Discussion:  Goals mod/i level, working on balance issues and safety awareness. Leans to the left. BERG 31/56. Making progress in therapies and medically is stable. Will need PCP for follow up since does not have one. Benefit from OP therapies.  Revisions to Treatment Plan:  DC 8/23    Continued Need for Acute Rehabilitation Level of Care: The patient requires daily medical management by a physician with specialized training in physical medicine and rehabilitation for the following conditions: Daily direction of a multidisciplinary physical rehabilitation program to ensure safe treatment while eliciting the highest outcome that is of practical value to the patient.: Yes Daily medical management of patient stability for increased activity during participation in an intensive rehabilitation regime.: Yes Daily analysis of laboratory values and/or radiology reports with any subsequent need for  medication adjustment of medical intervention for : Neurological problems  John Mcbride, John LivingsRebecca G 11/20/2017, 10:24 AM

## 2017-11-19 NOTE — Progress Notes (Signed)
Occupational Therapy Session Note  Patient Details  Name: John Mcbride MRN: [id number] Date of Birth: 1965-08-27  Today's Date: 11/19/2017 OT Individual Time: 1300-1400 OT Individual Time Calculation (min): 60 min    Short Term Goals: Week 1:  OT Short Term Goal 1 (Week 1): N/A d/t ELOS  Skilled Therapeutic Interventions/Progress Updates:    OT intervention with focus on functional amb with/without AD for home mgmt tasks, dynamic standing balance, discharge planning, activity tolerance, and safety awareness to increase independence with BADLs. Pt engaged in dynamic standing activities tossing 1kg ball against mini trampoline while standing on compliant and noncompliant surface.  Pt required steady A while standing on compliant surface. Pt also engaged in amb without AD (steady A) to gather items with LUE placed about shoulder height.  Pt noted with lessening dysmetria. Pt also engaged in Clinical biochemistBiodex catch game.  Pt with methodical balance reactions. Pt returned to room and remained in recliner with chair alarm activated and all needs within reach.   Therapy Documentation Precautions:  Precautions Precautions: Fall Restrictions Weight Bearing Restrictions: No Pain:  Pt denies pain  See Function Navigator for Current Functional Status.   Therapy/Group: Individual Therapy  John Mcbride, John Mcbride 11/19/2017, 2:58 PM

## 2017-11-19 NOTE — Progress Notes (Signed)
Social Work Patient ID: John Mcbride, male   DOB: 03-14-1966, 52 y.o.   MRN: [id number] Met with pt to discuss team conference goals mod/i level and target discharge date 8/23. He feels he is doing well but his balance is his main issue. He will have transport to go to OP therapies. Will work on getting him a PCP and any equipment he may need in preparation for discharge.

## 2017-11-19 NOTE — Consult Note (Signed)
Neuropsychological Consultation   Patient:   John Mcbride   DOB:   1965/07/01  MR Number:  [id number]  [facility]  [facility] [id number] [place] [id number] Dept: [phone] Loc: [phone]           Date of Service:   11/18/2017  Start Time:   3 PM End Time:   4 PM  Provider/Observer:  Arley PhenixJohn , Psy.D.       Clinical Neuropsychologist       Billing Code/Service: [id number] 4 Units  Chief Complaint:    John Mcbride is a 52 year old male with a history of heart murmur, alcohol use.  The patient presented on 11/12/2017 with nausea, vomiting as well as dizziness and coordination issues.  MRI showed a 3.5 cm diameter acute infarct in the superior cerebellum on the left side.  There is no evidence of mass-effect or hemorrhage.  Neurology suggested conservative treatment with aspirin and Plavix for CVA prophylaxis weeks.  The patient initially had not only changes in motor function and coordination he also had a significant disturbance in mood and stress response.  The mood changes were also likely directly related to the cerebellum infarction is the type of mood changes are aspects that appear to be related to cerebellum function along with motor function.  Reason for Service:  [name] was referred for neuropsychological consultation due to coping and adjustment issues as well as looking issues related to mood dysregulation.  The patient had no prior history of mood dysregulation.  Below is the HPI for the current admission.  HPI: John Mcbride is a 52 year old right-handed male with history of heart murmur, alcohol use on no prescription medications. Patient lives with roommates. Independent prior to admission. Works full-time Press photographerairplane repair. One level home. He does have parents in the area that check on him as needed with good support of roommates. Presented 11/12/2017 with nausea,  vomiting as well as dizziness and incoordination. Cranial CT scan showed suggestion of asymmetrical low-attenuation and possible mass-effect in the left anterior cerebellum and cerebellar peduncle. MRI showed a 3.5 cm diameter acute infarction in the superior cerebellum on the left. No evidence of mass-effect or hemorrhage. CT angiogram of head and neck with no large vessel occlusion. Patient did not receive TPA. Echocardiogram with ejection fraction of 65% no wall motion abnormalities. Neurology consulted maintained on aspirin and Plavix for CVA prophylaxis x3 weeks then aspirin alone. Subcutaneous. Lovenox for DVT prophylaxis. Venous Doppler studies lower extremities negative. TEE to be completed today. Tolerating a regular diet. Physical and occupational therapy evaluations completed with recommendations of physical medicine rehab consult. Patient was admitted for a comprehensive rehab program.   Current Status:  The patient reports that while he did have a lot of mood dysregulation and agitation initially after the stroke that his mood has stabilized considerably.  The patient reports that he continues to have some difficulties with coordination but not primary motor function.  The patient reports that the symptoms are improving and that he is remaining well motivated and actively working on the therapeutic interventions.  Behavioral Observation: John Mcbride  presents as a 52 y.o.-year-old Right  Male who appeared his stated age. his dress was Appropriate and he was Well Groomed and his manners were Appropriate to the situation.  his participation was indicative of Appropriate and Attentive behaviors.  There were any physical disabilities noted.  he displayed an appropriate level  of cooperation and motivation.     Interactions:    Active Appropriate and Attentive  Attention:   within normal limits and attention span and concentration were age appropriate  Memory:   within normal limits; recent and  remote memory intact  Visuo-spatial:  not examined  Speech (Volume):  normal  Speech:   normal; normal  Thought Process:  Coherent and Relevant  Though Content:  WNL; not suicidal and not homicidal  Orientation:   person, place, time/date and situation  Judgment:   Good  Planning:   Good  Affect:    Appropriate  Mood:    Dysphoric  Insight:   Good  Intelligence:   normal  Medical History:   Past Medical History:  Diagnosis Date  . Heart murmur     Psychiatric History:  The patient denies any prior history of depression or anxiety type symptoms.  Family Med/Psych History:  Family History  Problem Relation Age of Onset  . Cancer Mother   . Cancer Father   . Heart disease Father     Risk of Suicide/Violence: low the patient denies any suicidal or homicidal ideation peer  Impression/DX:  John Mcbride is a 52 year old male with a history of heart murmur, alcohol use.  The patient presented on 11/12/2017 with nausea, vomiting as well as dizziness and coordination issues.  MRI showed a 3.5 cm diameter acute infarct in the superior cerebellum on the left side.  There is no evidence of mass-effect or hemorrhage.  Neurology suggested conservative treatment with aspirin and Plavix for CVA prophylaxis weeks.  The patient initially had not only changes in motor function and coordination he also had a significant disturbance in mood and stress response.  The mood changes were also likely directly related to the cerebellum infarction is the type of mood changes are aspects that appear to be related to cerebellum function along with motor function.  The patient reports that while he did have a lot of mood dysregulation and agitation initially after the stroke that his mood has stabilized considerably.  The patient reports that he continues to have some difficulties with coordination but not primary motor function.  The patient reports that the symptoms are improving and that he is  remaining well motivated and actively working on the therapeutic interventions.   Disposition/Plan:  Today we worked on coping and adjustment issues.  However, the patient reports that his mood has been becoming much more stabilized and he is not feeling vague feelings of being in danger or threat like he had been before.  The patient reports that he is remaining positive and can feel improvements in his overall functioning.  The patient reports that he remains actively working on the therapeutic efforts and feels motivated.  Memory and cognition appear to be intact.          Electronically Signed   _______________________ Arley PhenixJohn , Psy.D.

## 2017-11-19 NOTE — Progress Notes (Signed)
Subjective/Complaints:  Eating breakfast , no problem with bowel or bladder  ROS - Denies CP,SOB.N/V/D  Objective: Vital Signs: Blood pressure 93/69, pulse 95, temperature 97.7 F (36.5 C), temperature source Oral, resp. rate 18, height 5' 10"  (1.778 m), weight 98.5 kg, SpO2 98 %. No results found. Results for orders placed or performed during the hospital encounter of 11/14/17 (from the past 72 hour(s))  CBC WITH DIFFERENTIAL     Status: Abnormal   Collection Time: 11/17/17  6:49 AM  Result Value Ref Range   WBC 6.4 4.0 - 10.5 K/uL   RBC 5.89 (H) 4.22 - 5.81 MIL/uL   Hemoglobin 16.4 13.0 - 17.0 g/dL   HCT 48.9 39.0 - 52.0 %   MCV 83.0 78.0 - 100.0 fL   MCH 27.8 26.0 - 34.0 pg   MCHC 33.5 30.0 - 36.0 g/dL   RDW 12.9 11.5 - 15.5 %   Platelets 256 150 - 400 K/uL   Neutrophils Relative % 56 %   Neutro Abs 3.6 1.7 - 7.7 K/uL   Lymphocytes Relative 27 %   Lymphs Abs 1.8 0.7 - 4.0 K/uL   Monocytes Relative 11 %   Monocytes Absolute 0.7 0.1 - 1.0 K/uL   Eosinophils Relative 4 %   Eosinophils Absolute 0.3 0.0 - 0.7 K/uL   Basophils Relative 1 %   Basophils Absolute 0.1 0.0 - 0.1 K/uL   Immature Granulocytes 1 %   Abs Immature Granulocytes 0.0 0.0 - 0.1 K/uL    Comment: Performed at [facility], [address], [place], [place] [address]  Comprehensive metabolic panel     Status: Abnormal   Collection Time: 11/17/17  6:49 AM  Result Value Ref Range   Sodium 141 135 - 145 mmol/L   Potassium 4.2 3.5 - 5.1 mmol/L   Chloride 104 98 - 111 mmol/L   CO2 30 22 - 32 mmol/L   Glucose, Bld 108 (H) 70 - 99 mg/dL   BUN 11 6 - 20 mg/dL   Creatinine, Ser 1.21 0.61 - 1.24 mg/dL   Calcium 9.3 8.9 - 10.3 mg/dL   Total Protein 6.8 6.5 - 8.1 g/dL   Albumin 4.2 3.5 - 5.0 g/dL   AST 28 15 - 41 U/L   ALT 40 0 - 44 U/L   Alkaline Phosphatase 44 38 - 126 U/L   Total Bilirubin 3.0 (H) 0.3 - 1.2 mg/dL   GFR calc non Af Amer >60 >60 mL/min   GFR calc Af Amer >60 >60 mL/min    Comment:  (NOTE) The eGFR has been calculated using the CKD EPI equation. This calculation has not been validated in all clinical situations. eGFR's persistently <60 mL/min signify possible Chronic Kidney Disease.    Anion gap 7 5 - 15    Comment: Performed at [facility] [address] [place], [place] [address]     HEENT: normal Cardio: RRR and  murmur Resp: CTA B/L and unlabored GI: BS positive and NT, ND Extremity:  No Edema Skin:   Intact Neuro: Alert/Oriented, Cranial Nerve II-XII normal, Normal Sensory, Normal Motor, Abnormal FMC Ataxic/ dec FMC and Other Moderate ataxia Left FInger nose finger, mild ataxia L Heel Shin Musc/Skel:  Other No pain with upper limb or lower limb range of motion General no acute distress   Assessment/Plan: 1. Functional deficits secondary to left cerebellar infarct which require 3+ hours per day of interdisciplinary therapy in a comprehensive inpatient rehab setting. Physiatrist is providing close  team supervision and 24 hour management of active medical problems listed below. Physiatrist and rehab team continue to assess barriers to discharge/monitor patient progress toward functional and medical goals. FIM: Function - Bathing Position: Shower Body parts bathed by patient: Right arm, Left arm, Chest, Abdomen, Front perineal area, Buttocks, Right upper leg, Left upper leg, Left lower leg, Right lower leg Assist Level: Supervision or verbal cues  Function- Upper Body Dressing/Undressing What is the patient wearing?: Pull over shirt/dress Pull over shirt/dress - Perfomed by patient: Thread/unthread right sleeve, Thread/unthread left sleeve, Put head through opening, Pull shirt over trunk Assist Level: No help, No cues Function - Lower Body Dressing/Undressing What is the patient wearing?: Underwear, Pants, Socks, Shoes Position: Sitting EOB Underwear - Performed by patient: Thread/unthread right underwear leg, Thread/unthread left underwear leg,  Pull underwear up/down Pants- Performed by patient: Thread/unthread right pants leg, Thread/unthread left pants leg, Pull pants up/down Socks - Performed by patient: Don/doff right sock, Don/doff left sock Shoes - Performed by patient: Don/doff right shoe, Don/doff left shoe, Fasten right, Fasten left Assist for footwear: Supervision/touching assist Assist for lower body dressing: Supervision or verbal cues  Function - Toileting Toileting steps completed by patient: Adjust clothing prior to toileting, Performs perineal hygiene, Adjust clothing after toileting Assist level: Supervision or verbal cues  Function - Air cabin crew transfer assistive device: Walker Assist level to toilet: Supervision or verbal cues Assist level from toilet: Supervision or verbal cues  Function - Chair/bed transfer Chair/bed transfer method: Ambulatory Chair/bed transfer assist level: Touching or steadying assistance (Pt > 75%) Chair/bed transfer assistive device: Armrests Chair/bed transfer details: Manual facilitation for weight shifting, Verbal cues for technique, Verbal cues for sequencing  Function - Locomotion: Wheelchair Will patient use wheelchair at discharge?: No Type: Manual Max wheelchair distance: (150 ft) Assist Level: Supervision or verbal cues Assist Level: Supervision or verbal cues Assist Level: Supervision or verbal cues Turns around,maneuvers to table,bed, and toilet,negotiates 3% grade,maneuvers on rugs and over doorsills: No Function - Locomotion: Ambulation Assistive device: No device Max distance: 150 ft Assist level: Touching or steadying assistance (Pt > 75%) Assist level: Touching or steadying assistance (Pt > 75%) Walk 50 feet with 2 turns activity did not occur: Safety/medical concerns(Limited stability) Assist level: Touching or steadying assistance (Pt > 75%) Walk 150 feet activity did not occur: Safety/medical concerns(Endurance) Assist level: Touching or  steadying assistance (Pt > 75%) Walk 10 feet on uneven surfaces activity did not occur: Safety/medical concerns(Decreased balance/ataxia)  Function - Comprehension Comprehension: Auditory Comprehension assist level: Follows complex conversation/direction with extra time/assistive device  Function - Expression Expression: Verbal Expression assist level: Expresses complex ideas: With extra time/assistive device  Function - Social Interaction Social Interaction assist level: Interacts appropriately with others with medication or extra time (anti-anxiety, antidepressant).  Function - Problem Solving Problem solving assist level: Solves complex 90% of the time/cues < 10% of the time  Function - Memory Memory assist level: Complete Independence: No helper Patient normally able to recall (first 3 days only): Current season, Location of own room, Staff names and faces, That he or she is in a hospital  Medical Problem List and Plan:  1. Decreased functional ability with dizziness and ataxia secondary to left cerebellar infarction. Other vestibular symptoms including nausea are improving.  -CIR  PT, OT, Team conference today please see physician documentation under team conference tab, met with team face-to-face to discuss problems,progress, and goals. Formulized individual treatment plan based on medical history, underlying problem and comorbidities.  2. DVT Prophylaxis/Anticoagulation: Subcutaneous Lovenox. Monitor for any bleeding episodes. Venous Doppler studies negative , Plavix and aspirin for stroke prophylaxis 3. Pain Management: Tylenol as needed  4. Mood: Provide emotional support  5. Neuropsych: This patient is capable of making decisions on his own behalf.  6. Skin/Wound Care: Routine skin checks  7. Fluids/Electrolytes/Nutrition: Routine in and outs with follow-up chemistries upon admit Recorded fluid intake 1020 mL on 11/18/2017 8. Hyperlipidemia.LDL mildy elevated cont  Lipitor  9.  Question alcohol use. Provide counseling, no signs of withdrawal thus far Labs reviewed BMET and CBC normal LOS (Days) 5 A FACE TO FACE EVALUATION WAS PERFORMED  John Mcbride 11/19/2017, 7:54 AM

## 2017-11-19 NOTE — Plan of Care (Signed)
  Problem: Consults Goal: RH STROKE PATIENT EDUCATION Description See Patient Education module for education specifics  Outcome: Progressing   Problem: RH BOWEL ELIMINATION Goal: RH STG MANAGE BOWEL W/MEDICATION W/ASSISTANCE Description STG Manage Bowel with Medication with mod I Assistance.  Outcome: Progressing   Problem: RH SKIN INTEGRITY Goal: RH STG SKIN FREE OF INFECTION/BREAKDOWN Description Patients skin will remain free from infection or breakdown with mod I assist.  Outcome: Progressing   Problem: RH SAFETY Goal: RH STG ADHERE TO SAFETY PRECAUTIONS W/ASSISTANCE/DEVICE Description STG Adhere to Safety Precautions With supervision Assistance/Device.  Outcome: Progressing   Problem: RH KNOWLEDGE DEFICIT Goal: RH STG INCREASE KNOWLEDGE OF HYPERTENSION Description Min assist  Outcome: Progressing Goal: RH STG INCREASE KNOWLEGDE OF HYPERLIPIDEMIA Description Min assist  Outcome: Progressing Goal: RH STG INCREASE KNOWLEDGE OF STROKE PROPHYLAXIS Description Min assist  Outcome: Progressing

## 2017-11-20 ENCOUNTER — Inpatient Hospital Stay (HOSPITAL_COMMUNITY): Payer: BLUE CROSS/BLUE SHIELD | Admitting: Physical Therapy

## 2017-11-20 ENCOUNTER — Ambulatory Visit: Payer: BLUE CROSS/BLUE SHIELD

## 2017-11-20 ENCOUNTER — Encounter (HOSPITAL_COMMUNITY): Payer: BLUE CROSS/BLUE SHIELD | Admitting: *Deleted

## 2017-11-20 DIAGNOSIS — I639 Cerebral infarction, unspecified: Secondary | ICD-10-CM | POA: Diagnosis not present

## 2017-11-20 DIAGNOSIS — G119 Hereditary ataxia, unspecified: Secondary | ICD-10-CM | POA: Diagnosis not present

## 2017-11-20 MED ORDER — ACETAMINOPHEN 325 MG PO TABS: 650.0000 mg | ORAL_TABLET | ORAL | Status: DC | PRN

## 2017-11-20 MED ORDER — ATORVASTATIN CALCIUM 20 MG PO TABS: 20.0000 mg | ORAL_TABLET | Freq: Every day | ORAL | 0 refills | Status: AC

## 2017-11-20 MED ORDER — CLOPIDOGREL BISULFATE 75 MG PO TABS: 75.0000 mg | ORAL_TABLET | Freq: Every day | ORAL | 0 refills | Status: DC

## 2017-11-20 NOTE — Progress Notes (Signed)
Recreational Therapy Discharge Summary Patient Details  Name: John Mcbride MRN: [id number] Date of Birth: Jun 01, 1965 Today's Date: 11/20/2017 Comments on progress toward goals: Pt has made great progress during LOS and is ready for discharge home with intermittent supervision.  TR session focused on community reintegration ambulatory level.  All goals met.  See goal sheet in shadow chart for details.  Reasons for discharge: discharge from hospital Patient/family agrees with progress made and goals achieved: Yes  Blanton Kardell 11/20/2017, 11:51 AM

## 2017-11-20 NOTE — Plan of Care (Signed)
  Problem: Consults Goal: RH STROKE PATIENT EDUCATION Description See Patient Education module for education specifics  Outcome: Progressing   Problem: RH BOWEL ELIMINATION Goal: RH STG MANAGE BOWEL W/MEDICATION W/ASSISTANCE Description STG Manage Bowel with Medication with mod I Assistance.  Outcome: Progressing   Problem: RH SKIN INTEGRITY Goal: RH STG SKIN FREE OF INFECTION/BREAKDOWN Description Patients skin will remain free from infection or breakdown with mod I assist.  Outcome: Progressing   Problem: RH SAFETY Goal: RH STG ADHERE TO SAFETY PRECAUTIONS W/ASSISTANCE/DEVICE Description STG Adhere to Safety Precautions With supervision Assistance/Device.  Outcome: Progressing   Problem: RH KNOWLEDGE DEFICIT Goal: RH STG INCREASE KNOWLEDGE OF HYPERTENSION Description Min assist  Outcome: Progressing Goal: RH STG INCREASE KNOWLEGDE OF HYPERLIPIDEMIA Description Min assist  Outcome: Progressing Goal: RH STG INCREASE KNOWLEDGE OF STROKE PROPHYLAXIS Description Min assist  Outcome: Progressing

## 2017-11-20 NOTE — Progress Notes (Signed)
Subjective/Complaints: No new issues overnite Per RN, no safety issues. Sleeps and eats well no bowel or bladder issues  ROS - Denies CP,SOB.N/V/D  Objective: Vital Signs: Blood pressure 98/68, pulse 87, temperature 98 F (36.7 C), temperature source Oral, resp. rate 16, height 5\' 10"  (1.778 m), weight 98.6 kg, SpO2 97 %. No results found. No results found for this or any previous visit (from the past 72 hour(s)).   HEENT: normal Cardio: RRR and  murmur Resp: CTA B/L and unlabored GI: BS positive and NT, ND Extremity:  No Edema Skin:   Intact Neuro: Alert/Oriented, Cranial Nerve II-XII normal, Normal Sensory, 5/5 in BUE and BLE, Abnormal FMC Ataxic/ dec FMC and Other Moderate ataxia Left FInger nose finger, mild ataxia L Heel Shin Musc/Skel:  Other No pain with upper limb or lower limb range of motion, no joint deformities or tenderness General no acute distress   Assessment/Plan: 1. Functional deficits secondary to left cerebellar infarct which require 3+ hours per day of interdisciplinary therapy in a comprehensive inpatient rehab setting. Physiatrist is providing close team supervision and 24 hour management of active medical problems listed below. Physiatrist and rehab team continue to assess barriers to discharge/monitor patient progress toward functional and medical goals. FIM: Function - Bathing Position: Shower Body parts bathed by patient: Right arm, Left arm, Chest, Abdomen, Front perineal area, Buttocks, Right upper leg, Left upper leg, Left lower leg, Right lower leg Assist Level: Set up  Function- Upper Body Dressing/Undressing What is the patient wearing?: Pull over shirt/dress Pull over shirt/dress - Perfomed by patient: Thread/unthread right sleeve, Thread/unthread left sleeve, Put head through opening, Pull shirt over trunk Assist Level: No help, No cues Function - Lower Body Dressing/Undressing What is the patient wearing?: Underwear, Pants, Socks,  Shoes Position: Sitting EOB Underwear - Performed by patient: Thread/unthread right underwear leg, Thread/unthread left underwear leg, Pull underwear up/down Pants- Performed by patient: Thread/unthread right pants leg, Thread/unthread left pants leg, Pull pants up/down Socks - Performed by patient: Don/doff right sock, Don/doff left sock Shoes - Performed by patient: Don/doff right shoe, Don/doff left shoe, Fasten right, Fasten left Assist for footwear: Supervision/touching assist Assist for lower body dressing: Supervision or verbal cues  Function - Toileting Toileting steps completed by patient: Adjust clothing prior to toileting, Performs perineal hygiene, Adjust clothing after toileting Assist level: Set up/obtain supplies  Function - ArchivistToilet Transfers Toilet transfer assistive device: Walker Assist level to toilet: Supervision or verbal cues Assist level from toilet: Supervision or verbal cues  Function - Chair/bed transfer Chair/bed transfer method: Ambulatory Chair/bed transfer assist level: Touching or steadying assistance (Pt > 75%) Chair/bed transfer assistive device: Armrests Chair/bed transfer details: Manual facilitation for weight shifting, Verbal cues for technique, Verbal cues for sequencing  Function - Locomotion: Wheelchair Will patient use wheelchair at discharge?: No Type: Manual Max wheelchair distance: (150 ft) Assist Level: Supervision or verbal cues Assist Level: Supervision or verbal cues Assist Level: Supervision or verbal cues Turns around,maneuvers to table,bed, and toilet,negotiates 3% grade,maneuvers on rugs and over doorsills: No Function - Locomotion: Ambulation Assistive device: No device Max distance: 300 Assist level: Touching or steadying assistance (Pt > 75%) Assist level: Touching or steadying assistance (Pt > 75%) Walk 50 feet with 2 turns activity did not occur: Safety/medical concerns(Limited stability) Assist level: Touching or steadying  assistance (Pt > 75%) Walk 150 feet activity did not occur: Safety/medical concerns(Endurance) Assist level: Touching or steadying assistance (Pt > 75%) Walk 10 feet on uneven surfaces activity  did not occur: Safety/medical concerns(Decreased balance/ataxia)  Function - Comprehension Comprehension: Auditory Comprehension assist level: Follows complex conversation/direction with extra time/assistive device  Function - Expression Expression: Verbal Expression assist level: Expresses complex ideas: With extra time/assistive device  Function - Social Interaction Social Interaction assist level: Interacts appropriately with others with medication or extra time (anti-anxiety, antidepressant).  Function - Problem Solving Problem solving assist level: Solves complex 90% of the time/cues < 10% of the time  Function - Memory Memory assist level: Complete Independence: No helper Patient normally able to recall (first 3 days only): Current season, Location of own room, Staff names and faces, That he or she is in a hospital  Medical Problem List and Plan:  1. Decreased functional ability with dizziness and ataxia secondary to left cerebellar infarction. Other vestibular symptoms including nausea are improving.  -CIR  PT, OT, tent d/c in am      2. DVT Prophylaxis/Anticoagulation: Subcutaneous Lovenox. Monitor for any bleeding episodes. Venous Doppler studies negative , Plavix and aspirin for stroke prophylaxis 3. Pain Management: Tylenol as needed  4. Mood: Provide emotional support  5. Neuropsych: This patient is capable of making decisions on his own behalf.  6. Skin/Wound Care: Routine skin checks  7. Fluids/Electrolytes/Nutrition: Routine in and outs with follow-up chemistries upon admit Recorded fluid intake 1020 mL on 11/18/2017 8. Hyperlipidemia.LDL mildy elevated cont  Lipitor  9. Question alcohol use. Provide counseling, no signs of withdrawal thus far, discussed ETOH effects on balance,  pt states he'll not drink "for a while " at home LOS (Days) 6 A FACE TO FACE EVALUATION WAS PERFORMED  Erick Colace 11/20/2017, 7:24 AM

## 2017-11-20 NOTE — Progress Notes (Signed)
Occupational Therapy Session Note  Patient Details  Name: Elvina SidleDavid G Blow MRN: [id number] Date of Birth: January 02, 1966  Today's Date: 11/20/2017 OT Concurrent Time: 1000-1130 OT Concurrent Time Calculation (min): 90 min   Short Term Goals: Week 1:  OT Short Term Goal 1 (Week 1): N/A d/t ELOS  Skilled Therapeutic Interventions/Progress Updates:    Pt participated in community integration outing to local store.  OT intervention with focus LUE functional use, energy conservation, functional amb with RW, safety awareness, and activity tolerance.  Pt achieved all goals (see Outing sheet in shadow chart). [facility] navigating through congested areas and locating specified items and retrieving from shelving with LUE.  Pt identified possible options for transporting items to cash out. Pt identified community barriers as well as possible places to rest if needed. Pt verbalized understanding of all recommendations  Therapy Documentation Precautions:  Precautions Precautions: Fall Restrictions Weight Bearing Restrictions: No General:   Vital Signs:   Pain:   ADL: ADL ADL Comments: See functional navigator Vision   Perception    Praxis   Exercises:   Other Treatments:    See Function Navigator for Current Functional Status.   Therapy/Group: Individual Therapy  Rich BraveLanier, Xavious Sharrar Chappell 11/20/2017, 12:10 PM

## 2017-11-20 NOTE — Progress Notes (Signed)
Recreational Therapy Session Note  Patient Details  Name: John Mcbride MRN: [id number] Date of Birth: 17-Jul-1965 Today's Date: 11/20/2017 Time:  12-1128 Pain: no c/o Skilled Therapeutic Interventions/Progress Updates: Pt participated in community reintegration/outing to [facility] at overall supervision level using RW.  Goals focused on safe community mobility, identification & negotiation of obstacles, accessing public restroom, energy conservation techniques/education.  See outing goal sheet in shadow chart for full details.   Therapy/Group: [employer]   [name] 11/20/2017, 11:49 AM

## 2017-11-20 NOTE — Discharge Summary (Signed)
NAME: John Mcbride, John G. MEDICAL RECORD [id number] ACCOUNT [id number] DATE OF BIRTH:Aug 14, 1965 FACILITY: [facility] LOCATION: [facility] PHYSICIAN:ANDREW Wynn BankerKIRSTEINS, MD  DISCHARGE SUMMARY  DATE OF DISCHARGE:  11/21/2017  DATE OF ADMISSION:  11/14/2017  DATE OF DISCHARGE:  11/21/2017   DISCHARGE DIAGNOSES: 1.  Left cerebellar infarction. 2.  Subcutaneous Lovenox for deep venous thrombosis prophylaxis. 3.  Hyperlipidemia.   4.  Question alcohol use. 5.   Constipation, resolved.  HISTORY OF PRESENT ILLNESS:  A 52 year old right-handed male with question history of alcohol use on no prescription medication who lives with a roommate.  Independent prior to admission.  Working full time.  Presented 11/12/2017 with nausea and vomiting  as well as dizziness and incoordination.  Cranial CT scan showed suggestion of asymmetrical low attenuation and possible mass effect in the left anterior cerebellum and cerebellar peduncle.  MRI showed a 3.5 cm diameter acute infarction in the superior  cerebellum on the left.  No evidence of mass effect or hemorrhage.  CT angiogram of head and neck with no large vessel occlusion.  The patient did not receive tPA.  Echocardiogram with ejection fraction of 65%, no wall motion abnormalities.  Neurology  consulted.  Maintained on aspirin and Plavix therapy.  Venous Doppler studies negative.  Tolerating a regular diet.  TEE completed showing moderate PFO present as tested by color Doppler.  No evidence of thrombus.  The patient would follow up with cardiology services.  The patient was admitted for comprehensive rehabilitation program.  PAST MEDICAL HISTORY:  See discharge diagnoses.  SOCIAL HISTORY:  Lives with roommates.  Independent prior to admission.  FUNCTIONAL STATUS:  Upon admission to rehab services was handheld assist to ambulate 20 feet, minimal guard supine to sit, min mod assist with activities of daily living.  PHYSICAL EXAMINATION: VITAL SIGNS:  Blood  pressure 119/82, pulse 85, temperature 98, respirations 16. GENERAL:  Alert male in no acute distress.  Mood was flat, but appropriate. HEENT:  EOMs intact. NECK:  Supple, nontender, no JVD. CARDIOVASCULAR:  Rate controlled. ABDOMEN:  Soft, nontender, good bowel sounds. LUNGS:  Clear to auscultation without wheeze.  REHABILITATION HOSPITAL COURSE:  The patient was admitted to inpatient rehabilitation services.  Therapies initiated on a 3-hour daily basis, consisting of physical therapy, occupational therapy and rehabilitation nursing.  The following issues were  addressed during patient's rehabilitation stay.  Pertaining to the patient's left cerebellar infarction, he would remain on aspirin and Plavix therapy.  Follow up neurology services.  Subcutaneous Lovenox for DVT prophylaxis.  No bleeding episodes.   Venous Doppler studies negative.  He remained on Lipitor for hyperlipidemia.  There was some question of possible alcohol use.  He did receive counseling regards to cessation of any alcohol or nicotine products.  The patient received weekly collaborative  interdisciplinary team conferences to discuss estimated length of stay, family teaching, any barriers to his discharge.  Ambulating extended distances without a device, minimal guard for dynamic balance.  Worked on functional balance activities.  Can  ambulate to and from his room without a device and minimal guard.  Gathered belongings for activities of daily living and homemaking.  Again engaged in dynamic standing activities with occupational therapy.  His dizziness had greatly improved.  It was  advised no driving.  He was discharged to home.  DISCHARGE MEDICATIONS:  Included aspirin 81 mg daily, Lipitor 20 mg daily, Plavix 75 mg daily, Tylenol as needed.    DIET:  Regular.    FOLLOWUP:  He would follow up with  Dr. Claudette Mcbride at the outpatient rehab center as directed; Dr. Roda Mcbride of [facility] Neurology service, call for appointment; Dr.  Tonny Mcbride cardiology services, call for appointment to discuss PFO closure.  SPECIAL INSTRUCTIONS:  No driving, no alcohol, no smoking.  TN/NUANCE D:11/20/2017 T:11/20/2017 JOB:[id number]

## 2017-11-20 NOTE — Progress Notes (Signed)
Physical Therapy Session Note  Patient Details  Name: John Mcbride MRN: [id number] Date of Birth: 1965-12-15  Today's Date: 11/20/2017 PT Individual Time: 0800-0840 AND 1300-1400 PT Individual Time Calculation (min): 40 min AND 60 min  Short Term Goals: Week 1:  PT Short Term Goal 1 (Week 1): STG equal LTG due to ELOS  Skilled Therapeutic Interventions/Progress Updates:   Session 1:  Pt finishing toileting w/ NT upon arrival, agreeable to therapy and no c/o pain. Pt completed dressing tasks and donning shoes w/ supervision. Ambulated around unit w/o AD, min guard to close supervision w/ concentration on arm swing for trunk rotation and lateral weight shifting. Worked on being able to walk and talk at the same time for endurance training. Performed NuStep 5 min @ level 5 for endurance training as well and to facilitate trunk rotation. Worked coordination activities in standing emphasizing reciprocal stepping and UE reaching to facilitate trunk rotation and lateral weight shifting. Returned to room and ended session in recliner, call bell within reach and all needs met.   Session 2:  Pt in recliner and agreeable to therapy, no c/o pain. Session focused on functional and dynamic standing balance w/ emphasis on activating ankle, hip, and stepping balance strategies, anticipatory postural control, and trunk rotation. Performed Wii tennis for trunk rotation, 3 min x2 reps. Performed limits of stability training on biodex w/o UE support, 24% to 56% on lowest level, 28% on intermediate level. Worked on balance strategies while on airex pad, emphasis on maintaining balance while reaching outside of BOS. Also performed multiple sit<>stands on airex pad w/o UE support to work on ankle and knee muscular control. Performed alternating stepping from airex pad to 6" step, 3x10 reps. Min guard to close supervision for all activities this session. Ambulated around unit w/ min guard to close supervision w/o AD.  Occasional seated rest break 2/2 fatigue. Returned to room and ended session in recliner, call bell within reach and all needs met.   Therapy Documentation Precautions:  Precautions Precautions: Fall Restrictions Weight Bearing Restrictions: No Vital Signs: Therapy Vitals Temp: 98 F (36.7 C) Temp Source: Oral Pulse Rate: 87 Resp: 16 BP: 98/68 Patient Position (if appropriate): Lying Oxygen Therapy SpO2: 97 % O2 Device: Room Air Pain: Pain Assessment Pain Scale: 0-10 Pain Score: 0-No pain  See Function Navigator for Current Functional Status.   Therapy/Group: Individual Therapy  Jaycee Pelzer K Arnette 11/20/2017, 8:41 AM

## 2017-11-20 NOTE — Discharge Summary (Signed)
Discharge summary job [phone]

## 2017-11-20 NOTE — Progress Notes (Signed)
Social Work Patient ID: John Mcbride, male   DOB: Mar 13, 1966, 52 y.o.   MRN: [id number] Have discussed PCP and pt has no preference have arranged with Dr. Donette LarryHusain. Working on discharge needs.

## 2017-11-20 NOTE — Plan of Care (Signed)
  Problem: Consults Goal: RH STROKE PATIENT EDUCATION Description See Patient Education module for education specifics  Outcome: Progressing   Problem: RH BOWEL ELIMINATION Goal: RH STG MANAGE BOWEL W/MEDICATION W/ASSISTANCE Description STG Manage Bowel with Medication with mod I Assistance.  Outcome: Progressing   Problem: RH SKIN INTEGRITY Goal: RH STG SKIN FREE OF INFECTION/BREAKDOWN Description Patients skin will remain free from infection or breakdown with mod I assist.  Outcome: Progressing   Problem: RH SAFETY Goal: RH STG ADHERE TO SAFETY PRECAUTIONS W/ASSISTANCE/DEVICE Description STG Adhere to Safety Precautions With supervision Assistance/Device.  Outcome: Progressing   Problem: RH KNOWLEDGE DEFICIT Goal: RH STG INCREASE KNOWLEDGE OF HYPERTENSION Description Min assist  Outcome: Progressing Goal: RH STG INCREASE KNOWLEGDE OF HYPERLIPIDEMIA Description Min assist  Outcome: Progressing Goal: RH STG INCREASE KNOWLEDGE OF STROKE PROPHYLAXIS Description Min assist  Outcome: Progressing   

## 2017-11-20 NOTE — Progress Notes (Signed)
Physical Therapy Discharge Summary  Patient Details  Name: John Mcbride MRN: [id number] Date of Birth: 05-Aug-1965  Today's Date: 11/21/2017 PT Individual Time: 0800-0855 PT Individual Time Calculation (min): 55 min   Pt in supine and agreeable to therapy, denies pain. Session focused on functional mobility, endurance and d/c planning. Performed functional mobility as detailed below at modified independent level using RW. Pt w/ good safety awareness throughout session and taking breaks as appropriate 2/2 fatigue. Additionally practiced floor transfer w/ supervision and w/o cues. Performed Berg Balance Scale, scored 39/56, and explained significance of results to pt including importance of AD use. Pt verbalized understanding and in agreement. Performed NuStep 10 min @ level 5 for LE strengthening and endurance training. Educated pt on energy conservation strategies w/ both household and community mobility. Returned to room and ended session in recliner, call bell within reach and all needs met. Pt safe to move about his room at this time w/o assistance when using AD.   Patient has met 11 of 11 long term goals due to improved activity tolerance, improved balance, improved postural control, increased strength, ability to compensate for deficits and improved coordination.  Patient to discharge at an ambulatory level Modified Independent.   Patient's care partner is independent to provide the necessary physical assistance at discharge. Pt is d/c-ing to parents' home and pt is able to direct his care independently.   Reasons goals not met: n/a  Recommendation:  Patient will benefit from ongoing skilled PT services in outpatient setting to continue to advance safe functional mobility, address ongoing impairments in functional balance, endurance, strength, coordination, and quality of gait, and minimize fall risk.  Equipment: RW  Reasons for discharge: treatment goals met and discharge from  hospital  Patient/family agrees with progress made and goals achieved: Yes  PT Discharge Precautions/Restrictions Precautions Precautions: Fall Restrictions Weight Bearing Restrictions: No Vital Signs Therapy Vitals Temp: 98.1 F (36.7 C) Temp Source: Oral Pulse Rate: 90 Resp: 16 BP: 110/69 Patient Position (if appropriate): Lying Oxygen Therapy SpO2: 98 % O2 Device: Room Air Pain Pain Assessment Pain Scale: 0-10 Pain Score: 0-No pain Vision/Perception  Perception Perception: Within Functional Limits Praxis Praxis: Intact  Cognition Overall Cognitive Status: Within Functional Limits for tasks assessed Arousal/Alertness: Awake/alert Orientation Level: Oriented X4 Alternating Attention: Appears intact Memory: Appears intact Awareness: Appears intact Problem Solving: Appears intact Safety/Judgment: Appears intact Sensation Sensation Light Touch: Appears Intact Proprioception: Impaired by gross assessment Coordination Gross Motor Movements are Fluid and Coordinated: No Fine Motor Movements are Fluid and Coordinated: No Coordination and Movement Description: decreased speed, moves slowly and cautiously, although improved overall  Motor  Motor Motor: Within Functional Limits Motor - Discharge Observations: L = R   Mobility Bed Mobility Bed Mobility: Rolling Right;Rolling Left;Supine to Sit;Sit to Supine Rolling Right: Independent Rolling Left: Independent Supine to Sit: Independent Sit to Supine: Independent Transfers Transfers: Sit to Stand;Stand to Sit;Stand Pivot Transfers Sit to Stand: Independent with assistive device Stand to Sit: Independent with assistive device Stand Pivot Transfers: Independent with assistive device Transfer (Assistive device): Rolling walker Locomotion  Gait Ambulation: Yes Gait Assistance: Independent with assistive device Gait Distance (Feet): 150 Feet Assistive device: Rolling walker Gait Gait: Yes Gait Pattern:  Impaired Gait Pattern: Poor foot clearance - left;Poor foot clearance - right;Narrow base of support;Decreased trunk rotation Gait velocity: decreased Stairs / Additional Locomotion Stairs: Yes Stairs Assistance: Independent with assistive device Stair Management Technique: One rail Right Number of Stairs: 12 Height of Stairs: 6 Ramp:  Independent with assistive device Curb: Independent with assistive device Wheelchair Mobility Wheelchair Mobility: No  Trunk/Postural Assessment  Cervical Assessment Cervical Assessment: Within Functional Limits Thoracic Assessment Thoracic Assessment: Within Functional Limits Lumbar Assessment Lumbar Assessment: Within Functional Limits(overall, increased trunk stiffness) Postural Control Postural Control: Within Functional Limits  Balance Balance Balance Assessed: Yes Standardized Balance Assessment Standardized Balance Assessment: Berg Balance Test Berg Balance Test Sit to Stand: Able to stand without using hands and stabilize independently Standing Unsupported: Able to stand 2 minutes with supervision Sitting with Back Unsupported but Feet Supported on Floor or Stool: Able to sit safely and securely 2 minutes Stand to Sit: Sits safely with minimal use of hands Transfers: Able to transfer safely, definite need of hands Standing Unsupported with Eyes Closed: Able to stand 10 seconds with supervision Standing Ubsupported with Feet Together: Able to place feet together independently and stand for 1 minute with supervision From Standing, Reach Forward with Outstretched Arm: Can reach forward >12 cm safely (5") From Standing Position, Pick up Object from Floor: Able to pick up shoe, needs supervision From Standing Position, Turn to Look Behind Over each Shoulder: Turn sideways only but maintains balance Turn 360 Degrees: Able to turn 360 degrees safely but slowly Standing Unsupported, Alternately Place Feet on Step/Stool: Able to complete 4 steps  without aid or supervision Standing Unsupported, One Foot in Front: Able to take small step independently and hold 30 seconds Standing on One Leg: Tries to lift leg/unable to hold 3 seconds but remains standing independently Total Score: 39 Static Sitting Balance Static Sitting - Balance Support: Feet supported;No upper extremity supported Static Sitting - Level of Assistance: 7: Independent Dynamic Sitting Balance Dynamic Sitting - Balance Support: During functional activity;Feet supported Dynamic Sitting - Level of Assistance: 6: Modified independent (Device/Increase time) Static Standing Balance Static Standing - Balance Support: No upper extremity supported;During functional activity Static Standing - Level of Assistance: 6: Modified independent (Device/Increase time) Dynamic Standing Balance Dynamic Standing - Balance Support: Left upper extremity supported;During functional activity Dynamic Standing - Level of Assistance: 6: Modified independent (Device/Increase time) Extremity Assessment  RLE Assessment RLE Assessment: Within Functional Limits LLE Assessment LLE Assessment: Within Functional Limits   See Function Navigator for Current Functional Status.  Tresa Jolley K Arnette 11/21/2017, 8:57 AM

## 2017-11-20 NOTE — Discharge Instructions (Signed)
Inpatient Rehab Discharge Instructions  Elvina SidleDavid G Whitehorn Discharge date and time: No discharge date for patient encounter.   Activities/Precautions/ Functional Status: Activity: activity as tolerated Diet: regular diet Wound Care: none needed Functional status:  ___ No restrictions     ___ Walk up steps independently ___ 24/7 supervision/assistance   ___ Walk up steps with assistance ___ Intermittent supervision/assistance  _x__ Bathe/dress independently ___ Walk with walker     ___ Bathe/dress with assistance ___ Walk Independently    ___ Shower independently ___ Walk with assistance    ___ Shower with assistance ___ No alcohol     ___ Return to work/school ________  Special Instructions:  No driving   COMMUNITY REFERRALS UPON DISCHARGE:    Outpatient: PT & OT  Agency:[facility] [phone]   Date of Last Service:11/21/2017  Appointment Date/Time:AUGUST 27 Tuesday @ 11:00-12:30 PM-OT  AND AUGUST 29 Thursday 8:00-9:00 AM-PT  Medical Equipment/Items Ordered:ROLLING WALKER & TUB BENCH  Agency/Supplier:[facility]   [phone]   GENERAL COMMUNITY RESOURCES FOR PATIENT/FAMILY: Support Groups:[facility] SUPPORT GROUP EVERY SECOND Thursday @ 6:00-7:00 PM ON THE REHAB UNIT QUESTIONS CALL [phone]  STROKE/TIA DISCHARGE INSTRUCTIONS SMOKING Cigarette smoking nearly doubles your risk of having a stroke & is the single most alterable risk factor  If you smoke or have smoked in the last 12 months, you are advised to quit smoking for your health.  Most of the excess cardiovascular risk related to smoking disappears within a year of stopping.  Ask you doctor about anti-smoking medications  [place] Quit Line: 1-800-QUIT NOW  Free Smoking Cessation Classes [phone]  CHOLESTEROL Know your levels; limit fat & cholesterol in your diet  Lipid Panel     Component Value Date/Time   CHOL 139 11/13/2017 0415   TRIG 101 11/13/2017 0415   HDL 34 (L) 11/13/2017  0415   CHOLHDL 4.1 11/13/2017 0415   VLDL 20 11/13/2017 0415   LDLCALC 85 11/13/2017 0415      Many patients benefit from treatment even if their cholesterol is at goal.  Goal: Total Cholesterol (CHOL) less than 160  Goal:  Triglycerides (TRIG) less than 150  Goal:  HDL greater than 40  Goal:  LDL (LDLCALC) less than 100   BLOOD PRESSURE American Stroke Association blood pressure target is less that 120/80 mm/Hg  Your discharge blood pressure is:  BP: 111/85  Monitor your blood pressure  Limit your salt and alcohol intake  Many individuals will require more than one medication for high blood pressure  DIABETES (A1c is a blood sugar average for last 3 months) Goal HGBA1c is under 7% (HBGA1c is blood sugar average for last 3 months)  Diabetes: No known diagnosis of diabetes    Lab Results  Component Value Date   HGBA1C 5.0 11/13/2017     Your HGBA1c can be lowered with medications, healthy diet, and exercise.  Check your blood sugar as directed by your physician  Call your physician if you experience unexplained or low blood sugars.  PHYSICAL ACTIVITY/REHABILITATION Goal is 30 minutes at least 4 days per week  Activity: Increase activity slowly, Therapies: Physical Therapy: Home Health Return to work:   Activity decreases your risk of heart attack and stroke and makes your heart stronger.  It helps control your weight and blood pressure; helps you relax and can improve your mood.  Participate in a regular exercise program.  Talk with your doctor about the best form of exercise for you (dancing, walking, swimming, cycling).  DIET/WEIGHT Goal is to maintain a healthy weight  Your discharge diet is:  Diet Order            Diet regular Room service appropriate? Yes; Fluid consistency: Thin  Diet effective now              liquids Your height is:  Height: 5\' 10"  (177.8 cm) Your current weight is: Weight: 96.6 kg Your Body Mass Index (BMI) is:  BMI (Calculated):  30.56  Following the type of diet specifically designed for you will help prevent another stroke.  Your goal weight range is:    Your goal Body Mass Index (BMI) is 19-24.  Healthy food habits can help reduce 3 risk factors for stroke:  High cholesterol, hypertension, and excess weight.  RESOURCES Stroke/Support Group:  Call [phone]   STROKE EDUCATION PROVIDED/REVIEWED AND GIVEN TO PATIENT Stroke warning signs and symptoms How to activate emergency medical system (call 911). Medications prescribed at discharge. Need for follow-up after discharge. Personal risk factors for stroke. Pneumonia vaccine given:  Flu vaccine given:  My questions have been answered, the writing is legible, and I understand these instructions.  I will adhere to these goals & educational materials that have been provided to me after my discharge from the hospital.     My questions have been answered and I understand these instructions. I will adhere to these goals and the provided educational materials after my discharge from the hospital.  Patient/Caregiver Signature _______________________________ Date __________  Clinician Signature _______________________________________ Date __________  Please bring this form and your medication list with you to all your follow-up doctor's appointments.

## 2017-11-21 ENCOUNTER — Inpatient Hospital Stay (HOSPITAL_COMMUNITY): Payer: BLUE CROSS/BLUE SHIELD

## 2017-11-21 ENCOUNTER — Inpatient Hospital Stay (HOSPITAL_COMMUNITY): Payer: BLUE CROSS/BLUE SHIELD | Admitting: Physical Therapy

## 2017-11-21 LAB — CREATININE, SERUM
Creatinine, Ser: 1.04 mg/dL (ref 0.61–1.24)
GFR calc Af Amer: >60 mL/min (ref >60)

## 2017-11-21 NOTE — Progress Notes (Signed)
Social Work  Discharge Note  The overall goal for the admission was met for:   Discharge location: Yes-GOING TO [place] EVENTUALLY HOME WITH ROOMMATE  Length of Stay: Yes-7 DAYS  Discharge activity level: Yes-MOD/I [place]  Home/community participation: Yes  Services provided included: MD, RD, PT, OT, SLP, RN, CM, TR, Pharmacy and SW  Financial Services: Private Insurance: [employer]  Follow-up services arranged: Outpatient: [facility]=PT & OT 8/27-11:30-12:30 AND 8/29 8:00-9:00 AM, DME: [place] and Patient/Family has no preference for HH/DME agencies  Comments (or additional information):PT PROGRESSED WELL AND WAS READY TO [place]. PLANS TO STAY WITH MOM FOR A SHORT TIME THEN RETURN HOME WITH ROOMMATE. SET UP PCP WITH DR [place] [facility]  Patient/Family verbalized understanding of follow-up arrangements: Yes  Individual responsible for coordination of the follow-up plan: SELF  Confirmed correct DME delivered: Elease Hashimoto 11/21/2017    Elease Hashimoto

## 2017-11-21 NOTE — Progress Notes (Signed)
Pt was discharged after lunch. Last lovenox was given. [name] denied any pain. Family was present. Staff pushed pt in wheelchair to car. Family pushed belongings on a cart. No concerns mentioned before discharge.

## 2017-11-21 NOTE — Plan of Care (Signed)
No changes to report. Pt is currently working with therapy on excerise bike. No signs of distress or pain. Pt verbally denied any pain.

## 2017-11-21 NOTE — Progress Notes (Signed)
Occupational Therapy Session Note  Patient Details  Name: John Mcbride MRN: [id number] Date of Birth: 02/25/1966  Today's Date: 11/21/2017 OT Individual Time: 1000-1058 OT Individual Time Calculation (min): 58 min    Short Term Goals: Week 1:  OT Short Term Goal 1 (Week 1): N/A d/t ELOS  Skilled Therapeutic Interventions/Progress Updates:    OT intervention with focus on increased independence with BADLs/IADLs.  Pt completed bathing at shower, dressing with sit<>stand from EOB, and gathering of supplies/cleaning soiled towels at mod I level.  Pt amb with RW in room to complete all home mgmt tasks.  Pt does not exhibit any unsafe behaviors.  Pt made mod I in room with RW.  Pt pleased with progress and ready for discharge later in day.   Therapy Documentation Precautions:  Precautions Precautions: Fall Restrictions Weight Bearing Restrictions: No   Pain: Pain Assessment Pain Scale: 0-10 Pain Score: 0-No pain  See Function Navigator for Current Functional Status.   Therapy/Group: Individual Therapy  Rich BraveLanier, Francene Mcerlean Chappell 11/21/2017, 11:00 AM

## 2017-11-21 NOTE — Progress Notes (Addendum)
Subjective/Complaints: No new issues overnite ROS - Denies CP,SOB.N/V/D  Objective: Vital Signs: Blood pressure 110/69, pulse 90, temperature 98.1 F (36.7 C), temperature source Oral, resp. rate 16, height _0  (1.778 m), weight 98.6 kg, SpO2 98 %. No results found. Results for orders placed or performed during the hospital encounter of 11/14/17 (from the past 72 hour(s))  Creatinine, serum     Status: None   Collection Time: 11/21/17  5:06 AM  Result Value Ref Range   Creatinine, Ser 1.04 0.61 - 1.24 mg/dL   GFR calc non Af Amer >60 >60 mL/min   GFR calc Af Amer >60 >60 mL/min    Comment: (NOTE) The eGFR has been calculated using the CKD EPI equation. This calculation has not been validated in all clinical situations. eGFR's persistently <60 mL/min signify possible Chronic Kidney Disease. Performed at [facility], [place] [address], [place], [place] [address]      HEENT: normal Cardio: RRR and  murmur Resp: CTA B/L and unlabored GI: BS positive and NT, ND Extremity:  No Edema Skin:   Intact Neuro: Alert/Oriented, Cranial Nerve II-XII normal, Normal Sensory, 5/5 in BUE and BLE, Abnormal FMC Ataxic/ dec FMC and Other Moderate ataxia Left FInger nose finger, mild ataxia L Heel Shin Musc/Skel:  Other No pain with upper limb or lower limb range of motion, no joint deformities or tenderness General no acute distress   Assessment/Plan: 1. Functional deficits secondary to left cerebellar infarct Stable for D/C today F/u PCP in 3-4 weeks F/u PM&R 2 weeks See D/C summary See D/C instructions FIM: Function - Bathing Position: Shower Body parts bathed by patient: Right arm, Left arm, Chest, Abdomen, Front perineal area, Buttocks, Right upper leg, Left upper leg, Left lower leg, Right lower leg Assist Level: Set up  Function- Upper Body Dressing/Undressing What is the patient wearing?: Pull over shirt/dress Pull over shirt/dress - Perfomed by patient: Thread/unthread  right sleeve, Thread/unthread left sleeve, Put head through opening, Pull shirt over trunk Assist Level: No help, No cues Function - Lower Body Dressing/Undressing What is the patient wearing?: Underwear, Pants, Socks, Shoes Position: Sitting EOB Underwear - Performed by patient: Thread/unthread right underwear leg, Thread/unthread left underwear leg, Pull underwear up/down Pants- Performed by patient: Thread/unthread right pants leg, Thread/unthread left pants leg, Pull pants up/down Socks - Performed by patient: Don/doff right sock, Don/doff left sock Shoes - Performed by patient: Don/doff right shoe, Don/doff left shoe, Fasten right, Fasten left Assist for footwear: Supervision/touching assist Assist for lower body dressing: Supervision or verbal cues  Function - Toileting Toileting steps completed by patient: Adjust clothing prior to toileting, Performs perineal hygiene, Adjust clothing after toileting Assist level: Supervision or verbal cues  Function - Air cabin crew transfer assistive device: Walker Assist level to toilet: Supervision or verbal cues Assist level from toilet: Supervision or verbal cues  Function - Chair/bed transfer Chair/bed transfer method: Ambulatory Chair/bed transfer assist level: Touching or steadying assistance (Pt > 75%) Chair/bed transfer assistive device: Armrests Chair/bed transfer details: Manual facilitation for weight shifting, Verbal cues for technique, Verbal cues for sequencing  Function - Locomotion: Wheelchair Will patient use wheelchair at discharge?: No Type: Manual Max wheelchair distance: (150 ft) Assist Level: Supervision or verbal cues Assist Level: Supervision or verbal cues Assist Level: Supervision or verbal cues Turns around,maneuvers to table,bed, and toilet,negotiates 3% grade,maneuvers on rugs and over doorsills: No Function - Locomotion: Ambulation Assistive device: No device Max distance: >150' Assist level:  Touching or steadying  assistance (Pt > 75%) Assist level: Touching or steadying assistance (Pt > 75%) Walk 50 feet with 2 turns activity did not occur: Safety/medical concerns(Limited stability) Assist level: Touching or steadying assistance (Pt > 75%) Walk 150 feet activity did not occur: Safety/medical concerns(Endurance) Assist level: Touching or steadying assistance (Pt > 75%) Walk 10 feet on uneven surfaces activity did not occur: Safety/medical concerns(Decreased balance/ataxia)  Function - Comprehension Comprehension: Auditory Comprehension assist level: Follows complex conversation/direction with extra time/assistive device  Function - Expression Expression: Verbal Expression assist level: Expresses complex ideas: With extra time/assistive device  Function - Social Interaction Social Interaction assist level: Interacts appropriately with others with medication or extra time (anti-anxiety, antidepressant).  Function - Problem Solving Problem solving assist level: Solves complex 90% of the time/cues < 10% of the time  Function - Memory Memory assist level: Complete Independence: No helper Patient normally able to recall (first 3 days only): Current season, Location of own room, Staff names and faces, That he or she is in a hospital  Medical Problem List and Plan:  1. Decreased functional ability with dizziness and ataxia secondary to left cerebellar infarction. Other vestibular symptoms including nausea are improving.  -CIR  PT, OT, tent d/c today    2. DVT Prophylaxis/Anticoagulation: Subcutaneous Lovenox. Monitor for any bleeding episodes. Venous Doppler studies negative , Plavix and aspirin for stroke prophylaxis 3. Pain Management: Tylenol as needed  4. Mood: Provide emotional support  5. Neuropsych: This patient is capable of making decisions on his own behalf.  6. Skin/Wound Care: Routine skin checks  7. Fluids/Electrolytes/Nutrition: Routine in and outs with follow-up  chemistries upon admit Recorded fluid intake 720 mL on 11/20/2017 8. Hyperlipidemia.LDL mildy elevated cont  Lipitor  9. Question alcohol use. Provide counseling, no signs of withdrawal thus far, discussed ETOH effects on balance, pt states he'll not drink "for a while " at home LOS (Days) 7 A FACE TO FACE EVALUATION WAS PERFORMED  Charlett Blake 11/21/2017, 7:21 AM

## 2017-11-21 NOTE — Progress Notes (Signed)
Occupational Therapy Discharge Summary  Patient Details  Name: John Mcbride MRN: [id number] Date of Birth: 1965-12-24   Patient has met 10 of 10 long term goals due to improved activity tolerance, improved balance, postural control, ability to compensate for deficits, functional use of  RIGHT upper, RIGHT lower, LEFT upper and LEFT lower extremity, improved attention, improved awareness and improved coordination.  Pt made excellent progress durng this admission.  Pt is mod I for all BADLs (bathing/dressing/toileting) and IADLs (home mgmt, simple meal prep). Pt to discharge to parent's home for approx one week before returning home with room mate. Pt uses his LUE in all functional tasks but continues to exhibit slight dysmetria with open chain functions. Patient to discharge at overall Modified Independent level.  Patient's care partner is independent to provide the necessary physical and cognitive assistance at discharge.    Recommendation:  Patient will benefit from ongoing skilled OT services in outpatient setting to continue to advance functional skills in the area of BADL and iADL.  Equipment: tub transfer bench  Reasons for discharge: treatment goals met and discharge from hospital  Patient/family agrees with progress made and goals achieved: Yes  OT Discharge Vision Baseline Vision/History: No visual deficits Patient Visual Report: No change from baseline Vision Assessment?: No apparent visual deficits Perception  Perception: Within Functional Limits Praxis Praxis: Intact Cognition Overall Cognitive Status: Within Functional Limits for tasks assessed Arousal/Alertness: Awake/alert Orientation Level: Oriented X4 Attention: Alternating Alternating Attention: Appears intact Memory: Appears intact Awareness: Appears intact Problem Solving: Appears intact Safety/Judgment: Appears intact Sensation Sensation Light Touch: Appears Intact Hot/Cold: Appears  Intact Proprioception: Impaired by gross assessment Coordination Gross Motor Movements are Fluid and Coordinated: No Fine Motor Movements are Fluid and Coordinated: No Coordination and Movement Description: decreased speed, moves slowly and cautiously, although improved overall  Finger Nose Finger Test: slow with slight dysmetria Motor  Motor Motor: Within Functional Limits Motor - Discharge Observations: L = R  Mobility  Bed Mobility Bed Mobility: Rolling Right;Rolling Left;Supine to Sit;Sit to Supine Rolling Right: Independent Rolling Left: Independent Supine to Sit: Independent Sit to Supine: Independent Transfers Sit to Stand: Independent with assistive device Stand to Sit: Independent with assistive device  Trunk/Postural Assessment  Cervical Assessment Cervical Assessment: Within Functional Limits Thoracic Assessment Thoracic Assessment: Within Functional Limits Lumbar Assessment Lumbar Assessment: Within Functional Limits Postural Control Postural Control: Within Functional Limits  Balance Balance Balance Assessed: Yes Standardized Balance Assessment Standardized Balance Assessment: Berg Balance Test Berg Balance Test Sit to Stand: Able to stand without using hands and stabilize independently Standing Unsupported: Able to stand 2 minutes with supervision Sitting with Back Unsupported but Feet Supported on Floor or Stool: Able to sit safely and securely 2 minutes Stand to Sit: Sits safely with minimal use of hands Transfers: Able to transfer safely, definite need of hands Standing Unsupported with Eyes Closed: Able to stand 10 seconds with supervision Standing Ubsupported with Feet Together: Able to place feet together independently and stand for 1 minute with supervision From Standing, Reach Forward with Outstretched Arm: Can reach forward >12 cm safely (5") From Standing Position, Pick up Object from Floor: Able to pick up shoe, needs supervision From Standing  Position, Turn to Look Behind Over each Shoulder: Turn sideways only but maintains balance Turn 360 Degrees: Able to turn 360 degrees safely but slowly Standing Unsupported, Alternately Place Feet on Step/Stool: Able to complete 4 steps without aid or supervision Standing Unsupported, One Foot in Front: Able to take small  step independently and hold 30 seconds Standing on One Leg: Tries to lift leg/unable to hold 3 seconds but remains standing independently Total Score: 39 Static Sitting Balance Static Sitting - Balance Support: Feet supported;No upper extremity supported Static Sitting - Level of Assistance: 7: Independent Dynamic Sitting Balance Dynamic Sitting - Balance Support: During functional activity;Feet supported Dynamic Sitting - Level of Assistance: 6: Modified independent (Device/Increase time) Static Standing Balance Static Standing - Balance Support: No upper extremity supported;During functional activity Static Standing - Level of Assistance: 6: Modified independent (Device/Increase time) Dynamic Standing Balance Dynamic Standing - Balance Support: Left upper extremity supported;During functional activity Dynamic Standing - Level of Assistance: 6: Modified independent (Device/Increase time) Extremity/Trunk Assessment RUE Assessment RUE Assessment: Within Functional Limits LUE Assessment LUE Assessment: Exceptions to [facility] Passive Range of Motion (PROM) Comments: WFL Active Range of Motion (AROM) Comments: WFL General Strength Comments: slight dysmetria LUE Body System: Neuro Brunstrum levels for arm and hand: Arm;Hand Brunstrum level for arm: Stage V Relative Independence from Synergy Brunstrum level for hand: Stage VI Isolated joint movements   See Function Navigator for Current Functional Status.  Leotis Shames [facility] 11/21/2017, 10:54 AM

## 2017-11-24 ENCOUNTER — Ambulatory Visit (INDEPENDENT_AMBULATORY_CARE_PROVIDER_SITE_OTHER): Payer: BLUE CROSS/BLUE SHIELD | Admitting: Cardiovascular Disease

## 2017-11-24 ENCOUNTER — Encounter: Payer: Self-pay | Admitting: Cardiovascular Disease

## 2017-11-24 VITALS — BP 112/82 | HR 83 | Ht 70.0 in | Wt 219.4 lb

## 2017-11-24 DIAGNOSIS — Q211 Atrial septal defect: Secondary | ICD-10-CM | POA: Diagnosis not present

## 2017-11-24 DIAGNOSIS — Q2112 Patent foramen ovale: Secondary | ICD-10-CM

## 2017-11-24 NOTE — H&P (View-Only) (Signed)
Cardiology Office Note Date:  11/24/2017   ID:  John SidleDavid G Rudin, DOB [birth year], MRN [id number]  PCP:  Patient, No Pcp Per  Cardiologist:  Tonny BollmanMichael Shariff Lasky, MD    Chief Complaint  Patient presents with  . PFO evaluation     History of Present Illness: John Mcbride is a 52 y.o. male who presents for evaluation of PFO, referred by Dr Roda ShuttersXu.  He is here alone today. He presented with severe vertigo, nausea, vomiting, and lethargy on 11-12-2017 and was diagnosed with an acute cerebellar stroke. He was treated conservatively and [facility]. The patient has no prior history of stroke or heart attack. He denies chest pain or shortness of breath. He's had mild edema over the years, but no orthopnea or PND. He was drinking alcohol fairly regularly at the time of his stroke but has subsequently quit. No tobacco. No hx of HTN or diabetes.   Stroke evaluation has included vascular US studies, CT and MRA of the head, and transthoracic and transesophageal echo studies. Studies are pertinent for a PFO and lack of other significant findings. He has a roommate but is currently living with his parents while he is recovering from the stroke. He uses a walker for ambulation today. He starts outpatient physical therapy this week. The patient works at [employer] and is out on short-term disability while he is recovering from his stroke.   Past Medical History:  Diagnosis Date  . Heart murmur     Past Surgical History:  Procedure Laterality Date  . TEE WITHOUT CARDIOVERSION N/A 11/14/2017   Procedure: TRANSESOPHAGEAL ECHOCARDIOGRAM (TEE);  Surgeon: Pricilla Riffleoss, Paula V, MD;  [facility];  Service: Cardiovascular;  Laterality: N/A;  bubble study    Current Outpatient Medications  Medication Sig Dispense Refill  . acetaminophen (TYLENOL) 325 MG tablet Take 2 tablets (650 mg total) by mouth every 4 (four) hours as needed for mild pain (or temp > 37.5 C (99.5 F)).    Marland Kitchen. aspirin EC 81  MG EC tablet Take 1 tablet (81 mg total) by mouth daily. 30 tablet 0  . atorvastatin (LIPITOR) 20 MG tablet Take 1 tablet (20 mg total) by mouth daily at 6 PM. 30 tablet 0  . clopidogrel (PLAVIX) 75 MG tablet Take 1 tablet (75 mg total) by mouth daily. 30 tablet 0   No current facility-administered medications for this visit.     Allergies:   Patient has no known allergies.   Social History:  The patient  reports that he has never smoked. He has never used smokeless tobacco. He reports that he drinks about 10.0 standard drinks of alcohol per week. He reports that he does not use drugs.   Family History:  The patient's  family history includes Cancer in his father and mother; Heart disease in his father.    ROS:  Please see the history of present illness.  Otherwise, review of systems is positive for dizziness, excessive sweating, nausea.  All other systems are reviewed and negative.    PHYSICAL EXAM: VS:  BP 112/82   Pulse 83   Ht 5\' 10"  (1.778 m)   Wt 219 lb 6.4 oz (99.5 kg)   SpO2 96%   BMI 31.48 kg/m  , BMI Body mass index is 31.48 kg/m. GEN: Well nourished, well developed, in no acute distress  HEENT: normal  Neck: no JVD, no masses. No carotid bruits Cardiac: RRR without murmur or gallop  Respiratory:  clear to auscultation bilaterally, normal work of breathing GI: soft, nontender, nondistended, + BS MS: no deformity or atrophy  Ext: no pretibial edema, pedal pulses 2+= bilaterally Skin: warm and dry, no rash Neuro:  Strength and sensation are intact Psych: euthymic mood, full affect  EKG:  EKG is not ordered today.  Recent Labs: 11/13/2017: Magnesium 2.3 11/17/2017: ALT 40; BUN 11; Hemoglobin 16.4; Platelets 256; Potassium 4.2; Sodium 141 11/21/2017: Creatinine, Ser 1.04   Lipid Panel     Component Value Date/Time   CHOL 139 11/13/2017 0415   TRIG 101 11/13/2017 0415   HDL 34 (L) 11/13/2017 0415   CHOLHDL 4.1 11/13/2017 0415   VLDL 20 11/13/2017  0415   LDLCALC 85 11/13/2017 0415      Wt Readings from Last 3 Encounters:  11/24/17 219 lb 6.4 oz (99.5 kg)  11/20/17 217 lb 6 oz (98.6 kg)  11/14/17 231 lb 14.8 oz (105.2 kg)     Cardiac Studies Reviewed: TEE: Left ventricle:  LVEF is normal  ------------------------------------------------------------------- Aortic valve:  AV is normal No AI.  ------------------------------------------------------------------- Aorta:  The aorta was normal, not dilated, and non-diseased.  ------------------------------------------------------------------- Mitral valve:  Trace MR. MV is normal.  ------------------------------------------------------------------- Left atrium:   No evidence of thrombus in the atrial cavity or appendage.  No evidence of thrombus in the atrial cavity or appendage.  ------------------------------------------------------------------- Atrial septum:  MOderate PFO present as tested by color doppelr and with injection of saline intravenously with appearance of bubbles in left sided chambers  ------------------------------------------------------------------- Right ventricle:  RVEF is normal.  ------------------------------------------------------------------- Tricuspid valve:  MIld prolapse of the TV Mild TR.   ------------------------------------------------------------------- Post procedure conclusions Ascending Aorta:  - The aorta was normal, not dilated, and non-diseased.  MRI Brain: IMPRESSION: 3.5 cm in diameter acute infarction in the superior cerebellum on the left. No evidence of significant swelling/mass effect or of any hemorrhage.  Mild chronic small-vessel ischemic change of the cerebral hemispheric white matter.  No large vessel vascular occlusion identified on this noncontrast Study.  BL Venous US: Final Interpretation: Right: There is no evidence of deep vein thrombosis in the lower extremity. Left: There is no evidence  of deep vein thrombosis in the lower extremity.  ASSESSMENT AND PLAN: PFO with cryptogenic stroke. Radiographic results, hospital notes, lab results, and cardiac imaging data are reviewed. TEE images are reviewed and demonstrate a moderate size PFO with spontaneous R--->L flow demonstrated by color doppler and agitated saline contrast.   The patient's Rope Score is 7, indicating a moderate/high probability that the patient's stroke is 'PFO-related.'  The patient is counseled about the association of PFO and cryptogenic stroke. Available clinical trial data is reviewed, specifically those trials comparing transcatheter PFO closure and medical therapy with antiplatelet drugs. The patient understands the potential benefit of PFO closure with respect to secondary stroke reduction compared with medical therapy alone. Specific risks of transcatheter PFO closure are reviewed with the patient. These risks include bleeding, infection, device embolization, stroke, cardiac perforation, tamponade, arrhythmia, MI, and late device erosion. He understands these serious risks occur at low incidence of < 1%.   The device is demonstrated to the patient today.  We discussed typical recovery from the procedure.  He understands the need to continue on dual antiplatelet therapy for a period of 3 months following PFO closure.  He also understands the need to follow SBE prophylaxis for a period of 6 months.  Current medicines are reviewed with the patient today.  The patient does not have concerns regarding medicines.  Labs/ tests ordered today include:  No orders of the defined types were placed in this encounter.   Disposition:   As above  Signed, [name], MD  11/24/2017 4:18 PM    [facility] [address] [place], [place], [place]  [address] Phone: [phone]; Fax: [phone]

## 2017-11-24 NOTE — Patient Instructions (Addendum)
Medication Instructions:  Your provider recommends that you continue on your current medications as directed. Please refer to the Current Medication list given to you today.    Labwork: None needed (drawn 8/19)  Testing/Procedures: Dr. Excell Seltzerooper recommends you have a PFO CLOSURE.  Follow-Up: You have an appointment on 01/08/2018 at 1:30PM.  Any Other Special Instructions Will Be Listed Below (If Applicable).    [facility] [address] OFF[address], [address] [place] [address] Dept: [phone] Loc: [phone]  John SidleDavid G Mcbride  11/24/2017  You are scheduled for a PFO CLOSURE on Thursday, September 5 with Dr. Tonny BollmanMichael Cooper.  1. [facility] at [facility]: [address] [place], [place] [address] by 10:00 AM (This time is two hours before your procedure to ensure your preparation). Free valet parking service is available.   Special note: Every effort is made to have your procedure done on time. Please understand that emergencies sometimes delay scheduled procedures.  2. Diet: Do not eat solid foods after midnight.  The patient may have clear liquids until 5am upon the day of the procedure.  3. Labs: None needed (drawn 8/19).  4. Medication instructions in preparation for your procedure:  1) MAKE SURE TO TAKE ASPIRIN AND PLAVIX the morning of your procedure.  2) you my take your other meds as directed with sips of water.  5. Plan for one night stay--bring personal belongings. 6. Bring a current list of your medications and current insurance cards. 7. You MUST have a responsible person to drive you home. 8. Someone MUST be with you the first 24 hours after you arrive home or your discharge will be delayed. 9. Please wear clothes that are easy to get on and off and wear slip-on shoes.  Thank you for allowing us to care for you!   -- [facility]

## 2017-11-24 NOTE — Progress Notes (Signed)
 Cardiology Office Note Date:  11/24/2017   ID:  John Mcbride, DOB 06/15/1965, MRN [id number]  PCP:  Patient, No Pcp Per  Cardiologist:  Tiane Szydlowski, MD    Chief Complaint  Patient presents with  . PFO evaluation     History of Present Illness: John Mcbride is a 52 y.o. male who presents for evaluation of PFO, referred by Dr Xu.  He is here alone today. He presented with severe vertigo, nausea, vomiting, and lethargy on 11-12-2017 and was diagnosed with an acute cerebellar stroke. He was treated conservatively and required [facility] inpatient rehabilitation. The patient has no prior history of stroke or heart attack. He denies chest pain or shortness of breath. He's had mild edema over the years, but no orthopnea or PND. He was drinking alcohol fairly regularly at the time of his stroke but has subsequently quit. No tobacco. No hx of HTN or diabetes.   Stroke evaluation has included vascular US studies, CT and MRA of the head, and transthoracic and transesophageal echo studies. Studies are pertinent for a PFO and lack of other significant findings. He has a roommate but is currently living with his parents while he is recovering from the stroke. He uses a walker for ambulation today. He starts outpatient physical therapy this week. The patient works at [employer] and is out on short-term disability while he is recovering from his stroke.   Past Medical History:  Diagnosis Date  . Heart murmur     Past Surgical History:  Procedure Laterality Date  . TEE WITHOUT CARDIOVERSION N/A 11/14/2017   Procedure: TRANSESOPHAGEAL ECHOCARDIOGRAM (TEE);  Surgeon: Ross, Paula V, MD;  Location: [facility] ENDOSCOPY;  Service: Cardiovascular;  Laterality: N/A;  bubble study    Current Outpatient Medications  Medication Sig Dispense Refill  . acetaminophen (TYLENOL) 325 MG tablet Take 2 tablets (650 mg total) by mouth every 4 (four) hours as needed for mild pain (or temp > 37.5 C (99.5 F)).    . aspirin EC 81  MG EC tablet Take 1 tablet (81 mg total) by mouth daily. 30 tablet 0  . atorvastatin (LIPITOR) 20 MG tablet Take 1 tablet (20 mg total) by mouth daily at 6 PM. 30 tablet 0  . clopidogrel (PLAVIX) 75 MG tablet Take 1 tablet (75 mg total) by mouth daily. 30 tablet 0   No current facility-administered medications for this visit.     Allergies:   Patient has no known allergies.   Social History:  The patient  reports that he has never smoked. He has never used smokeless tobacco. He reports that he drinks about 10.0 standard drinks of alcohol per week. He reports that he does not use drugs.   Family History:  The patient's  family history includes Cancer in his father and mother; Heart disease in his father.    ROS:  Please see the history of present illness.  Otherwise, review of systems is positive for dizziness, excessive sweating, nausea.  All other systems are reviewed and negative.    PHYSICAL EXAM: VS:  BP 112/82   Pulse 83   Ht 5' 10" (1.778 m)   Wt 219 lb 6.4 oz (99.5 kg)   SpO2 96%   BMI 31.48 kg/m  , BMI Body mass index is 31.48 kg/m. GEN: Well nourished, well developed, in no acute distress  HEENT: normal  Neck: no JVD, no masses. No carotid bruits Cardiac: RRR without murmur or gallop                  Respiratory:  clear to auscultation bilaterally, normal work of breathing GI: soft, nontender, nondistended, + BS MS: no deformity or atrophy  Ext: no pretibial edema, pedal pulses 2+= bilaterally Skin: warm and dry, no rash Neuro:  Strength and sensation are intact Psych: euthymic mood, full affect  EKG:  EKG is not ordered today.  Recent Labs: 11/13/2017: Magnesium 2.3 11/17/2017: ALT 40; BUN 11; Hemoglobin 16.4; Platelets 256; Potassium 4.2; Sodium 141 11/21/2017: Creatinine, Ser 1.04   Lipid Panel     Component Value Date/Time   CHOL 139 11/13/2017 0415   TRIG 101 11/13/2017 0415   HDL 34 (L) 11/13/2017 0415   CHOLHDL 4.1 11/13/2017 0415   VLDL 20 11/13/2017  0415   LDLCALC 85 11/13/2017 0415      Wt Readings from Last 3 Encounters:  11/24/17 219 lb 6.4 oz (99.5 kg)  11/20/17 217 lb 6 oz (98.6 kg)  11/14/17 231 lb 14.8 oz (105.2 kg)     Cardiac Studies Reviewed: TEE: Left ventricle:  LVEF is normal  ------------------------------------------------------------------- Aortic valve:  AV is normal No AI.  ------------------------------------------------------------------- Aorta:  The aorta was normal, not dilated, and non-diseased.  ------------------------------------------------------------------- Mitral valve:  Trace MR. MV is normal.  ------------------------------------------------------------------- Left atrium:   No evidence of thrombus in the atrial cavity or appendage.  No evidence of thrombus in the atrial cavity or appendage.  ------------------------------------------------------------------- Atrial septum:  MOderate PFO present as tested by color doppelr and with injection of saline intravenously with appearance of bubbles in left sided chambers  ------------------------------------------------------------------- Right ventricle:  RVEF is normal.  ------------------------------------------------------------------- Tricuspid valve:  MIld prolapse of the TV Mild TR.   ------------------------------------------------------------------- Post procedure conclusions Ascending Aorta:  - The aorta was normal, not dilated, and non-diseased.  MRI Brain: IMPRESSION: 3.5 cm in diameter acute infarction in the superior cerebellum on the left. No evidence of significant swelling/mass effect or of any hemorrhage.  Mild chronic small-vessel ischemic change of the cerebral hemispheric white matter.  No large vessel vascular occlusion identified on this noncontrast Study.  BL Venous US: Final Interpretation: Right: There is no evidence of deep vein thrombosis in the lower extremity. Left: There is no evidence  of deep vein thrombosis in the lower extremity.  ASSESSMENT AND PLAN: PFO with cryptogenic stroke. Radiographic results, hospital notes, lab results, and cardiac imaging data are reviewed. TEE images are reviewed and demonstrate a moderate size PFO with spontaneous R--->L flow demonstrated by color doppler and agitated saline contrast.   The patient's Rope Score is 7, indicating a moderate/high probability that the patient's stroke is 'PFO-related.'  The patient is counseled about the association of PFO and cryptogenic stroke. Available clinical trial data is reviewed, specifically those trials comparing transcatheter PFO closure and medical therapy with antiplatelet drugs. The patient understands the potential benefit of PFO closure with respect to secondary stroke reduction compared with medical therapy alone. Specific risks of transcatheter PFO closure are reviewed with the patient. These risks include bleeding, infection, device embolization, stroke, cardiac perforation, tamponade, arrhythmia, MI, and late device erosion. He understands these serious risks occur at low incidence of < 1%.   The device is demonstrated to the patient today.  We discussed typical recovery from the procedure.  He understands the need to continue on dual antiplatelet therapy for a period of 3 months following PFO closure.  He also understands the need to follow SBE prophylaxis for a period of 6 months.  Current medicines are reviewed with the patient today.    The patient does not have concerns regarding medicines.  Labs/ tests ordered today include:  No orders of the defined types were placed in this encounter.   Disposition:   As above  Signed, Kaspar Albornoz, MD  11/24/2017 4:18 PM    [facility] [address], [place], [place]  [address] Phone: [phone]; Fax: [phone]  

## 2017-11-25 ENCOUNTER — Ambulatory Visit: Payer: BLUE CROSS/BLUE SHIELD | Attending: Physical Medicine & Rehabilitation | Admitting: Occupational Therapy

## 2017-11-25 ENCOUNTER — Encounter: Payer: Self-pay | Admitting: Occupational Therapy

## 2017-11-25 DIAGNOSIS — M6281 Muscle weakness (generalized): Secondary | ICD-10-CM | POA: Diagnosis not present

## 2017-11-25 DIAGNOSIS — R278 Other lack of coordination: Secondary | ICD-10-CM | POA: Insufficient documentation

## 2017-11-25 DIAGNOSIS — R270 Ataxia, unspecified: Secondary | ICD-10-CM | POA: Insufficient documentation

## 2017-11-25 DIAGNOSIS — R41842 Visuospatial deficit: Secondary | ICD-10-CM | POA: Diagnosis not present

## 2017-11-25 DIAGNOSIS — R2681 Unsteadiness on feet: Secondary | ICD-10-CM | POA: Diagnosis not present

## 2017-11-25 DIAGNOSIS — R2689 Other abnormalities of gait and mobility: Secondary | ICD-10-CM | POA: Insufficient documentation

## 2017-11-25 NOTE — Therapy (Signed)
[facility] [address] [place], [place], [address] Phone: [phone]   Fax:  [phone]  Occupational Therapy Evaluation  Patient Details  Name: John Mcbride MRN: [id number] Date of Birth: 07-07-1965 Referring Provider: Dr. Wynn Banker   Encounter Date: 11/25/2017  OT End of Session - 11/25/17 1625    Visit Number  1    Number of Visits  10    Date for OT Re-Evaluation  12/30/17    Authorization Type  [employer] - pt has 30 visits combined for PT and OT. Pt wishes to use 20 visits for PT and 10 visits for OT    OT Start Time  1148    OT Stop Time  1232    OT Time Calculation (min)  44 min    Activity Tolerance  Patient tolerated treatment well       Past Medical History:  Diagnosis Date  . Heart murmur     Past Surgical History:  Procedure Laterality Date  . TEE WITHOUT CARDIOVERSION N/A 11/14/2017   Procedure: TRANSESOPHAGEAL ECHOCARDIOGRAM (TEE);  Surgeon: Pricilla Riffle, MD;  Location: [facility] ENDOSCOPY;  Service: Cardiovascular;  Laterality: N/A;  bubble study    There were no vitals filed for this visit.  Subjective Assessment - 11/25/17 1157    Pertinent History  Pt with cerebellar CVA with ataxia on 11/12/2017.  Discharged home 11/21/2017 after inpt rehab stay.  PMH: HLD, possible alcohol use, Heart murmur    Patient Stated Goals  Get to where I can walk a little better. Staying at my parents right now.    Currently in Pain?  No/denies        [facility] OT Assessment - 11/25/17 0001      Assessment   Medical Diagnosis  Cerebellar CVA with ataxia    Referring Provider  Dr. Wynn Banker    Onset Date/Surgical Date  11/12/17    Hand Dominance  Right    Prior Therapy  inpt rehab OT, PT and ST      Precautions   Precautions  Fall      Restrictions   Weight Bearing Restrictions  No      Balance Screen   Has the patient fallen in the past 6 months  No   Pt has PT eval on 11/27/2017     Home  Environment   Family/patient expects to be discharged to:  Private residence    Living Arrangements  Non-relatives/Friends    Available Help at Discharge  Available PRN/intermittently   has a room mate currently living with parents   Type of Home  House    Home Access  Stairs   3 stairs with railing on R as you ascend   Home Layout  One level    Advertising account planner    Additional Comments  Pt has RW and transfer tub bench that pt is using at his parents home. Pt currently staying with his parents but wishes as some point to return to his own home.      Prior Function   Level of Independence  Independent    Vocation  Full time employment    Vocation Requirements  works for [employer].  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  Pt states he can go back at light duty which would be very minimal physical demand.    Leisure  [employer], ride 4 wheelers, listen to  music, [place]      ADL   Eating/Feeding  Modified independent   harder to control L hand   Grooming  Independent    Upper Body Bathing  Independent    Lower Body Bathing  Modified independent   stands for brief periods   Upper Body Dressing  Increased time    Lower Body Dressing  Increased time   for tying, buttoning, zipping   Toilet Transfer  Modified independent    Toileting - Clothing Manipulation  Independent    Toileting -  Geneticist, molecular  Modified independent      IADL   Shopping  Assistance for transportation    Light Housekeeping  Does not participate in any housekeeping tasks    Meal Prep  Able to complete simple cold meal and snack prep   pt did not really cook before   [employer] on family or friends for transportation    Medication Management  Is responsible for taking medication in correct dosages at correct time    Physicist, medical financial matters independently (budgets, writes  checks, pays rent, bills goes to bank), collects and keeps track of income      Mobility   Mobility Status  Needs assist   on hills or outdoor surfaces     Written Expression   Dominant Hand  Right      Vision - History   Baseline Vision  No visual deficits    Additional Comments  Pt reports some diplopia at first but resolved. Pt loses his balance with head movement and reports some gaze stabilization with head turns.       Activity Tolerance   Activity Tolerance  Tolerate 30+ min activity without fatigue    Activity Tolerance Comments  Pt feels his "pretty well" this is but does notice weakness in the LLE if he walks a longer distance.       Cognition   Overall Cognitive Status  Within Functional Limits for tasks assessed      Sensation   Light Touch  Appears Intact    Hot/Cold  Appears Intact    Proprioception  Appears Intact      Coordination   Gross Motor Movements are Fluid and Coordinated  No   mild ataxia in LUE   Finger Nose Finger Test  moderate impairment (ataxia) - Pt reports it is slightly better.    9 Hole Peg Test  Left    Left 9 Hole Peg Test  51.30    Box and Blocks  R= 28      Tone   Assessment Location  Left Upper Extremity      ROM / Strength   AROM / PROM / Strength  AROM;Strength      AROM   Overall AROM   Within functional limits for tasks performed    Overall AROM Comments  WFL's      Strength   Overall Strength  Deficits    Overall Strength Comments  Deficit in L grip strength only see below.       Hand Function   Right Hand Gross Grasp  Functional    Right Hand Grip (lbs)  105    Left Hand Gross Grasp  Impaired   relative to R hand   Left Hand Grip (lbs)  85      LUE Tone   LUE Tone  Within Functional Limits  OT Short Term Goals - 11/25/17 1314      OT SHORT TERM GOAL #1   Title  Pt will be mod I with HEP (LUE coordination, grip strength, balance) - 12/23/2017    Status  New      OT SHORT  TERM GOAL #2   Title  Pt will demonstrate improved fine motor coordination for LUE evidenced by decreasing time on 9 hole peg by 10 seconds (baseline = 51.30) to assist with self care tasks    Status  New      OT SHORT TERM GOAL #3   Title  Pt will demonstrate improved grip strength by at least 5 pounds to assist with picking up heavy objects in prep to return to work    Status  New      OT SHORT TERM GOAL #4   Title  Pt will demonstrate ability to do bilateral overhead tasks in standing with normal BOS with close supervision (i.e putting away dishes on top shelf of cabinet)    Status  New      OT SHORT TERM GOAL #5   Title  Further assess visual vestibular integration and set goal prn    Status  New      OT SHORT TERM GOAL #6   Title  Pt will be able to pick up light item from low shelf with no more than supervision    Status  New        OT Long Term Goals - 11/25/17 1605      OT LONG TERM GOAL #1   Title  Pt will be mod I with upgraded HEP - 12/30/2017 (pt has 30 visits combined for PT and OT and wishes to use 10 visits for OT    Status  New      OT LONG TERM GOAL #2   Title  Pt will demonstrate improved coordination LUE as evidenced by decreasing time on 9 hole peg by at least 13 seconds 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     Status  New            Plan - 11/25/17 1618    Clinical Impression Statement  Pt is a 52 year old male s/p R cerebellar CVA on 11/12/2017. Pt was discharged home to his parents on 11/21/2017 after brief stay in inpt rehab.  Pt presents today with  the following impairments that impact his independence and return to life roles:  ataxia, decreased balance, decreased visual vestibular integration, decreased grip strength LUE, decreased coordination LUE, decreased functional use of LUE. Pt will benefit from skilled OT to address these deficits and maximize independence.      Occupational Profile and client history currently impacting functional performance  son, friend, employee, father. PMH: HLD, possible alcohol use, heart murmur    Occupational performance deficits (Please refer to evaluation for details):  ADL's;IADL's;Work;Leisure;Social Participation    Rehab Potential  Good  OT Frequency  2x / week    OT Duration  Other (comment)   5 weeks -pt has 30 visits combined for PT and OT   OT Treatment/Interventions  Self-care/ADL training;Aquatic Therapy;DME and/or AE instruction;Neuromuscular education;Therapeutic exercise;Building services engineerunctional Mobility Training;Therapeutic activities;Balance training;Patient/family education    Plan  Initiate HEP for coordination and grip strength, NMR for balance, functional mobility, LUE control    Clinical Decision Making  Several treatment options, min-mod task modification necessary    Consulted and Agree with Plan of Care  Patient       Patient will benefit from skilled therapeutic intervention in order to improve the following deficits and impairments:  Abnormal gait, Decreased balance, Decreased coordination, Decreased knowledge of use of DME, Decreased mobility, Difficulty walking, Decreased strength, Impaired UE functional use  Visit Diagnosis: Ataxia - Plan: Ot plan of care cert/re-cert  Other lack of coordination - Plan: Ot plan of care cert/re-cert  Unsteadiness on feet - Plan: Ot plan of care cert/re-cert  Muscle weakness (generalized) - Plan: Ot plan of care cert/re-cert  Visuospatial deficit - Plan: Ot plan of care cert/re-cert    Problem List Patient Active Problem List   Diagnosis Date  Noted  . Cerebellar ataxia (HCC) 11/14/2017  . Cerebellar infarct (HCC)   . Essential hypertension   . Cerebellar stroke (HCC) 11/12/2017  . Nausea and vomiting 11/12/2017  . Dizziness and giddiness 11/12/2017  . Hyperglycemia 11/12/2017  . Leukocytosis 11/12/2017  . Alcohol use 11/12/2017    Mackie Paiulaski, Jalaysha [facility] Center[facility],OTR/L 11/25/2017, 4:30 PM  [facility] [address] [place], [place], [address] Phone: [phone]   Fax:  [phone]  Name: Elvina SidleDavid G Meske MRN: [id number] Date of Birth: 1965-05-29

## 2017-11-26 ENCOUNTER — Telehealth: Payer: Self-pay

## 2017-11-26 NOTE — Telephone Encounter (Signed)
Received call from [employer] that PFO case has been moved to 1030 (patient will need to arrive at 0830).  Called patient to confirm new time.  Left message to call back.

## 2017-11-27 ENCOUNTER — Ambulatory Visit: Payer: BLUE CROSS/BLUE SHIELD

## 2017-11-27 ENCOUNTER — Telehealth: Payer: Self-pay | Admitting: *Deleted

## 2017-11-27 NOTE — Telephone Encounter (Signed)
LVMOM regarding patient appointment today 11/27/17. Gave [facility] appointment to return phone call.

## 2017-11-27 NOTE — Telephone Encounter (Signed)
Spoke with patient regarding appointment today. No LINQ implanted. Appointment cancelled.

## 2017-11-28 ENCOUNTER — Encounter: Payer: Self-pay | Admitting: Rehabilitation

## 2017-11-28 ENCOUNTER — Ambulatory Visit: Payer: BLUE CROSS/BLUE SHIELD | Admitting: Rehabilitation

## 2017-11-28 DIAGNOSIS — R270 Ataxia, unspecified: Secondary | ICD-10-CM | POA: Diagnosis not present

## 2017-11-28 DIAGNOSIS — R278 Other lack of coordination: Secondary | ICD-10-CM | POA: Diagnosis not present

## 2017-11-28 DIAGNOSIS — M6281 Muscle weakness (generalized): Secondary | ICD-10-CM | POA: Diagnosis not present

## 2017-11-28 DIAGNOSIS — R2689 Other abnormalities of gait and mobility: Secondary | ICD-10-CM

## 2017-11-28 DIAGNOSIS — R41842 Visuospatial deficit: Secondary | ICD-10-CM | POA: Diagnosis not present

## 2017-11-28 DIAGNOSIS — R2681 Unsteadiness on feet: Secondary | ICD-10-CM | POA: Diagnosis not present

## 2017-11-28 NOTE — Therapy (Signed)
[facility] [address] [place], [place], [address] Phone: [phone]   Fax:  [phone]  Physical Therapy Evaluation  Patient Details  Name: John Mcbride MRN: [id number] Date of Birth: [birth year] Referring Provider: Dr. Claudette LawsAndrew [name]   Encounter Date: 11/28/2017  PT End of Session - 11/28/17 1254    Visit Number  1    Number of Visits  17    Date for PT Re-Evaluation  01/27/18    Authorization Type  BCBS     Authorization - Visit Number  1    Authorization - Number of Visits  20   pt wanting to use 20 for PT, 10 for OT   PT Start Time  1020    PT Stop Time  1103    PT Time Calculation (min)  43 min    Activity Tolerance  Patient tolerated treatment well    Behavior During Therapy  [facility] for tasks assessed/performed       Past Medical History:  Diagnosis Date  . Heart murmur     Past Surgical History:  Procedure Laterality Date  . TEE WITHOUT CARDIOVERSION N/A 11/14/2017   Procedure: TRANSESOPHAGEAL ECHOCARDIOGRAM (TEE);  Surgeon: Pricilla Riffleoss, Paula V, MD;  [facility];  Service: Cardiovascular;  Laterality: N/A;  bubble study    There were no vitals filed for this visit.   Subjective Assessment - 11/28/17 1023    Subjective  "I want to walk without the walker.  I can usually walk holding the walker, but sometimes I do need it.  The left leg feels like it may buckle some times."    Pertinent History  HTN, alcohol use    Limitations  House hold activities;Walking    Patient Stated Goals  "Be more balanced and walk without the walker."     Currently in Pain?  No/denies         [facility] PT Assessment - 11/28/17 1025      Assessment   Medical Diagnosis  Cerebellar CVA with ataxia    Referring Provider  Dr. Claudette LawsAndrew [name]    Onset Date/Surgical Date  11/12/17    Hand Dominance  Right    Prior Therapy  Inpatient rehab OT/PT/SLP      Precautions   Precautions  Fall      Restrictions    Weight Bearing Restrictions  No      Balance Screen   Has the patient fallen in the past 6 months  No    Has the patient had a decrease in activity level because of a fear of falling?   No    Is the patient reluctant to leave their home because of a fear of falling?   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  Pt states he can go back at light duty which would be very minimal physical demand.    Leisure  [employer], ride 4 wheelers, listen to music, [employer]   Overall Cognitive Status  Within Functional Limits for tasks assessed      Sensation   Light Touch  Appears Intact    Hot/Cold  Appears Intact    Proprioception  Appears Intact      Coordination   Gross Motor Movements are Fluid and Coordinated  Yes    Fine Motor Movements are Fluid and Coordinated  No    Coordination and Movement Description  decreased in LEs    Heel Shin Test  slight trouble getting to target, but then was mostly WFL      ROM / Strength   AROM / PROM / Strength  Strength      AROM   Overall AROM   Within functional limits for tasks performed    Overall AROM Comments  WFL's      Strength   Overall Strength  Within functional limits for tasks performed    Overall Strength Comments  Some mild L LE deficits-seems to be more of a coordination deficit than strength      Transfers   Transfers  Sit to Stand;Stand to Sit     Sit to Stand  7: Independent    Five time sit to stand comments   10.16 secs without UE support    Stand to Sit  7: Independent      Ambulation/Gait   Ambulation/Gait  Yes    Ambulation/Gait Assistance  4: Min assist;6: Modified independent (Device/Increase time)    Ambulation/Gait Assistance Details  Mod I with RW, however due to decreased coordination and balance requires min A during gait without AD    Ambulation Distance (Feet)  300 Feet    Assistive device  Rolling walker;None    Gait Pattern  Step-through pattern;Decreased arm swing - left;Decreased stance time - left;Ataxic;Lateral hip instability;Wide base of support    Ambulation Surface  Level;Indoor    Gait velocity  2.52 ft/sec without AD, 2.58 ft/sec with RW    Stairs  Yes    Stairs Assistance  6: Modified independent (Device/Increase time)    Stair Management Technique  One rail Right;Alternating pattern;Forwards    Number of Stairs  4    Height of Stairs  6      Standardized Balance Assessment   Standardized Balance Assessment  Berg Balance Test;Timed Up and Go Test      Berg Balance Test   Sit to Stand  Able to stand without using hands and stabilize independently    Standing Unsupported  Able to stand safely 2 minutes    Sitting with Back Unsupported but Feet Supported on Floor or Stool  Able to sit safely and securely 2 minutes    Stand to Sit  Sits safely with minimal use of hands    Transfers  Able to transfer safely, minor use of hands    Standing Unsupported with Eyes Closed  Able to stand 10 seconds with supervision    Standing Ubsupported with Feet Together  Able to place feet together independently and stand 1 minute safely    From Standing, Reach Forward with Outstretched Arm  Can reach confidently >25 cm (10")    From Standing Position, Pick up Object from Floor  Able to pick up shoe safely and easily    From  Standing Position, Turn to Look Behind Over each Shoulder  Looks behind from both sides and weight  shifts well    Turn 360 Degrees  Able to turn 360 degrees safely but slowly    Standing Unsupported, Alternately Place Feet on Step/Stool  Able to complete 4 steps without aid or supervision    Standing Unsupported, One Foot in Front  Able to plae foot ahead of the other independently and hold 30 seconds    Standing on One Leg  Tries to lift leg/unable to hold 3 seconds but remains standing independently    Total Score  47    Berg comment:  46-51 moderate (>50%)       Timed Up and Go Test   TUG  Normal TUG    Normal TUG (seconds)  13.62   with SPC, 14.47 secs w/o AD               Objective measurements completed on examination: See above findings.              PT Education - 11/28/17 1253    Education Details  Educated on POC, goals and evaluation results, starting to walk indoors with use of cane at S level     Person(s) Educated  Patient    Methods  Explanation    Comprehension  Verbalized understanding       PT Short Term Goals - 11/28/17 1302      PT SHORT TERM GOAL #1   Title  Pt will initiate HEP in order to indicate improved balance and coordination. (Target Date: 12/28/17)    Time  4    Period  Weeks    Status  New    Target Date  12/28/17      PT SHORT TERM GOAL #2   Title  Pt will improve BERG balance test to 51/56 in order to indicate decreased fall risk.     Status  New      PT SHORT TERM GOAL #3   Title  Pt will perform TUG <13.5 secs without AD in order to indicate decreased fall risk.     Status  New      PT SHORT TERM GOAL #4   Title  Pt will ambulate over unlevel outdoor surfaces (paved and grass) with SPC at mod I level in order to indicate improved functional and community independence.     Status  New      PT SHORT TERM GOAL #5   Title  Pt will improve gait speed to >/=2.62 ft/sec with LRAD in order to indicate improved community ambulation.     Status  New        PT Long Term Goals - 11/28/17 1306      PT LONG TERM GOAL #1    Title  Pt will be independent with final HEP in order to indicate improved balance and coordination.  (Target Date: 01/27/18)    Time  8    Period  Weeks    Status  New    Target Date  01/27/18      PT LONG TERM GOAL #2   Title  Pt will perform FGA as appropriate and improve score by 4 points.      Status  New      PT LONG TERM GOAL #3   Title  Pt will improve gait speed to 3.12 ft/sec without AD in order to indicate decreased fall risk and improved efficiency of gait.  Status  New      PT LONG TERM GOAL #4   Title  Pt will ambulate >1000' over varying outdoor surfaces without AD at independent level in order to indicate safe return to leisure and community activity.      Status  New      PT LONG TERM GOAL #5   Title  Pt will be able to carry 20# for 20' demonstrating safe body mechanics when lifting and placing to ground in order to indicate safe return to work.     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      Status  New             Plan - 11/28/17 1255    Clinical Impression Statement  Pt presents following L cerebellar CVA on 8/14 with IP rehab admission from 8/16-8/23 with decreased coordination in LUE/LE, decreased balance, inability to return to work or drive.  Note history of HTN and alcohol use.  Upon PT evaluation, note BERG balance score of 47/56 indicative of moderate fall risk, gait speed of 2.52 ft/sec with RW, 2.58 ft/sec without AD both indicative of limited community ambulation, TUG time of 13.62 secs with SPC and 14.47 secs without AD, both indicative of increased fall risk.  Pt will benefit from skilled OP neuro PT in order to address deficits.     History and Personal Factors relevant to plan of care:  HTN, alcohol use, inability  to drive and return to work yet    Clinical Presentation  Evolving    Clinical Presentation due to:  see above    Clinical Decision Making  Moderate    Rehab Potential  Good    PT Frequency  2x / week    PT Duration  8 weeks    PT Treatment/Interventions  ADLs/Self Care Home Management;Aquatic Therapy;Electrical Stimulation;DME Instruction;Gait training;Stair training;Functional mobility training;Therapeutic activities;Therapeutic exercise;Balance training;Neuromuscular re-education;Patient/family education;Vestibular    PT Next Visit Plan  Assess vestibular deficits as able (VOR), initaite HEP for high level balance and coordination (L SLS), gait with SPC (did he start using at home)    Consulted and Agree with Plan of Care  Patient       Patient will benefit from skilled therapeutic intervention in order to improve the following deficits and impairments:  Abnormal gait, Decreased activity tolerance, Decreased balance, Decreased coordination, Decreased endurance, Decreased mobility, Decreased knowledge of use of DME, Dizziness, Impaired perceived functional ability, Impaired vision/preception  Visit Diagnosis: Ataxia  Other lack of coordination  Unsteadiness on feet  Other abnormalities of gait and mobility     Problem List Patient Active Problem List   Diagnosis Date Noted  . Cerebellar ataxia (HCC) 11/14/2017  . Cerebellar infarct (HCC)   . Essential hypertension   . Cerebellar stroke (HCC) 11/12/2017  . Nausea and vomiting 11/12/2017  . Dizziness and giddiness 11/12/2017  . Hyperglycemia 11/12/2017  . Leukocytosis 11/12/2017  . Alcohol use 11/12/2017    Harriet Butte, PT, MPT [facility] [address] [place], [place], [address] Phone: [phone]   Fax:  [phone] 11/28/17, 1:16 PM  Name: John Mcbride MRN: [id number] Date  of Birth: 1966-01-05

## 2017-12-02 NOTE — Telephone Encounter (Signed)
Left message to call back to confirm new arrival time.

## 2017-12-02 NOTE — Telephone Encounter (Signed)
Confirmed with patient new arrival time for PFO closure to arrive at 0830. Reviewed procedure instructions with patient. He was grateful for call and agrees with treatment plan.

## 2017-12-03 DIAGNOSIS — Q211 Atrial septal defect: Secondary | ICD-10-CM

## 2017-12-03 DIAGNOSIS — Q2112 Patent foramen ovale: Secondary | ICD-10-CM

## 2017-12-04 ENCOUNTER — Other Ambulatory Visit: Payer: Self-pay

## 2017-12-04 ENCOUNTER — Encounter (HOSPITAL_COMMUNITY)
Admission: RE | Disposition: A | Discharge status: Home / Self Care | Payer: Self-pay | Source: Ambulatory Visit | Attending: Cardiovascular Disease

## 2017-12-04 ENCOUNTER — Ambulatory Visit (HOSPITAL_COMMUNITY)
Admission: RE | Admit: 2017-12-04 | Discharge: 2017-12-04 | Disposition: A | Discharge status: Home / Self Care | Payer: BLUE CROSS/BLUE SHIELD | Source: Ambulatory Visit | Attending: Cardiovascular Disease | Admitting: Cardiovascular Disease

## 2017-12-04 ENCOUNTER — Ambulatory Visit (HOSPITAL_BASED_OUTPATIENT_CLINIC_OR_DEPARTMENT_OTHER): Payer: BLUE CROSS/BLUE SHIELD

## 2017-12-04 DIAGNOSIS — Z9889 Other specified postprocedural states: Secondary | ICD-10-CM | POA: Diagnosis not present

## 2017-12-04 DIAGNOSIS — Z79899 Other long term (current) drug therapy: Secondary | ICD-10-CM | POA: Diagnosis not present

## 2017-12-04 DIAGNOSIS — Z7982 Long term (current) use of aspirin: Secondary | ICD-10-CM | POA: Diagnosis not present

## 2017-12-04 DIAGNOSIS — I5030 Unspecified diastolic (congestive) heart failure: Secondary | ICD-10-CM | POA: Diagnosis not present

## 2017-12-04 DIAGNOSIS — Z7902 Long term (current) use of antithrombotics/antiplatelets: Secondary | ICD-10-CM | POA: Insufficient documentation

## 2017-12-04 DIAGNOSIS — Q2112 Patent foramen ovale: Secondary | ICD-10-CM

## 2017-12-04 DIAGNOSIS — Z8249 Family history of ischemic heart disease and other diseases of the circulatory system: Secondary | ICD-10-CM | POA: Insufficient documentation

## 2017-12-04 DIAGNOSIS — Q211 Atrial septal defect: Secondary | ICD-10-CM

## 2017-12-04 DIAGNOSIS — I639 Cerebral infarction, unspecified: Secondary | ICD-10-CM | POA: Diagnosis not present

## 2017-12-04 HISTORY — PX: PATENT FORAMEN OVALE(PFO) CLOSURE: CATH118300

## 2017-12-04 LAB — POCT ACTIVATED CLOTTING TIME
ACTIVATED CLOTTING TIME: 257 s
Activated Clotting Time: 230 seconds
Activated Clotting Time: 180 seconds

## 2017-12-04 LAB — ECHOCARDIOGRAM LIMITED
Height: 70 in
Weight: 3472 oz

## 2017-12-04 SURGERY — PATENT FORAMEN OVALE (PFO) CLOSURE
Anesthesia: LOCAL

## 2017-12-04 MED ORDER — ASPIRIN 81 MG PO CHEW: 81.0000 mg | CHEWABLE_TABLET | ORAL | Status: DC

## 2017-12-04 MED ORDER — AMOXICILLIN 500 MG PO TABS: 2000.0000 mg | ORAL_TABLET | ORAL | 1 refills | Status: DC

## 2017-12-04 MED ORDER — SODIUM CHLORIDE 0.9 % WEIGHT BASED INFUSION: 1.0000 mL/kg/h | INTRAVENOUS | Status: DC

## 2017-12-04 MED ORDER — CEFAZOLIN SODIUM-DEXTROSE 2-4 GM/100ML-% IV SOLN
INTRAVENOUS | Status: AC
  Filled 2017-12-04: qty 100

## 2017-12-04 MED ORDER — HEPARIN (PORCINE) IN NACL 1000-0.9 UT/500ML-% IV SOLN
INTRAVENOUS | Status: DC | PRN
  Administered 2017-12-04 (×3): 500 mL

## 2017-12-04 MED ORDER — SODIUM CHLORIDE 0.9% FLUSH: 3.0000 mL | INTRAVENOUS | Status: DC | PRN

## 2017-12-04 MED ORDER — HEPARIN SODIUM (PORCINE) 1000 UNIT/ML IJ SOLN
INTRAMUSCULAR | Status: AC
  Filled 2017-12-04: qty 1

## 2017-12-04 MED ORDER — LIDOCAINE HCL (PF) 1 % IJ SOLN
INTRAMUSCULAR | Status: AC
  Filled 2017-12-04: qty 30

## 2017-12-04 MED ORDER — SODIUM CHLORIDE 0.9 % IV SOLN: 250.0000 mL | INTRAVENOUS | Status: DC | PRN

## 2017-12-04 MED ORDER — HEPARIN (PORCINE) IN NACL 1000-0.9 UT/500ML-% IV SOLN
INTRAVENOUS | Status: AC
  Filled 2017-12-04: qty 1000

## 2017-12-04 MED ORDER — SODIUM CHLORIDE 0.9 % IV SOLN
INTRAVENOUS | Status: AC | PRN
  Administered 2017-12-04: 10 mL/h via INTRAVENOUS

## 2017-12-04 MED ORDER — CLOPIDOGREL BISULFATE 75 MG PO TABS: 75.0000 mg | ORAL_TABLET | ORAL | Status: DC

## 2017-12-04 MED ORDER — CEFAZOLIN SODIUM-DEXTROSE 2-4 GM/100ML-% IV SOLN
2.0000 g | INTRAVENOUS | Status: AC
  Administered 2017-12-04: 2 g via INTRAVENOUS

## 2017-12-04 MED ORDER — FENTANYL CITRATE (PF) 100 MCG/2ML IJ SOLN
INTRAMUSCULAR | Status: DC | PRN
  Administered 2017-12-04: 25 ug via INTRAVENOUS

## 2017-12-04 MED ORDER — HEPARIN (PORCINE) IN NACL 1000-0.9 UT/500ML-% IV SOLN
INTRAVENOUS | Status: AC
  Filled 2017-12-04: qty 500

## 2017-12-04 MED ORDER — MIDAZOLAM HCL 2 MG/2ML IJ SOLN
INTRAMUSCULAR | Status: DC | PRN
  Administered 2017-12-04: 2 mg via INTRAVENOUS

## 2017-12-04 MED ORDER — ONDANSETRON HCL 4 MG/2ML IJ SOLN: 4.0000 mg | Freq: Four times a day (QID) | INTRAMUSCULAR | Status: DC | PRN

## 2017-12-04 MED ORDER — MIDAZOLAM HCL 2 MG/2ML IJ SOLN
INTRAMUSCULAR | Status: AC
  Filled 2017-12-04: qty 2

## 2017-12-04 MED ORDER — SODIUM CHLORIDE 0.9% FLUSH: 3.0000 mL | Freq: Two times a day (BID) | INTRAVENOUS | Status: DC

## 2017-12-04 MED ORDER — HEPARIN SODIUM (PORCINE) 1000 UNIT/ML IJ SOLN
INTRAMUSCULAR | Status: DC | PRN
  Administered 2017-12-04: 8000 [IU] via INTRAVENOUS
  Administered 2017-12-04: 3000 [IU] via INTRAVENOUS

## 2017-12-04 MED ORDER — LIDOCAINE HCL (PF) 1 % IJ SOLN
INTRAMUSCULAR | Status: DC | PRN
  Administered 2017-12-04: 15 mL

## 2017-12-04 MED ORDER — SODIUM CHLORIDE 0.9 % WEIGHT BASED INFUSION
3.0000 mL/kg/h | INTRAVENOUS | Status: AC
  Administered 2017-12-04: 3 mL/kg/h via INTRAVENOUS

## 2017-12-04 MED ORDER — FENTANYL CITRATE (PF) 100 MCG/2ML IJ SOLN
INTRAMUSCULAR | Status: AC
  Filled 2017-12-04: qty 2

## 2017-12-04 MED ORDER — ACETAMINOPHEN 325 MG PO TABS: 650.0000 mg | ORAL_TABLET | ORAL | Status: DC | PRN

## 2017-12-04 MED ORDER — CLOPIDOGREL BISULFATE 75 MG PO TABS: 75.0000 mg | ORAL_TABLET | Freq: Every day | ORAL | 0 refills | Status: AC

## 2017-12-04 SURGICAL SUPPLY — 15 items
CATH ACUNAV REPROCESSED (CATHETERS) ×2 IMPLANT
CATH INFINITI 6F MPA2 100CM (CATHETERS) ×2 IMPLANT
COVER SWIFTLINK CONNECTOR (BAG) ×2 IMPLANT
GUIDEWIRE AMPLATZER 1.5JX260 (WIRE) ×2 IMPLANT
OCCLUDER AMPLATZER PFO 25MM (Prosthesis & Implant Heart) ×2 IMPLANT
PACK CARDIAC CATHETERIZATION (CUSTOM PROCEDURE TRAY) ×2 IMPLANT
PROTECTION STATION PRESSURIZED (MISCELLANEOUS) ×2
SHEATH INTROD W/O MIN 9FR 25CM (SHEATH) ×2 IMPLANT
SHEATH PINNACLE 8F 10CM (SHEATH) ×2 IMPLANT
SHEATH PROBE COVER 6X72 (BAG) ×2 IMPLANT
STATION PROTECTION PRESSURIZED (MISCELLANEOUS) ×1 IMPLANT
SYS DELIVER AMP TREVISIO 8FR (SHEATH) ×2
SYSTEM DELIVER AMP TREVIS 8FR (SHEATH) ×1 IMPLANT
WIRE AQUATRAK .035X260 ANG (WIRE) ×2 IMPLANT
WIRE EMERALD 3MM-J .035X150CM (WIRE) ×2 IMPLANT

## 2017-12-04 NOTE — Discharge Instructions (Signed)
You will require antibiotics prior to any dental work, including cleanings, for 6 months after your PFO closure. This is to protect the device from potentially getting infected from bacteria in your mouth entering your bloodstream. The medication has been called into your pharmacy on file. Please pick this up to have ready before any scheduled dental work. Instructions will be outlined on the bottle. The medication should be taken 1 hour prior to your dental appointment. ° ° °Groin Site Care °Refer to this sheet in the next few weeks. These instructions provide you with information on caring for yourself after your procedure. Your caregiver may also give you more specific instructions. Your treatment has been planned according to current medical practices, but problems sometimes occur. Call your caregiver if you have any problems or questions after your procedure. °HOME CARE INSTRUCTIONS °· You may shower 24 hours after the procedure. Remove the bandage (dressing) and gently wash the site with plain soap and water. Gently pat the site dry.  °· Do not apply powder or lotion to the site.  °· Do not sit in a bathtub, swimming pool, or whirlpool for 5 to 7 days.  °· No bending, squatting, or lifting anything over 10 pounds (4.5 kg) as directed by your caregiver.  °· Inspect the site at least twice daily.  °· Do not drive home if you are discharged the same day of the procedure. Have someone else drive you.  °· You may drive 24 hours after the procedure unless otherwise instructed by your caregiver.  °What to expect: °· Any bruising will usually fade within 1 to 2 weeks.  °· Blood that collects in the tissue (hematoma) may be painful to the touch. It should usually decrease in size and tenderness within 1 to 2 weeks.  °SEEK IMMEDIATE MEDICAL CARE IF: °· You have unusual pain at the groin site or down the affected leg.  °· You have redness, warmth, swelling, or pain at the groin site.  °· You have drainage (other than a  small amount of blood on the dressing).  °· You have chills.  °· You have a fever or persistent symptoms for more than 72 hours.  °· You have a fever and your symptoms suddenly get worse.  °· Your leg becomes pale, cool, tingly, or numb.  °You have heavy bleeding from the site. Hold pressure on the site. ° ° °

## 2017-12-04 NOTE — Interval H&P Note (Signed)
History and Physical Interval Note:  12/04/2017 9:53 AM  John Mcbride  has presented today for surgery, with the diagnosis of pfo  The various methods of treatment have been discussed with the patient and family. After consideration of risks, benefits and other options for treatment, the patient has consented to  Procedure(s): PATENT FORAMEN OVALE (PFO) CLOSURE (N/A) as a surgical intervention .  The patient's history has been reviewed, patient examined, no change in status, stable for surgery.  I have reviewed the patient's chart and labs.  Questions were answered to the patient's satisfaction.     [name]

## 2017-12-04 NOTE — Progress Notes (Addendum)
Site area: RFV x2 Site Prior to Removal:  Level 0 Pressure Applied For:30 min Manual:   yes Patient Status During Pull:  stable Post Pull Site:  Level 0 Post Pull Instructions Given:  yes Post Pull Pulses Present: palpable Dressing Applied:  clear Bedrest begins @ 1230 till 1630 Comments:

## 2017-12-04 NOTE — Progress Notes (Signed)
  Echocardiogram 2D Echocardiogram has been performed.  Delcie Roch 12/04/2017, 1:54 PM

## 2017-12-04 NOTE — Research (Signed)
AMPLATZER PFO Informed Consent              Subject Name: Feliz G. Dado   Subject met inclusion and exclusion criteria.  The informed consent form, study requirements and expectations were reviewed with the subject and questions and concerns were addressed prior to the signing of the consent form.  The subject verbalized understanding of the trial requirements.  The subject agreed to participate in the AMPLATZER PFO trial and signed the informed consent.  The informed consent was obtained prior to performance of any protocol-specific procedures for the subject.  A copy of the signed informed consent was given to the subject and a copy was placed in the subject's medical record.         Kenya L. Chalmers 12/04/2017 09:25 a.m. 

## 2017-12-05 ENCOUNTER — Encounter (HOSPITAL_COMMUNITY): Payer: Self-pay | Admitting: Cardiovascular Disease

## 2017-12-11 ENCOUNTER — Encounter: Payer: BLUE CROSS/BLUE SHIELD | Attending: Physical Medicine & Rehabilitation

## 2017-12-11 ENCOUNTER — Ambulatory Visit (HOSPITAL_BASED_OUTPATIENT_CLINIC_OR_DEPARTMENT_OTHER): Payer: BLUE CROSS/BLUE SHIELD | Admitting: Physical Medicine & Rehabilitation

## 2017-12-11 ENCOUNTER — Encounter: Payer: Self-pay | Admitting: Physical Medicine & Rehabilitation

## 2017-12-11 VITALS — BP 131/82 | HR 94 | Resp 14 | Ht 70.0 in | Wt 224.0 lb

## 2017-12-11 DIAGNOSIS — I69393 Ataxia following cerebral infarction: Secondary | ICD-10-CM | POA: Diagnosis not present

## 2017-12-11 DIAGNOSIS — R269 Unspecified abnormalities of gait and mobility: Secondary | ICD-10-CM | POA: Insufficient documentation

## 2017-12-11 DIAGNOSIS — G119 Hereditary ataxia, unspecified: Secondary | ICD-10-CM | POA: Diagnosis not present

## 2017-12-11 DIAGNOSIS — Z8249 Family history of ischemic heart disease and other diseases of the circulatory system: Secondary | ICD-10-CM | POA: Insufficient documentation

## 2017-12-11 DIAGNOSIS — I639 Cerebral infarction, unspecified: Secondary | ICD-10-CM | POA: Diagnosis not present

## 2017-12-11 DIAGNOSIS — Z7982 Long term (current) use of aspirin: Secondary | ICD-10-CM | POA: Insufficient documentation

## 2017-12-11 DIAGNOSIS — Z7902 Long term (current) use of antithrombotics/antiplatelets: Secondary | ICD-10-CM | POA: Diagnosis not present

## 2017-12-11 DIAGNOSIS — I69398 Other sequelae of cerebral infarction: Secondary | ICD-10-CM | POA: Diagnosis not present

## 2017-12-11 NOTE — Progress Notes (Signed)
Subjective:    Patient ID: John Mcbride, male    DOB: Oct 08, 1965, 52 y.o.   MRN: [id number] 52 year old right-handed male with question history of alcohol use on no prescription medication who lives with a roommate.  Independent prior to admission.  Working full time.  Presented 11/12/2017 with nausea and vomiting  as well as dizziness and incoordination.  Cranial CT scan showed suggestion of asymmetrical low attenuation and possible mass effect in the left anterior cerebellum and cerebellar peduncle.  MRI showed a 3.5 cm diameter acute infarction in the superior  cerebellum on the left.  No evidence of mass effect or hemorrhage.  CT angiogram of head and neck with no large vessel occlusion.  The patient did not receive tPA.  Echocardiogram with ejection fraction of 65%, no wall motion abnormalities.  Neurology  consulted.  Maintained on aspirin and Plavix therapy. DATE OF ADMISSION:  11/14/2017   DATE OF DISCHARGE:  11/21/2017   HPI Patient living at home.  He is independent with all his bathing and dressing as well as mobility using a cane.  He has purchased an off-the-shelf left knee orthosis due to left knee buckling. He has just started physical therapy 1 session thus far.  He has additional visits scheduled for next week. He has had no falls or any trauma. He is not working.  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 Patient has also returned to work.  He also would like to resume driving.  Pain Inventory Average Pain 1 Pain Right Now 1 My pain is no pain  In the last 24 hours, has pain interfered with the following? General activity 0 Relation with others 0 Enjoyment of life 0 What TIME of day is your pain at its worst? no pain Sleep (in general) Good  Pain is worse with: no pain Pain improves with: no pain Relief from Meds: no pain  Mobility walk with assistance use a cane ability to climb steps?  yes  Function disabled: date disabled  .  Neuro/Psych trouble walking dizziness confusion  Prior Studies Any changes since last visit?  no  Physicians involved in your care Any changes since last visit?  no   Family History  Problem Relation Age of Onset  . Cancer Mother   . Cancer Father   . Heart disease Father    Social History   Socioeconomic History  . Marital status: Single    Spouse name: Not on file  . Number of children: Not on file  . Years of education: Not on file  . Highest education level: Not on file  Occupational History  . 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 Financial resource strain: Not hard at all  . Food insecurity:    Worry: Never true    Inability: Never true  . Transportation needs:    Medical: No    Non-medical: No  Tobacco Use  . Smoking status: Never Smoker  . Smokeless tobacco: Never Used  Substance and Sexual Activity  . Alcohol use: Yes    Alcohol/week: 10.0 standard drinks    Types: 10 Standard drinks or equivalent per week  . Drug use: No  . Sexual activity: Not on file  Lifestyle  . Physical activity:    Days per week: 0 days    Minutes per session: 0 min  . Stress: Not at all  Relationships  . Social connections:    Talks on phone: More  than three times a week    Gets together: More than three times a week    Attends religious service: Patient refused    Active member of club or organization: Patient refused    Attends meetings of clubs or organizations: Patient refused    Relationship status: Patient refused  Other Topics Concern  . Not on file  Social History Narrative  . Not on file   Past Surgical History:  Procedure Laterality Date  . PATENT FORAMEN OVALE(PFO) CLOSURE N/A 12/04/2017   Procedure: PATENT FORAMEN OVALE (PFO) CLOSURE;  Surgeon: [name], MD;  Location: [facility] INVASIVE CV LAB;  Service: Cardiovascular;  Laterality: N/A;  . TEE WITHOUT CARDIOVERSION N/A 11/14/2017   Procedure: TRANSESOPHAGEAL  ECHOCARDIOGRAM (TEE);  Surgeon: Pricilla Riffle, MD;  Location: [facility] ENDOSCOPY;  Service: Cardiovascular;  Laterality: N/A;  bubble study   Past Medical History:  Diagnosis Date  . Heart murmur    BP 131/82 (BP Location: Left Arm, Patient Position: Sitting, Cuff Size: Normal)   Pulse 94   Resp 14   Ht 5\' 10"  (1.778 m)   Wt 224 lb (101.6 kg)   SpO2 97%   BMI 32.14 kg/m   Opioid Risk Score:   Fall Risk Score:  `1  Depression screen PHQ 2/9  Depression screen [facility] 2/9 11/12/2017 01/26/2015  Decreased Interest 0 0  Down, Depressed, Hopeless 0 0  PHQ - 2 Score 0 0    Review of Systems  Constitutional: Negative.   HENT: Negative.   Respiratory: Negative.   Cardiovascular: Negative.   Gastrointestinal: Negative.   Endocrine: Negative.   Genitourinary: Negative.   Musculoskeletal: Positive for gait problem.  Skin: Negative.   Allergic/Immunologic: Negative.   Neurological: Positive for dizziness and light-headedness.  Hematological: Negative.   Psychiatric/Behavioral: Negative.        Objective:   Physical Exam  Constitutional: He is oriented to person, place, and time. He appears well-developed and well-nourished. No distress.  HENT:  Head: Normocephalic and atraumatic.  Eyes: Pupils are equal, round, and reactive to light. EOM are normal.  Neck: Normal range of motion.  Cardiovascular: Normal rate and regular rhythm.  No murmur heard. Pulmonary/Chest: Effort normal and breath sounds normal. No respiratory distress.  Abdominal: Soft. Bowel sounds are normal. He exhibits no distension. There is no tenderness.  Neurological: He is alert and oriented to person, place, and time.  5/5 strength bilateral deltoid, bicep, tricep, grip, hip flexor, knee extensor, ankle dorsiflexor Sensation intact light touch bilateral upper and lower limbs Ambulates with a cane he is a wide-based support no evidence of toe drag or knee instability except for large amplitude tremor of the left  knee. He is able to ambulate without his cane as well with standby assistance. Patient has mild dysmetria right finger-nose-finger as well as right heel-to-shin normal findings on the left side  Skin: Skin is warm and dry. He is not diaphoretic.  Nursing note and vitals reviewed.         Assessment & Plan:  1.  Right cerebellar infarct with right hemiataxia and gait disorder.  Overall at a modified independent level for basic self-care and mobility.  He still requires a cane for safe ambulation.  He cannot carry or lift objects without losing his balance at this point. He is unable to work at the current time. Patient needs to complete outpatient therapy and be reevaluated at this clinic in approximately 6 weeks. He may return to driving in a graduated fashion  He will follow-up with neurology for secondary stroke prophylaxis as well as with primary care to modify his stroke risk factors  Graduated return to driving instructions were provided. It is recommended that the patient first drives with another licensed driver in an empty parking lot. If the patient does well with this, and they can drive on a quiet street with the licensed driver. If the patient does well with this they can drive on a busy street with a licensed driver. If the patient does well with this, the next time out they can go by himself. For the first month after resuming driving, I recommend no nighttime or Interstate driving.

## 2017-12-11 NOTE — Patient Instructions (Signed)

## 2017-12-16 DIAGNOSIS — E7800 Pure hypercholesterolemia, unspecified: Secondary | ICD-10-CM | POA: Diagnosis not present

## 2017-12-16 DIAGNOSIS — I639 Cerebral infarction, unspecified: Secondary | ICD-10-CM | POA: Diagnosis not present

## 2017-12-16 DIAGNOSIS — Z8774 Personal history of (corrected) congenital malformations of heart and circulatory system: Secondary | ICD-10-CM | POA: Diagnosis not present

## 2017-12-16 DIAGNOSIS — Z23 Encounter for immunization: Secondary | ICD-10-CM | POA: Diagnosis not present

## 2017-12-16 DIAGNOSIS — Z1389 Encounter for screening for other disorder: Secondary | ICD-10-CM | POA: Diagnosis not present

## 2017-12-17 ENCOUNTER — Encounter

## 2017-12-18 ENCOUNTER — Encounter: Payer: Self-pay | Admitting: Rehabilitation

## 2017-12-18 ENCOUNTER — Ambulatory Visit: Payer: BLUE CROSS/BLUE SHIELD | Admitting: Rehabilitation

## 2017-12-18 ENCOUNTER — Ambulatory Visit: Payer: BLUE CROSS/BLUE SHIELD | Attending: Physical Medicine & Rehabilitation | Admitting: Occupational Therapy

## 2017-12-18 DIAGNOSIS — M6281 Muscle weakness (generalized): Secondary | ICD-10-CM

## 2017-12-18 DIAGNOSIS — R41842 Visuospatial deficit: Secondary | ICD-10-CM | POA: Diagnosis not present

## 2017-12-18 DIAGNOSIS — R270 Ataxia, unspecified: Secondary | ICD-10-CM | POA: Insufficient documentation

## 2017-12-18 DIAGNOSIS — R278 Other lack of coordination: Secondary | ICD-10-CM

## 2017-12-18 DIAGNOSIS — R2689 Other abnormalities of gait and mobility: Secondary | ICD-10-CM | POA: Diagnosis not present

## 2017-12-18 DIAGNOSIS — R2681 Unsteadiness on feet: Secondary | ICD-10-CM

## 2017-12-18 NOTE — Therapy (Signed)
[facility] [address] [place], [place], [address] Phone: [phone]   Fax:  [phone]  Physical Therapy Treatment  Patient Details  Name: John Mcbride MRN: [id number] Date of Birth: [birth year] Referring Provider: Dr. Claudette Laws   Encounter Date: 12/18/2017  PT End of Session - 12/18/17 1316    Visit Number  2    Number of Visits  17    Date for PT Re-Evaluation  01/27/18    Authorization Type  BCBS     Authorization - Visit Number  2    Authorization - Number of Visits  20   pt wanting to use 20 for PT, 10 for OT   PT Start Time  1317    PT Stop Time  1400    PT Time Calculation (min)  43 min    Activity Tolerance  Patient tolerated treatment well    Behavior During Therapy  [facility] for tasks assessed/performed       Past Medical History:  Diagnosis Date  . Heart murmur     Past Surgical History:  Procedure Laterality Date  . PATENT FORAMEN OVALE(PFO) CLOSURE N/A 12/04/2017   Procedure: PATENT FORAMEN OVALE (PFO) CLOSURE;  Surgeon: [name], MD;  Location: [facility] INVASIVE CV LAB;  Service: Cardiovascular;  Laterality: N/A;  . TEE WITHOUT CARDIOVERSION N/A 11/14/2017   Procedure: TRANSESOPHAGEAL ECHOCARDIOGRAM (TEE);  Surgeon: Pricilla Riffle, MD;  Location: [facility] ENDOSCOPY;  Service: Cardiovascular;  Laterality: N/A;  bubble study    There were no vitals filed for this visit.  Subjective Assessment - 12/18/17 1315    Subjective  Pt reports no changes since last visit.  Note that he presents to clinic with cane that mother bought in the past.     Pertinent History  HTN, alcohol use    Limitations  House hold activities;Walking    Patient Stated Goals  "Be more balanced and walk without the walker."     Currently in Pain?  No/denies                       [facility] Adult PT Treatment/Exercise - 12/18/17 1320      Ambulation/Gait   Ambulation/Gait  Yes    Ambulation/Gait Assistance  5:  Supervision;4: Min guard    Ambulation/Gait Assistance Details  Pt ambulatory with wooden cane into clinic that is too short based on pts height.  It does provide some stability however it does encourage him to have increased R lateral trunk lean during gait.   Note heavy R step causing less weight bearing through LLE as well as intermittent L genu recurvatum, esp when fatigued.  Added small heel wedge to L shoe to assess if this would assist.  Pt able to ambulate without AD for assessment.  Still note intermittent knee recurvatum, but was slightly improved, therefore left in shoe.  He tends to advance L LE with increased force, therefore provided cues to improve control.  Then trialed use of clinic SPC so that it could be adjusted to appropriate height.  This made a definite difference is fluidity of gait and note improvement in postural alignment.  Pt states that he will try and pick one up.   During session also had pt ambulate over uneven outdoor surfaces to better simulate walking on the beach this weekend.  Note increased knee recurvatum.  Pt reports he has knee brace he normally wears to prevent this.  PT/pt ambulated to pts car  to retrieve brace.  This did assist somewhat with decreasing force of recurvatum, however still intermittently would hyperextend esp with fatigue.     Ambulation Distance (Feet)  700 Feet   indoors and outdoors    Assistive device  Straight cane;None    Gait Pattern  Step-through pattern;Decreased arm swing - left;Decreased stance time - left;Ataxic;Lateral hip instability;Wide base of support    Ambulation Surface  Level;Unlevel;Indoor;Outdoor;Grass;Paved    Stairs  Yes    Stairs Assistance  6: Modified independent (Device/Increase time);5: Supervision    Stairs Assistance Details (indicate cue type and reason)  Encouraged pt to perform stairs in reciprocal pattern to address L LE coordination and motor control.  Added to HEP     Stair Management Technique  One rail  Right;Alternating pattern;Forwards    Number of Stairs  12    Height of Stairs  6      Self-Care   Self-Care  Other Self-Care Comments    Other Self-Care Comments   Discussed pts upcoming beach trip and being safe with ambulation, esp on the beach.   Recommended he wear sturdy shoes and knee brace to get down to packed sand and can then remove.  Also educated on PTs concern for L ankle instability over unpacked sand.  Pt verbalized understanding.   Also discussed walking program as pt reports and PT notes he fatigues quickly with activity.  Recommended starting with 5-8 mins at increasing slowly over time to improve endurance.  See pt instruction for details.        Neuro Re-ed    Neuro Re-ed Details   Performed stairs as above to address coordination.  LLE SLS x 3 reps of 15 secs with cues for decreased L knee extension-added to HEP, standing cone taps (instructed to use plastic cup at home) with both R and LLE for LLE coordination and improved proprioceptive input with WB tasks-added to HEP.               PT Education - 12/18/17 1520    Education Details  see self care, HEP    Person(s) Educated  Patient    Methods  Explanation;Demonstration;Handout    Comprehension  Verbalized understanding;Returned demonstration       PT Short Term Goals - 12/18/17 1523      PT SHORT TERM GOAL #1   Title  Pt will initiate HEP in order to indicate improved balance and coordination. (Target Date: 01/17/18-updated to reflect 3 week delay in start)    Time  4    Period  Weeks    Status  New    Target Date  01/17/18      PT SHORT TERM GOAL #2   Title  Pt will improve BERG balance test to 51/56 in order to indicate decreased fall risk.     Status  New      PT SHORT TERM GOAL #3   Title  Pt will perform TUG <13.5 secs without AD in order to indicate decreased fall risk.     Status  New      PT SHORT TERM GOAL #4   Title  Pt will ambulate over unlevel outdoor surfaces (paved and grass) with  SPC at mod I level in order to indicate improved functional and community independence.     Status  New      PT SHORT TERM GOAL #5   Title  Pt will improve gait speed to >/=2.62 ft/sec with LRAD in order to indicate  improved community ambulation.     Status  New        PT Long Term Goals - 12/18/17 1524      PT LONG TERM GOAL #1   Title  Pt will be independent with final HEP in order to indicate improved balance and coordination.  (Target Date: 02/16/18-updated to relfect 3 week delay in start)    Time  8    Period  Weeks    Status  New    Target Date  02/16/18      PT LONG TERM GOAL #2   Title  Pt will perform FGA as appropriate and improve score by 4 points.      Status  New      PT LONG TERM GOAL #3   Title  Pt will improve gait speed to 3.12 ft/sec without AD in order to indicate decreased fall risk and improved efficiency of gait.     Status  New      PT LONG TERM GOAL #4   Title  Pt will ambulate >1000' over varying outdoor surfaces without AD at independent level in order to indicate safe return to leisure and community activity.      Status  New      PT LONG TERM GOAL #5   Title  Pt will be able to carry 20# for 20' demonstrating safe body mechanics when lifting and placing to ground in order to indicate safe return to work.     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      Status  New            Plan - 12/18/17 1521    Clinical Impression Statement  Skilled session focused on improving gait quality with appropriately sized SPC, coordination and improved proprioceptive feedback with open and closed chain exercises to add to HEP.  Also reviewed outdoor gait with use of his personal knee brace (to prevent knee recurvatum) which does seem to assist, but still has intermittent knee hyperextension with fatigue  and uneven surfaces.  Will continue to address.      Rehab Potential  Good    PT Frequency  2x / week    PT Duration  8 weeks    PT Treatment/Interventions  ADLs/Self Care Home Management;Aquatic Therapy;Electrical Stimulation;DME Instruction;Gait training;Stair training;Functional mobility training;Therapeutic activities;Therapeutic exercise;Balance training;Neuromuscular re-education;Patient/family education;Vestibular    PT Next Visit Plan  Assess vestibular deficits as able (VOR), initaite HEP for high level balance and coordination (L SLS), gait with SPC, LLE coordination and knee control    PT Home Exercise Plan  Will have to recert at end of POC to reflect 3 week delay in start    Consulted and Agree with Plan of Care  Patient       Patient will benefit from skilled therapeutic intervention in order to improve the following deficits and impairments:  Abnormal gait, Decreased activity tolerance, Decreased balance, Decreased coordination, Decreased endurance, Decreased mobility, Decreased knowledge of use of DME, Dizziness, Impaired perceived functional ability, Impaired vision/preception  Visit Diagnosis: Ataxia  Other lack of coordination  Unsteadiness on feet  Other abnormalities of gait and mobility  Muscle weakness (generalized)     Problem List Patient Active Problem List   Diagnosis Date Noted  .  PFO (patent foramen ovale) 12/03/2017  . Cerebellar ataxia (HCC) 11/14/2017  . Cerebellar infarct (HCC)   . Essential hypertension   . Cerebellar stroke (HCC) 11/12/2017  . Nausea and vomiting 11/12/2017  . Dizziness and giddiness 11/12/2017  . Hyperglycemia 11/12/2017  . Leukocytosis 11/12/2017  . Alcohol use 11/12/2017    Harriet Butte, PT, MPT [facility] [address] [place], [place], [address] Phone: [phone]   Fax:  [phone] 12/18/17, 3:28 PM   Name: John Mcbride MRN: [id number] Date of Birth:  08/28/1965

## 2017-12-18 NOTE — Patient Instructions (Signed)
WALKING  Walking is a great form of exercise to increase your strength, endurance and overall fitness.  A walking program can help you start slowly and gradually build endurance as you go.  Everyone's ability is different, so each person's starting point will be different.  You do not have to follow them exactly.  The are just samples. You should simply find out what's right for you and stick to that program.   In the beginning, you'll start off walking 2-3 times a day for short distances.  As you get stronger, you'll be walking further at just 1-2 times per day.  A. You Can Walk For A Certain Length Of Time Each Day    Walk 5-8 minutes 2-3 times per day.  Increase 1-2 minutes every 5-7 days (3 times per day).  Work up to 25-30 minutes (1-2 times per day).   Example:   Day 1-2 5-8 minutes 3 times per day   Day 7-8 7-10 minutes 2-3 times per day   Day 13-14 11-14 minutes 1-2 times per day  B. You Can Walk For a Certain Distance Each Day     Distance can be substituted for time.    Example:   3 trips to mailbox (at road)   3 trips to corner of block   3 trips around the block  C. Go to local [employer] and use the track.    Walk for distance ____ around track  Or time ____ minutes  Please only do the exercises that your therapist has initialed and dated  Stairs at the house:  Do stairs using one rail, one foot over the other going forwards each direction.  Do 4 reps at a time and do 2 times per day.   SINGLE LIMB STANCE    Stance: single leg on floor. Raise leg. Hold _15__ seconds. Repeat with other leg. _3__ reps per set, _1-2__ sets per day, _5-7__ days per week  Copyright  [employer]. All rights reserved.     Toe Taps on Cones  Pick one foot off of the ground and tap it on a plastic cup placed on floor in front of your foot without knocking them over. Lower you foot back to the ground and tap the cones/cups with your other foot. Do 10 taps with the right leg, then move cup  and tap x 10 reps with the left leg.  Go slow and have as much control as possible.

## 2017-12-18 NOTE — Therapy (Signed)
[facility] [address] [place], [place], [address] Phone: [phone]   Fax:  [phone]  Occupational Therapy Treatment  Patient Details  Name: John Mcbride MRN: [id number] Date of Birth: 1966/02/03 Referring Provider: Dr. Claudette Laws   Encounter Date: 12/18/2017  OT End of Session - 12/18/17 1537    Visit Number  2    Number of Visits  10    Date for OT Re-Evaluation  12/30/17    Authorization Type  BCBS - pt has 30 visits combined for PT and OT. Pt wishes to use 20 visits for PT and 10 visits for OT    OT Start Time  1445    OT Stop Time  1530    OT Time Calculation (min)  45 min    Activity Tolerance  Patient tolerated treatment well    Behavior During Therapy  [facility] for tasks assessed/performed       Past Medical History:  Diagnosis Date  . Heart murmur     Past Surgical History:  Procedure Laterality Date  . PATENT FORAMEN OVALE(PFO) CLOSURE N/A 12/04/2017   Procedure: PATENT FORAMEN OVALE (PFO) CLOSURE;  Surgeon: [name], MD;  Location: [facility] INVASIVE CV LAB;  Service: Cardiovascular;  Laterality: N/A;  . TEE WITHOUT CARDIOVERSION N/A 11/14/2017   Procedure: TRANSESOPHAGEAL ECHOCARDIOGRAM (TEE);  Surgeon: Pricilla Riffle, MD;  Location: [facility] ENDOSCOPY;  Service: Cardiovascular;  Laterality: N/A;  bubble study    There were no vitals filed for this visit.  Subjective Assessment - 12/18/17 1449    Pertinent History  Pt with cerebellar CVA with ataxia on 11/12/2017.  Discharged home 11/21/2017 after inpt rehab stay.  PMH: HLD, possible alcohol use, Heart murmur    Patient Stated Goals  Get to where I can walk a little better. Staying at my parents right now.    Currently in Pain?  No/denies                   OT Treatments/Exercises (OP) - 12/18/17 0001      Exercises   Exercises  Hand      Hand Exercises   Other Hand Exercises  Pt issued coordination and putty HEP. See pt  instructions for details. Pt demo each      Standing with back against counter for fall prevention while dribbling ball for gross motor coordination and varying times/speeds of dribbling ball from fast to/from slow.  Seated: tossing large ball to therapist with emphasis on same force b/t hands to throw ball.  Seated: Reaching at various distances and heights to place checkers into Connect 4 slots to work on control and force behind movement of LUE.        OT Education - 12/18/17 1508    Education Details  coordination and putty HEP     Person(s) Educated  Patient    Methods  Explanation;Demonstration;Handout    Comprehension  Verbalized understanding;Returned demonstration       OT Short Term Goals - 12/18/17 1537      OT SHORT TERM GOAL #1   Title  Pt will be mod I with HEP (LUE coordination, grip strength, balance) - 12/23/2017    Status  On-going      OT SHORT TERM GOAL #2   Title  Pt will demonstrate improved fine motor coordination for LUE evidenced by decreasing time on 9 hole peg by 10 seconds (baseline = 51.30) to assist with self care tasks    Status  On-going      OT SHORT TERM GOAL #3   Title  Pt will demonstrate improved grip strength by at least 5 pounds to assist with picking up heavy objects in prep to return to work    Status  On-going      OT SHORT TERM GOAL #4   Title  Pt will demonstrate ability to do bilateral overhead tasks in standing with normal BOS with close supervision (i.e putting away dishes on top shelf of cabinet)    Status  New      OT SHORT TERM GOAL #5   Title  Further assess visual vestibular integration and set goal prn    Status  New      OT SHORT TERM GOAL #6   Title  Pt will be able to pick up light item from low shelf with no more than supervision    Status  New        OT Long Term Goals - 11/25/17 1605      OT LONG TERM GOAL #1   Title  Pt will be mod I with upgraded HEP - 12/30/2017 (pt has 30 visits combined for PT and OT and  wishes to use 10 visits for OT    Status  New      OT LONG TERM GOAL #2   Title  Pt will demonstrate improved coordination LUE as evidenced by decreasing time on 9 hole peg by at least 13 seconds 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     Status  New            Plan - 12/18/17 1538    Clinical Impression Statement  Pt progressing with fine motor and gross motor coordination. Pt still presents w/ ataxia but improved.     Occupational Profile and client history currently impacting functional performance  son, friend, employee, father. PMH: HLD, possible alcohol use, heart murmur    Occupational performance deficits (Please refer to evaluation for details):  ADL's;IADL's;Work;Leisure;Social Participation    Rehab Potential  Good    OT Frequency  2x / week    OT Duration  --   5 weeks   OT Treatment/Interventions  Self-care/ADL training;Aquatic Therapy;DME and/or AE instruction;Neuromuscular education;Therapeutic exercise;Building services engineer;Therapeutic activities;Balance training;Patient/family education    Plan  NMR for balance, functional mobility, LUE control    Consulted and Agree with Plan of Care  Patient       Patient will benefit from skilled therapeutic intervention in order to improve the following deficits and impairments:  Abnormal gait, Decreased balance, Decreased coordination, Decreased knowledge of use of DME, Decreased mobility, Difficulty walking, Decreased strength, Impaired UE functional use  Visit Diagnosis: Ataxia  Other lack of  coordination  Unsteadiness on feet    Problem List Patient Active Problem List   Diagnosis Date Noted  . PFO (patent foramen ovale) 12/03/2017  . Cerebellar ataxia (HCC) 11/14/2017  . Cerebellar infarct (HCC)   .  Essential hypertension   . Cerebellar stroke (HCC) 11/12/2017  . Nausea and vomiting 11/12/2017  . Dizziness and giddiness 11/12/2017  . Hyperglycemia 11/12/2017  . Leukocytosis 11/12/2017  . Alcohol use 11/12/2017    Kelli ChurnBallie, Lorrinda Ramstad Johnson, OTR/L 12/18/2017, 3:40 PM  [facility] [address] [place], [place], [address] Phone: [phone]   Fax:  [phone]  Name: John Mcbride MRN: [id number] Date of Birth: 04-21-1965

## 2017-12-18 NOTE — Patient Instructions (Signed)
  Coordination Activities  Perform the following activities for 15 minutes 1-2 times per day with left hand(s).   Rotate ball in fingertips (clockwise and counter-clockwise x 3 full revolutions each way).  Toss ball between hands.  Toss ball in air and catch with the same hand.  Flip cards 1 at a time as fast as you can.  Deal cards with your thumb (Hold deck in hand and push card off top with thumb).  Rotate one card in hand (clockwise and counter-clockwise).  Pick up coins one at a time until you get 5 in your hand, then move coins from palm to fingertips to stack one at a time. (3 stacks of 5)   Screw together nuts and bolts, then unfasten.  1. Grip Strengthening (Resistive Putty)   Squeeze putty using thumb and all fingers. Repeat _20___ times. Do __2__ sessions per day.   2. Roll putty into tube on table and pinch between first two fingers and thumb x 10 reps. Do 2 sessions per day

## 2017-12-19 ENCOUNTER — Encounter: Payer: Self-pay | Admitting: Occupational Therapy

## 2017-12-19 ENCOUNTER — Encounter: Payer: Self-pay | Admitting: Physical Therapy

## 2017-12-19 ENCOUNTER — Ambulatory Visit: Payer: BLUE CROSS/BLUE SHIELD | Admitting: Physical Therapy

## 2017-12-19 ENCOUNTER — Ambulatory Visit: Payer: BLUE CROSS/BLUE SHIELD | Admitting: Occupational Therapy

## 2017-12-19 DIAGNOSIS — R270 Ataxia, unspecified: Secondary | ICD-10-CM

## 2017-12-19 DIAGNOSIS — M6281 Muscle weakness (generalized): Secondary | ICD-10-CM

## 2017-12-19 DIAGNOSIS — R278 Other lack of coordination: Secondary | ICD-10-CM

## 2017-12-19 DIAGNOSIS — R41842 Visuospatial deficit: Secondary | ICD-10-CM

## 2017-12-19 DIAGNOSIS — R2681 Unsteadiness on feet: Secondary | ICD-10-CM

## 2017-12-19 DIAGNOSIS — R2689 Other abnormalities of gait and mobility: Secondary | ICD-10-CM | POA: Diagnosis not present

## 2017-12-19 NOTE — Therapy (Signed)
[facility] [address] [place], [place], [address] Phone: [phone]   Fax:  [phone]  Physical Therapy Treatment  Patient Details  Name: John SidleDavid G Spainhower MRN: [id number] Date of Birth: 1965-06-29 Referring Provider: Dr. Claudette LawsAndrew [name]   Encounter Date: 12/19/2017  PT End of Session - 12/19/17 1658    Visit Number  3    Number of Visits  17    Date for PT Re-Evaluation  01/27/18    Authorization Type  BCBS     Authorization - Visit Number  3    Authorization - Number of Visits  20    PT Start Time  1447    PT Stop Time  1528    PT Time Calculation (min)  41 min    Activity Tolerance  Patient tolerated treatment well    Behavior During Therapy  [facility] for tasks assessed/performed       Past Medical History:  Diagnosis Date  . Heart murmur     Past Surgical History:  Procedure Laterality Date  . PATENT FORAMEN OVALE(PFO) CLOSURE N/A 12/04/2017   Procedure: PATENT FORAMEN OVALE (PFO) CLOSURE;  Surgeon: Tonny Bollmanooper, Michael, MD;  [facility];  Service: Cardiovascular;  Laterality: N/A;  . TEE WITHOUT CARDIOVERSION N/A 11/14/2017   Procedure: TRANSESOPHAGEAL ECHOCARDIOGRAM (TEE);  Surgeon: Pricilla Riffleoss, Paula V, MD;  [facility];  Service: Cardiovascular;  Laterality: N/A;  bubble study    There were no vitals filed for this visit.  Subjective Assessment - 12/19/17 1652    Subjective  Doing well - has a new cane - excited to go to the beach this weekend    Pertinent History  HTN, alcohol use    Limitations  House hold activities;Walking    Patient Stated Goals  "Be more balanced and walk without the walker."     Currently in Pain?  No/denies    Pain Score  0-No pain                       [facility] Adult PT Treatment/Exercise - 12/19/17 0001      Ambulation/Gait   Ambulation/Gait  Yes    Ambulation/Gait Assistance  5: Supervision;4: Min guard    Ambulation/Gait Assistance  Details  ambulating with SPC and use of knee brace - heavy heel strike with seemingly forced extension of L LE - decreased stnace time on R LE; up/down paved inclines and ramps with cueing for sequrencing and safety    Ambulation Distance (Feet)  --   outdoor ambualtion ~ 400 feet   Assistive device  Straight cane    Gait Pattern  Step-through pattern;Decreased arm swing - left;Decreased stance time - left;Ataxic;Lateral hip instability;Wide base of support    Ambulation Surface  Level;Outdoor;Paved          Balance Exercises - 12/19/17 1653      Balance Exercises: Standing   Standing Eyes Opened  Narrow base of support (BOS);Foam/compliant surface;4 reps;20 secs   horizontal and vertical    Standing Eyes Closed  Narrow base of support (BOS);Foam/compliant surface;5 reps;10 secs    Rockerboard  Anterior/posterior;Lateral;20 seconds;5 reps;Intermittent UE support    Balance Beam  black beam: anterio/posterior toe taps from beam x 10 each direction/LE;    Tandem Gait  Forward;4 reps;Intermittent upper extremity support    Retro Gait  4 reps   intermittent UE support    Marching Limitations  marching in place on AirEx x 15 each  LE    Other Standing Exercises  toe taps to 6" steps standing on AirEx x 15 each LE          PT Short Term Goals - 12/18/17 1523      PT SHORT TERM GOAL #1   Title  Pt will initiate HEP in order to indicate improved balance and coordination. (Target Date: 01/17/18-updated to reflect 3 week delay in start)    Time  4    Period  Weeks    Status  New    Target Date  01/17/18      PT SHORT TERM GOAL #2   Title  Pt will improve BERG balance test to 51/56 in order to indicate decreased fall risk.     Status  New      PT SHORT TERM GOAL #3   Title  Pt will perform TUG <13.5 secs without AD in order to indicate decreased fall risk.     Status  New      PT SHORT TERM GOAL #4   Title  Pt will ambulate over unlevel outdoor surfaces (paved and grass) with SPC  at mod I level in order to indicate improved functional and community independence.     Status  New      PT SHORT TERM GOAL #5   Title  Pt will improve gait speed to >/=2.62 ft/sec with LRAD in order to indicate improved community ambulation.     Status  New        PT Long Term Goals - 12/18/17 1524      PT LONG TERM GOAL #1   Title  Pt will be independent with final HEP in order to indicate improved balance and coordination.  (Target Date: 02/16/18-updated to relfect 3 week delay in start)    Time  8    Period  Weeks    Status  New    Target Date  02/16/18      PT LONG TERM GOAL #2   Title  Pt will perform FGA as appropriate and improve score by 4 points.      Status  New      PT LONG TERM GOAL #3   Title  Pt will improve gait speed to 3.12 ft/sec without AD in order to indicate decreased fall risk and improved efficiency of gait.     Status  New      PT LONG TERM GOAL #4   Title  Pt will ambulate >1000' over varying outdoor surfaces without AD at independent level in order to indicate safe return to leisure and community activity.      Status  New      PT LONG TERM GOAL #5   Title  Pt will be able to carry 20# for 20' demonstrating safe body mechanics when lifting and placing to ground in order to indicate safe return to work.     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      Status  New            Plan - 12/19/17 1658    Clinical Impression Statement  PT session today focusing on balance with narrow BOS as well as on compliant surfaces. Patient continues to have a preference for L  knee hyperextension with diffculty maintianing a neutral knee in static/dynamic stance. Patient with heavy posterior weight shift on compliant surfaces along with difficulty coordinating L LE during dynamic tasks requiring  verbal/tactile cueing for appropriate motor control. New cane at appropraite height for patient. WIll continue to benefit from skilled PT to address deficts and progress towards goals.     Rehab Potential  Good    PT Frequency  2x / week    PT Duration  8 weeks    PT Treatment/Interventions  ADLs/Self Care Home Management;Aquatic Therapy;Electrical Stimulation;DME Instruction;Gait training;Stair training;Functional mobility training;Therapeutic activities;Therapeutic exercise;Balance training;Neuromuscular re-education;Patient/family education;Vestibular    PT Next Visit Plan  Assess vestibular deficits as able (VOR), initaite HEP for high level balance and coordination (L SLS), gait with SPC, LLE coordination and knee control    PT Home Exercise Plan  Will have to recert at end of POC to reflect 3 week delay in start    Consulted and Agree with Plan of Care  Patient       Patient will benefit from skilled therapeutic intervention in order to improve the following deficits and impairments:  Abnormal gait, Decreased activity tolerance, Decreased balance, Decreased coordination, Decreased endurance, Decreased mobility, Decreased knowledge of use of DME, Dizziness, Impaired perceived functional ability, Impaired vision/preception  Visit Diagnosis: Ataxia  Other lack of coordination  Unsteadiness on feet  Other abnormalities of gait and mobility  Muscle weakness (generalized)     Problem List Patient Active Problem List   Diagnosis Date Noted  . PFO (patent foramen ovale) 12/03/2017  . Cerebellar ataxia (HCC) 11/14/2017  . Cerebellar infarct (HCC)   . Essential hypertension   . Cerebellar stroke (HCC) 11/12/2017  . Nausea and vomiting 11/12/2017  . Dizziness and giddiness 11/12/2017  . Hyperglycemia 11/12/2017  . Leukocytosis 11/12/2017  . Alcohol use 11/12/2017     Kipp Laurence, PT, DPT Supplemental Physical Therapist 12/19/17 5:02 PM Pager: [phone] Office:  [phone]  [facility] [address] [place], [place], [address] Phone: [phone]   Fax:  [phone]  Name: CORDARRO SPINNATO MRN: [id number] Date of Birth: 16-Feb-1966

## 2017-12-19 NOTE — Therapy (Signed)
[facility] [address] [place], [place], [address] Phone: [phone]   Fax:  [phone]  Occupational Therapy Treatment  Patient Details  Name: John Mcbride MRN: [id number] Date of Birth: 1966-01-01 Referring Provider: Dr. Claudette Laws   Encounter Date: 12/19/2017  OT End of Session - 12/19/17 1703    Visit Number  3    Number of Visits  10    Date for OT Re-Evaluation  12/30/17    Authorization Type  BCBS - pt has 30 visits combined for PT and OT. Pt wishes to use 20 visits for PT and 10 visits for OT    OT Start Time  1535    OT Stop Time  1615    OT Time Calculation (min)  40 min    Activity Tolerance  Patient tolerated treatment well    Behavior During Therapy  [facility] for tasks assessed/performed       Past Medical History:  Diagnosis Date  . Heart murmur     Past Surgical History:  Procedure Laterality Date  . PATENT FORAMEN OVALE(PFO) CLOSURE N/A 12/04/2017   Procedure: PATENT FORAMEN OVALE (PFO) CLOSURE;  Surgeon: [name], MD;  Location: [facility] INVASIVE CV LAB;  Service: Cardiovascular;  Laterality: N/A;  . TEE WITHOUT CARDIOVERSION N/A 11/14/2017   Procedure: TRANSESOPHAGEAL ECHOCARDIOGRAM (TEE);  Surgeon: Pricilla Riffle, MD;  Location: [facility] ENDOSCOPY;  Service: Cardiovascular;  Laterality: N/A;  bubble study    There were no vitals filed for this visit.  Subjective Assessment - 12/19/17 1544    Subjective   It takes me twice as long to get ready     Pertinent History  Pt with cerebellar CVA with ataxia on 11/12/2017.  Discharged home 11/21/2017 after inpt rehab stay.  PMH: HLD, possible alcohol use, Heart murmur    Patient Stated Goals  Get to where I can walk a little better. Staying at my parents right now.    Currently in Pain?  No/denies                   OT Treatments/Exercises (OP) - 12/19/17 0001      ADLs   ADL Comments  Patient wants to eventually return to his own home  - asking about plan to reduce supervision.  Patient currently does not go longer than 4 hours without his room mate home.        Neurological Re-education Exercises   Other Exercises 1  Neuromuscular reeducation to address gross motor coordiantion, and graded movement in limbs.  Patient walks with ataxic gait - fast, ballistic swing of left leg - wearing knee brace to reduce knee hyperextension - which overtime causes him discomfort.  Worked on reducing his speed, equalling his step length, and maintaining neutral pelvis rotation with walking to help balance muscle activity in LEs'.  Worked on tossig and catching a Biomedical engineer while walking.  Patient able to coordinate right and left hand together, but had difficulty coordianting upper body with lower body to walk and toss/catch a large or two smaller balls.  Patient able to report.  Patient needed consistent assistance to maintain mass over base of support and for proper weight shift during this task.  Worked on gross motor coordination for functional task - getting up from floor without use of furniture - patient had no difficulty.      Other Exercises 2  Worked on in hand manipulation skills - patient will add to current HEP.  OT Education - 12/19/17 1703    Education Details  discussed supervision, and plan to reduce need for supervision    Methods  Explanation    Comprehension  Verbalized understanding       OT Short Term Goals - 12/18/17 1537      OT SHORT TERM GOAL #1   Title  Pt will be mod I with HEP (LUE coordination, grip strength, balance) - 12/23/2017    Status  On-going      OT SHORT TERM GOAL #2   Title  Pt will demonstrate improved fine motor coordination for LUE evidenced by decreasing time on 9 hole peg by 10 seconds (baseline = 51.30) to assist with self care tasks    Status  On-going      OT SHORT TERM GOAL #3   Title  Pt will demonstrate improved grip strength by at least 5 pounds to assist  with picking up heavy objects in prep to return to work    Status  On-going      OT SHORT TERM GOAL #4   Title  Pt will demonstrate ability to do bilateral overhead tasks in standing with normal BOS with close supervision (i.e putting away dishes on top shelf of cabinet)    Status  New      OT SHORT TERM GOAL #5   Title  Further assess visual vestibular integration and set goal prn    Status  New      OT SHORT TERM GOAL #6   Title  Pt will be able to pick up light item from low shelf with no more than supervision    Status  New        OT Long Term Goals - 11/25/17 1605      OT LONG TERM GOAL #1   Title  Pt will be mod I with upgraded HEP - 12/30/2017 (pt has 30 visits combined for PT and OT and wishes to use 10 visits for OT    Status  New      OT LONG TERM GOAL #2   Title  Pt will demonstrate improved coordination LUE as evidenced by decreasing time on 9 hole peg by at least 13 seconds 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     Status  New            Plan - 12/19/17 1703    Clinical Impression Statement  Patient is showing improved understanding of more controlled movement patterns.      Occupational Profile and client history currently impacting functional performance  son, friend, employee, father. PMH: HLD, possible alcohol use, heart murmur    Occupational performance deficits (Please refer to evaluation for details):  ADL's;IADL's;Work;Leisure;Social Participation    Rehab Potential  Good    OT Frequency  2x / week    OT Treatment/Interventions  Self-care/ADL  training;Aquatic Therapy;DME and/or AE instruction;Neuromuscular education;Therapeutic exercise;Building services engineer;Therapeutic activities;Balance training;Patient/family education  Plan  NMR for balance, functional mobility, LUE control    Clinical Decision Making  Several treatment options, min-mod task modification necessary    Consulted and Agree with Plan of Care  Patient       Patient will benefit from skilled therapeutic intervention in order to improve the following deficits and impairments:  Abnormal gait, Decreased balance, Decreased coordination, Decreased knowledge of use of DME, Decreased mobility, Difficulty walking, Decreased strength, Impaired UE functional use  Visit Diagnosis: Ataxia  Other lack of coordination  Unsteadiness on feet  Muscle weakness (generalized)  Visuospatial deficit    Problem List Patient Active Problem List   Diagnosis Date Noted  . PFO (patent foramen ovale) 12/03/2017  . Cerebellar ataxia (HCC) 11/14/2017  . Cerebellar infarct (HCC)   . Essential hypertension   . Cerebellar stroke (HCC) 11/12/2017  . Nausea and vomiting 11/12/2017  . Dizziness and giddiness 11/12/2017  . Hyperglycemia 11/12/2017  . Leukocytosis 11/12/2017  . Alcohol use 11/12/2017    Collier SalinaGellert, Keondria Siever M 12/19/2017, 5:05 PM  [facility] [address] [place], [place], [address] Phone: [phone]   Fax:  [phone]  Name: John Mcbride MRN: [id number] Date of Birth: [birth year]

## 2017-12-22 ENCOUNTER — Encounter: Payer: BLUE CROSS/BLUE SHIELD | Admitting: Occupational Therapy

## 2017-12-22 ENCOUNTER — Ambulatory Visit: Payer: BLUE CROSS/BLUE SHIELD | Admitting: Rehabilitation

## 2017-12-25 ENCOUNTER — Ambulatory Visit: Payer: BLUE CROSS/BLUE SHIELD | Admitting: Occupational Therapy

## 2017-12-25 ENCOUNTER — Ambulatory Visit: Payer: BLUE CROSS/BLUE SHIELD | Admitting: Physical Therapy

## 2017-12-29 ENCOUNTER — Ambulatory Visit: Payer: BLUE CROSS/BLUE SHIELD | Admitting: Occupational Therapy

## 2017-12-29 ENCOUNTER — Encounter: Payer: Self-pay | Admitting: Occupational Therapy

## 2017-12-29 ENCOUNTER — Ambulatory Visit: Payer: BLUE CROSS/BLUE SHIELD | Admitting: Rehabilitation

## 2017-12-29 DIAGNOSIS — R2681 Unsteadiness on feet: Secondary | ICD-10-CM

## 2017-12-29 DIAGNOSIS — R2689 Other abnormalities of gait and mobility: Secondary | ICD-10-CM | POA: Diagnosis not present

## 2017-12-29 DIAGNOSIS — R278 Other lack of coordination: Secondary | ICD-10-CM | POA: Diagnosis not present

## 2017-12-29 DIAGNOSIS — R270 Ataxia, unspecified: Secondary | ICD-10-CM

## 2017-12-29 DIAGNOSIS — R41842 Visuospatial deficit: Secondary | ICD-10-CM | POA: Diagnosis not present

## 2017-12-29 DIAGNOSIS — M6281 Muscle weakness (generalized): Secondary | ICD-10-CM

## 2017-12-29 NOTE — Patient Instructions (Addendum)
Coordination Activities  Perform the following activities for 15 minutes 1-2 times per day with left hand(s).   Rotate ball in fingertips (clockwise and counter-clockwise x 3 full revolutions each way).  Toss ball between hands.  Toss ball in air and catch with the same hand.  Flip cards 1 at a time as fast as you can.  Deal cards with your thumb (Hold deck in hand and push card off top with thumb).  Rotate one card in hand (clockwise and counter-clockwise).  Pick up coins one at a time until you get 5 in your hand, then move coins from palm to fingertips to stack one at a time. (3 stacks of 5)   Screw together nuts and bolts, then unfasten.  1. Grip Strengthening (Resistive Putty)   Squeeze putty using thumb and all fingers. Repeat _20___ times. Do __2__ sessions per day.   2. Roll putty into tube on table and pinch between first two fingers and thumb x 10 reps. Do 2 sessions per day  Access Code: [email or web address]  URL: [email or web address]  Date: 12/29/2017  Prepared by: Mackie Pai   Exercises  Quadruped Cat Camel - 10 reps - 1 sets - 5 seconds hold - 1x daily - 7x weekly Quadruped Leg Lifts - 10 reps - 1 sets - 5 seconds hold - 1x daily - 7x weekly  Quadruped Alternating Arm Lift - 10 reps - 1 sets - 5 seconds hold - 1x daily - 7x weekly

## 2017-12-29 NOTE — Therapy (Signed)
[facility] [address] [place], [place], [address] Phone: [phone]   Fax:  [phone]  Occupational Therapy Treatment  Patient Details  Name: John Mcbride MRN: [id number] Date of Birth: Mar 04, 1966 No data recorded  Encounter Date: 12/29/2017  OT End of Session - 12/29/17 1238    Visit Number  4    Number of Visits  10    Date for OT Re-Evaluation  01/13/18    Authorization Type  BCBS - pt has 30 visits combined for PT and OT. Pt wishes to use 20 visits for PT and 10 visits for OT    OT Start Time  1101    OT Stop Time  1145    OT Time Calculation (min)  44 min    Activity Tolerance  Patient tolerated treatment well       Past Medical History:  Diagnosis Date  . Heart murmur     Past Surgical History:  Procedure Laterality Date  . PATENT FORAMEN OVALE(PFO) CLOSURE N/A 12/04/2017   Procedure: PATENT FORAMEN OVALE (PFO) CLOSURE;  Surgeon: [name], MD;  Location: [facility] INVASIVE CV LAB;  Service: Cardiovascular;  Laterality: N/A;  . TEE WITHOUT CARDIOVERSION N/A 11/14/2017   Procedure: TRANSESOPHAGEAL ECHOCARDIOGRAM (TEE);  Surgeon: Pricilla Riffle, MD;  Location: [facility] ENDOSCOPY;  Service: Cardiovascular;  Laterality: N/A;  bubble study    There were no vitals filed for this visit.  Subjective Assessment - 12/29/17 1107    Subjective   My vacation was good - I walked on the beach    Pertinent History  Pt with cerebellar CVA with ataxia on 11/12/2017.  Discharged home 11/21/2017 after inpt rehab stay.  PMH: HLD, possible alcohol use, Heart murmur    Patient Stated Goals  Get to where I can walk a little better. Staying at my parents right now.    Currently in Pain?  No/denies                   OT Treatments/Exercises (OP) - 12/29/17 0001      ADLs   ADL Comments  Assessed STG"s - see goal section for goals status. Pt has only had 4 appts as of today however was evaluated on 11/27/2017 (pt has  been on vacation). Pt reports he "occassionally" does his HEP for LUE grip strength and coordination - discussed importance of  completing HEP consistently in order to make gains and reach goals - reprinted HEP and reviewed briefly with pt - pt verbalized understanding.       Neurological Re-education Exercises   Other Exercises 1  Neuro re ed to address proximal LUE control, postural alignment and control and decreasing ataxia and improve LUE and LLE control throught weight bearing activities.  Worked in quadruped, sit to squat, kneeling, half kneeling and standing with emphasis on dynamic stability on L side with improved alignment.  Pt with improved LLE and LUE control at end of session. Upgraded HEP to include weight bearing activities in quadruped at home (pt is able to get up and down from floor.  Pt able to return demonstate.             OT Education - 12/29/17 1233    Education Details  reviewed prior HEP for grip and coordination, upgraded HEP to included activities in quadruped    Person(s) Educated  Patient    Methods  Explanation;Demonstration;Handout    Comprehension  Verbalized understanding;Returned demonstration  OT Short Term Goals - 12/29/17 1233      OT SHORT TERM GOAL #1   Title  Pt will be mod I with HEP (LUE coordination, grip strength, balance) - 01/06/2018 (goal date adjusted based on pt's attendance)    Status  On-going      OT SHORT TERM GOAL #2   Title  Pt will demonstrate improved fine motor coordination for LUE evidenced by decreasing time on 9 hole peg by 10 seconds (baseline = 51.30) to assist with self care tasks    Status  On-going      OT SHORT TERM GOAL #3   Title  Pt will demonstrate improved grip strength by at least 5 pounds to assist with picking up heavy objects in prep to return to work    Status  On-going      OT SHORT TERM GOAL #4   Title  Pt will demonstrate ability to do bilateral overhead tasks in standing with normal BOS with  close supervision (i.e putting away dishes on top shelf of cabinet)    Status  On-going      OT SHORT TERM GOAL #5   Title  Further assess visual vestibular integration and set goal prn    Status  On-going      OT SHORT TERM GOAL #6   Title  Pt will be able to pick up light item from low shelf with no more than supervision    Status  New        OT Long Term Goals - 12/29/17 1235      OT LONG TERM GOAL #1   Title  Pt will be mod I with upgraded HEP - 01/13/2018 date adjusted based on pt's attendance  (pt has 30 visits combined for PT and OT and wishes to use 10 visits for OT    Status  New      OT LONG TERM GOAL #2   Title  Pt will demonstrate improved coordination LUE as evidenced by decreasing time on 9 hole peg by at least 13 seconds 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     Status  New            Plan - 12/29/17 1236    Clinical Impression Statement  Pt with slow progress - pt has only attended 4 sessions since eval due to vacations.  Reinforced HEP and pt verbalized understanding.     Occupational Profile and client history currently impacting functional performance  son, friend, employee, father. PMH: HLD, possible alcohol use, heart murmur    Occupational performance deficits (Please refer to evaluation for details):  ADL's;IADL's;Work;Leisure;Social Participation    Rehab Potential  Good    OT Frequency  2x / week    OT Duration  --   5 weeks - pt only has 30 visits combined between PT and OT   OT Treatment/Interventions  Self-care/ADL training;Aquatic Therapy;DME  and/or AE instruction;Neuromuscular education;Therapeutic  exercise;Building services engineer;Therapeutic activities;Balance training;Patient/family education    Plan Assess visual/vestibular integration,  NMR for balance, functional mobility, LUE control    Consulted and Agree with Plan of Care  Patient       Patient will benefit from skilled therapeutic intervention in order to improve the following deficits and impairments:  Abnormal gait, Decreased balance, Decreased coordination, Decreased knowledge of use of DME, Decreased mobility, Difficulty walking, Decreased strength, Impaired UE functional use  Visit Diagnosis: Ataxia  Other lack of coordination  Unsteadiness on feet  Muscle weakness (generalized)  Visuospatial deficit    Problem List Patient Active Problem List   Diagnosis Date Noted  . PFO (patent foramen ovale) 12/03/2017  . Cerebellar ataxia (HCC) 11/14/2017  . Cerebellar infarct (HCC)   . Essential hypertension   . Cerebellar stroke (HCC) 11/12/2017  . Nausea and vomiting 11/12/2017  . Dizziness and giddiness 11/12/2017  . Hyperglycemia 11/12/2017  . Leukocytosis 11/12/2017  . Alcohol use 11/12/2017    Norton Pastel, OTR/L 12/29/2017, 12:40 PM  [facility] [address] [place], [place], [address] Phone: [phone]   Fax:  [phone]  Name: JUANA MONTINI MRN: [id number] Date of Birth: January 18, 1966

## 2018-01-01 ENCOUNTER — Telehealth: Payer: Self-pay

## 2018-01-01 ENCOUNTER — Encounter: Payer: Self-pay | Admitting: Rehabilitation

## 2018-01-01 ENCOUNTER — Encounter: Payer: Self-pay | Admitting: Occupational Therapy

## 2018-01-01 ENCOUNTER — Ambulatory Visit: Payer: BLUE CROSS/BLUE SHIELD | Attending: Physical Medicine & Rehabilitation | Admitting: Occupational Therapy

## 2018-01-01 ENCOUNTER — Ambulatory Visit: Payer: BLUE CROSS/BLUE SHIELD | Admitting: Rehabilitation

## 2018-01-01 DIAGNOSIS — R278 Other lack of coordination: Secondary | ICD-10-CM

## 2018-01-01 DIAGNOSIS — R2681 Unsteadiness on feet: Secondary | ICD-10-CM | POA: Insufficient documentation

## 2018-01-01 DIAGNOSIS — R41842 Visuospatial deficit: Secondary | ICD-10-CM

## 2018-01-01 DIAGNOSIS — R270 Ataxia, unspecified: Secondary | ICD-10-CM

## 2018-01-01 DIAGNOSIS — R2689 Other abnormalities of gait and mobility: Secondary | ICD-10-CM | POA: Insufficient documentation

## 2018-01-01 DIAGNOSIS — M6281 Muscle weakness (generalized): Secondary | ICD-10-CM | POA: Insufficient documentation

## 2018-01-01 NOTE — Therapy (Signed)
[facility] [address] [place], [place], [address] Phone: [phone]   Fax:  [phone]  Occupational Therapy Treatment  Patient Details  Name: John Mcbride MRN: [id number] Date of Birth: 26-Jan-1966 No data recorded  Encounter Date: 01/01/2018  OT End of Session - 01/01/18 1207    Visit Number  5    Number of Visits  10    Date for OT Re-Evaluation  01/13/18    Authorization Type  BCBS - pt has 30 visits combined for PT and OT. Pt wishes to use 20 visits for PT and 10 visits for OT    OT Start Time  1017    OT Stop Time  1100    OT Time Calculation (min)  43 min    Activity Tolerance  Patient tolerated treatment well       Past Medical History:  Diagnosis Date  . Heart murmur     Past Surgical History:  Procedure Laterality Date  . PATENT FORAMEN OVALE(PFO) CLOSURE N/A 12/04/2017   Procedure: PATENT FORAMEN OVALE (PFO) CLOSURE;  Surgeon: Sherren Mocha, MD;  Location: [place] CV LAB;  Service: Cardiovascular;  Laterality: N/A;  . TEE WITHOUT CARDIOVERSION N/A 11/14/2017   Procedure: TRANSESOPHAGEAL ECHOCARDIOGRAM (TEE);  Surgeon: Fay Records, MD;  Location: [facility] ENDOSCOPY;  Service: Cardiovascular;  Laterality: N/A;  bubble study    There were no vitals filed for this visit.  Subjective Assessment - 01/01/18 1025    Subjective   I get tired at night and I can tell my balance and everything else gets worse when this happens.     Pertinent History  Pt with cerebellar CVA with ataxia on 11/12/2017.  Discharged home 11/21/2017 after inpt rehab stay.  PMH: HLD, possible alcohol use, Heart murmur    Patient Stated Goals  Get to where I can walk a little better. Staying at my parents right now.    Currently in Pain?  No/denies                   OT Treatments/Exercises (OP) - 01/01/18 0001      Neurological Re-education Exercises   Other Exercises 1  Neuro re ed to address proximal stability and  control relative to ataxia with emphasis on heavy weight bearing using forced use approach in quadruped, standing at table, and agains the wall. Educated pt doing activites as part of his HEP as well as using these activities during the day as when ataxia increases.  Pt will need reinforcement with this concept.  Also addressed fine motor tasks with in hand manipulation and at times speed with activity.  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 Pt is at level that we can begin to incorporate work activities into therapy.      Other Exercises 2  Further assessed visual vestibular integration - at this point pt is only exhibiting mild impairment with horizontal saccadic movement to the left however is not symptomatic.                OT Short Term Goals - 01/01/18 1204      OT SHORT TERM GOAL #1   Title  Pt will be mod I with HEP (LUE coordination, grip strength, balance) - 01/06/2018 (goal date adjusted based on pt's attendance)    Status  Achieved  OT SHORT TERM GOAL #2   Title  Pt will demonstrate improved fine motor coordination for LUE evidenced by decreasing time on 9 hole peg by 10 seconds (baseline = 51.30) to assist with self care tasks    Status  Partially Met   01/01/2018  42.26     OT SHORT TERM GOAL #3   Title  Pt will demonstrate improved grip strength by at least 5 pounds to assist with picking up heavy objects in prep to return to work    Status  On-going      OT [place] #4   Title  Pt will demonstrate ability to do bilateral overhead tasks in standing with normal BOS with close supervision (i.e putting away dishes on top shelf of cabinet)    Status  On-going      OT SHORT TERM GOAL #5   Title  Further assess visual vestibular integration and set goal prn    Status  Achieved      OT SHORT TERM GOAL #6   Title  Pt will be able to pick up light  item from low shelf with no more than supervision    Status  New        OT Long Term Goals - 01/01/18 1206      OT LONG TERM GOAL #1   Title  Pt will be mod I with upgraded HEP - 01/13/2018 date adjusted based on pt's attendance  (pt has 30 visits combined for PT and OT and wishes to use 10 visits for OT    Status  New      OT LONG TERM GOAL #2   Title  Pt will demonstrate improved coordination LUE as evidenced by decreasing time on 9 hole peg by at least 13 seconds to 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     Status  New            Plan - 01/01/18 1206    Clinical Impression Statement  Pt progressing toward goals. Pt demonstating improved coordination in LUE as well as slow improvement in functional mobility and balance.     Occupational Profile and client history currently impacting functional performance  son, friend, employee, father. PMH: HLD, possible alcohol use, heart murmur    Occupational performance deficits (Please refer to evaluation for details):  ADL's;IADL's;Work;Leisure;Social Participation    Rehab Potential  Good    OT Frequency  2x / week    OT Duration  --   5 weeks   OT Treatment/Interventions  Self-care/ADL training;Aquatic Therapy;DME and/or AE instruction;Neuromuscular education;Therapeutic exercise;Therapist, nutritional;Therapeutic activities;Balance training;Patient/family education    Plan  NMR for balance, functional mobility, LUE control    Consulted and Agree with Plan of Care  Patient       Patient will benefit from skilled therapeutic  intervention in order to improve the following deficits and impairments:  Abnormal gait, Decreased balance, Decreased coordination, Decreased knowledge of  use of DME, Decreased mobility, Difficulty walking, Decreased strength, Impaired UE functional use  Visit Diagnosis: Ataxia  Other lack of coordination  Unsteadiness on feet  Muscle weakness (generalized)  Visuospatial deficit    Problem List Patient Active Problem List   Diagnosis Date Noted  . PFO (patent foramen ovale) 12/03/2017  . Cerebellar ataxia ([place]) 11/14/2017  . Cerebellar infarct ([place])   . Essential hypertension   . Cerebellar stroke ([place]) 11/12/2017  . Nausea and vomiting 11/12/2017  . Dizziness and giddiness 11/12/2017  . Hyperglycemia 11/12/2017  . Leukocytosis 11/12/2017  . Alcohol use 11/12/2017    Quay Burow, OTR/L 01/01/2018, 12:09 PM  [facility] [address] [place], [place], [address] Phone: [phone]   Fax:  [phone]  Name: John Mcbride MRN: [id number] Date of Birth: 05/10/1965

## 2018-01-01 NOTE — Therapy (Signed)
[facility] [address] [place], [place], [address] Phone: [phone]   Fax:  [phone]  Physical Therapy Treatment  Patient Details  Name: John Mcbride MRN: [id number] Date of Birth: [birth year] Referring Provider (PT): Dr. Claudette Laws   Encounter Date: 01/01/2018  PT End of Session - 01/01/18 1107    Visit Number  4    Number of Visits  17    Date for PT Re-Evaluation  01/27/18    Authorization Type  BCBS     Authorization - Visit Number  4    Authorization - Number of Visits  20    PT Start Time  1102    PT Stop Time  1147    PT Time Calculation (min)  45 min    Activity Tolerance  Patient tolerated treatment well    Behavior During Therapy  [facility] for tasks assessed/performed       Past Medical History:  Diagnosis Date  . Heart murmur     Past Surgical History:  Procedure Laterality Date  . PATENT FORAMEN OVALE(PFO) CLOSURE N/A 12/04/2017   Procedure: PATENT FORAMEN OVALE (PFO) CLOSURE;  Surgeon: [name], MD;  Location: [facility] INVASIVE CV LAB;  Service: Cardiovascular;  Laterality: N/A;  . TEE WITHOUT CARDIOVERSION N/A 11/14/2017   Procedure: TRANSESOPHAGEAL ECHOCARDIOGRAM (TEE);  Surgeon: Pricilla Riffle, MD;  Location: [facility] ENDOSCOPY;  Service: Cardiovascular;  Laterality: N/A;  bubble study    There were no vitals filed for this visit.  Subjective Assessment - 01/01/18 1106    Subjective  Doing well, reports beach trip went well. Wearing knee brace intermittently to help with pain.      Pertinent History  HTN, alcohol use    Limitations  House hold activities;Walking    Patient Stated Goals  "Be more balanced and walk without the walker."     Currently in Pain?  No/denies                       [facility] Adult PT Treatment/Exercise - 01/01/18 1115      Ambulation/Gait   Ambulation/Gait  Yes    Ambulation/Gait Assistance  5: Supervision;6: Modified independent (Device/Increase  time)    Ambulation/Gait Assistance Details  Had pt begin with ambulation around track to better assess gait without AD.  Note improved L knee control and decreased forceful L LE advancement, however still demonstrates ataxic movements at knee but with less recurvatum.  Min tactile cues for improved forward L hip protraction during stance phae of gait.      Ambulation Distance (Feet)  300 Feet    Assistive device  None    Gait Pattern  Step-through pattern;Decreased arm swing - left;Decreased stance time - left;Ataxic;Lateral hip instability;Wide base of support    Ambulation Surface  Level;Indoor      Neuro Re-ed    Neuro Re-ed Details   Began by addressing L hip extension/protraction to carryover to improved postural alignment and decreased ataxia.  Standing on ramp on blue mat with feet staggered (L behind R initially) with emphasis on L LE weight bearing with light tactile cues for improving posterior weight shift over LLE.  Progressed to LLE in front of RLE (staggered) moving forward over LLE in stance and back to RLE x 10 reps, progressing to forward advancement over LLE with RLE advancement and retrostep x 10 reps with tactile cues for posture and decreased trunk extension.  Wall bumps on foam mat with feet  approx 4-5" from wall feet apart x 5 reps, feet together x 5 reps with light tactile cues for improved postural alignment.  Progressed to // bars standing on foam airex marching in place slowly again with emphasis on L stance control, progressing to LLE in stance on airex lightly placing R foot on yoga block maintaing stance x 3 reps of 15 secs.  Also did this with ball toss with PT providing light tactile cues at L knee to reduce forceful extension while maintaining good hip extension.  R half kneeling: with co-traction of R and L LE (R knee flex moment and L hip flex moment) while performing trunk rotation (both holding item and also with reaching for PT hand) to encourage more L hip extension.  Also worked on tall>R half kneel transitions with emphasis on L hip control.  Tolerated well, but with moderate fatigue.  Ended session with open chain LLE coordination tasks: resisted LLE tapping to 1st then 2nd step with light UE support both with forward stepping then lateral stepping x 10 each with green band around L foot for resistance.               PT Education - 01/01/18 1107    Education Details  Performing cup tapping exercise with green resistance band.      Person(s) Educated  Patient    Methods  Explanation;Demonstration    Comprehension  Verbalized understanding;Returned demonstration       PT Short Term Goals - 12/18/17 1523      PT SHORT TERM GOAL #1   Title  Pt will initiate HEP in order to indicate improved balance and coordination. (Target Date: 01/17/18-updated to reflect 3 week delay in start)    Time  4    Period  Weeks    Status  New    Target Date  01/17/18      PT SHORT TERM GOAL #2   Title  Pt will improve BERG balance test to 51/56 in order to indicate decreased fall risk.     Status  New      PT SHORT TERM GOAL #3   Title  Pt will perform TUG <13.5 secs without AD in order to indicate decreased fall risk.     Status  New      PT SHORT TERM GOAL #4   Title  Pt will ambulate over unlevel outdoor surfaces (paved and grass) with SPC at mod I level in order to indicate improved functional and community independence.     Status  New      PT SHORT TERM GOAL #5   Title  Pt will improve gait speed to >/=2.62 ft/sec with LRAD in order to indicate improved community ambulation.     Status  New        PT Long Term Goals - 12/18/17 1524      PT LONG TERM GOAL #1   Title  Pt will be independent with final HEP in order to indicate improved balance and coordination.  (Target Date: 02/16/18-updated to relfect 3 week delay in start)    Time  8    Period  Weeks    Status  New    Target Date  02/16/18      PT LONG TERM GOAL #2   Title  Pt will perform  FGA as appropriate and improve score by 4 points.      Status  New      PT LONG TERM GOAL #3  Title  Pt will improve gait speed to 3.12 ft/sec without AD in order to indicate decreased fall risk and improved efficiency of gait.     Status  New      PT LONG TERM GOAL #4   Title  Pt will ambulate >1000' over varying outdoor surfaces without AD at independent level in order to indicate safe return to leisure and community activity.      Status  New      PT LONG TERM GOAL #5   Title  Pt will be able to carry 20# for 20' demonstrating safe body mechanics when lifting and placing to ground in order to indicate safe return to work.     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      Status  New            Plan - 01/01/18 1108    Clinical Impression Statement  Skilled session focused on mostly closed chain activity for LLE stability and decreased ataxia but also incorporated L open chain exercise to address coordination.  Pt moderately fatigued but tolerated well.      Rehab Potential  Good    PT Frequency  2x / week    PT Duration  8 weeks    PT Treatment/Interventions  ADLs/Self Care Home Management;Aquatic Therapy;Electrical Stimulation;DME Instruction;Gait training;Stair training;Functional mobility training;Therapeutic activities;Therapeutic exercise;Balance training;Neuromuscular re-education;Patient/family education;Vestibular    PT Next Visit Plan  continue with closed chain L motor control (quadruped/tall kneeling-esp transitions to L side sit and back without UE support).      PT Home Exercise Plan  Will have to recert at end of POC to reflect 3 week delay in start    Consulted and Agree with Plan of Care  Patient       Patient will benefit from skilled therapeutic intervention in order to improve the following deficits  and impairments:  Abnormal gait, Decreased activity tolerance, Decreased balance, Decreased coordination, Decreased endurance, Decreased mobility, Decreased knowledge of use of DME, Dizziness, Impaired perceived functional ability, Impaired vision/preception  Visit Diagnosis: Ataxia  Other lack of coordination  Unsteadiness on feet  Other abnormalities of gait and mobility     Problem List Patient Active Problem List   Diagnosis Date Noted  . PFO (patent foramen ovale) 12/03/2017  . Cerebellar ataxia (HCC) 11/14/2017  . Cerebellar infarct (HCC)   . Essential hypertension   . Cerebellar stroke (HCC) 11/12/2017  . Nausea and vomiting 11/12/2017  . Dizziness and giddiness 11/12/2017  . Hyperglycemia 11/12/2017  . Leukocytosis 11/12/2017  . Alcohol use 11/12/2017    Harriet Butte, PT, MPT [facility] [address] [place], [place], [address] Phone: [phone]   Fax:  [phone] 01/01/18, 12:16 PM  Name: BEKIM WERNTZ MRN: [id number] Date of Birth: 1965-07-21

## 2018-01-02 NOTE — Telephone Encounter (Signed)
error 

## 2018-01-05 ENCOUNTER — Ambulatory Visit: Payer: BLUE CROSS/BLUE SHIELD | Admitting: Rehabilitation

## 2018-01-05 ENCOUNTER — Ambulatory Visit: Payer: BLUE CROSS/BLUE SHIELD | Admitting: Occupational Therapy

## 2018-01-05 ENCOUNTER — Encounter: Payer: Self-pay | Admitting: Occupational Therapy

## 2018-01-05 DIAGNOSIS — R41842 Visuospatial deficit: Secondary | ICD-10-CM

## 2018-01-05 DIAGNOSIS — R278 Other lack of coordination: Secondary | ICD-10-CM

## 2018-01-05 DIAGNOSIS — R2681 Unsteadiness on feet: Secondary | ICD-10-CM

## 2018-01-05 DIAGNOSIS — M6281 Muscle weakness (generalized): Secondary | ICD-10-CM | POA: Diagnosis not present

## 2018-01-05 DIAGNOSIS — R2689 Other abnormalities of gait and mobility: Secondary | ICD-10-CM | POA: Diagnosis not present

## 2018-01-05 DIAGNOSIS — R270 Ataxia, unspecified: Secondary | ICD-10-CM

## 2018-01-05 NOTE — Therapy (Signed)
[facility] [address] [place], [place], [address] Phone: [phone]   Fax:  [phone]  Occupational Therapy Treatment  Patient Details  Name: John Mcbride MRN: [id number] Date of Birth: July 03, 1965 No data recorded  Encounter Date: 01/05/2018  OT End of Session - 01/05/18 1514    Visit Number  6    Number of Visits  10    Date for OT Re-Evaluation  01/13/18    Authorization Type  [employer] - pt has 30 visits combined for PT and OT. Pt wishes to use 20 visits for PT and 10 visits for OT    OT Start Time  1318    OT Stop Time  1400    OT Time Calculation (min)  42 min    Activity Tolerance  Patient tolerated treatment well       Past Medical History:  Diagnosis Date  . Heart murmur     Past Surgical History:  Procedure Laterality Date  . PATENT FORAMEN OVALE(PFO) CLOSURE N/A 12/04/2017   Procedure: PATENT FORAMEN OVALE (PFO) CLOSURE;  Surgeon: Sherren Mocha, MD;  Location: [facility];  Service: Cardiovascular;  Laterality: N/A;  . TEE WITHOUT CARDIOVERSION N/A 11/14/2017   Procedure: TRANSESOPHAGEAL ECHOCARDIOGRAM (TEE);  Surgeon: Fay Records, MD;  Location: [facility] ENDOSCOPY;  Service: Cardiovascular;  Laterality: N/A;  bubble study    There were no vitals filed for this visit.  Subjective Assessment - 01/05/18 1321    Subjective   I have to climb ladders and use tools while I am up on the platform    Pertinent History  Pt with cerebellar CVA with ataxia on 11/12/2017.  Discharged home 11/21/2017 after inpt rehab stay.  PMH: HLD, possible alcohol use, Heart murmur    Patient Stated Goals  Get to where I can walk a little better. Staying at my parents right now.    Currently in Pain?  Yes    Pain Score  3     Pain Location  Toe (Comment which one)   fourth toe on LLE   Pain Orientation  Left    Pain Descriptors / Indicators  Sore    Pain Type  Acute pain   pt stubbed toe on floor heater yesterday   Pain Onset  Yesterday    Pain Frequency  Constant    Aggravating Factors   walking    Pain Relieving Factors  rest - its a little better today.                    OT Treatments/Exercises (OP) - 01/05/18 0001      ADLs   Cooking  Addressed high bilateral reach in kitchen lifting glass mixing bowls from high shelf as well as uniliateral low reach without UE support. Pt able to complete both several times wihout LOB.    Work  DIscussed specifics of work requirements as pt wishes to go back to work as soon as possible (pt is not ready to go back at this time). Pt has to use tools, as well as manipulate smalll objects (screws, nails, etc). Practiced using hammer and nails, and using both hands to manipulate screwes of varying sizes with and without washers. Also practiced at low, medium high and overhead reach (work simulation).  Pt with minimal difficulty in clinic for brief periods however coordination is impacted with high or overhead reach as well as with sustained use in these positions.  ADL Comments  Assessed remaining short term goals - see goals for updates.                OT Short Term Goals - 01/05/18 1325      OT SHORT TERM GOAL #1   Title  Pt will be mod I with HEP (LUE coordination, grip strength, balance) - 01/06/2018 (goal date adjusted based on pt's attendance)    Status  Achieved      OT SHORT TERM GOAL #2   Title  Pt will demonstrate improved fine motor coordination for LUE evidenced by decreasing time on 9 hole peg by 10 seconds (baseline = 51.30) to assist with self care tasks    Status  Partially Met   01/01/2018  42.26     OT SHORT TERM GOAL #3   Title  Pt will demonstrate improved grip strength by at least 5 pounds to assist with picking up heavy objects in prep to return to work    Status  Achieved   01/05/2018  95 pounds     OT SHORT TERM GOAL #4   Title  Pt will demonstrate ability to do bilateral overhead tasks in standing with normal BOS  with close supervision (i.e putting away dishes on top shelf of cabinet)    Status  Achieved      OT SHORT TERM GOAL #5   Title  Further assess visual vestibular integration and set goal prn    Status  Achieved      OT SHORT TERM GOAL #6   Title  Pt will be able to pick up light item from low shelf with no more than supervision    Status  Achieved        OT Long Term Goals - 01/05/18 1332      OT LONG TERM GOAL #1   Title  Pt will be mod I with upgraded HEP - 01/13/2018 date adjusted based on pt's attendance  (pt has 30 visits combined for PT and OT and wishes to use 10 visits for OT    Status  On-going      OT LONG TERM GOAL #2   Title  Pt will demonstrate improved coordination LUE as evidenced by decreasing time on 9 hole peg by at least 13 seconds 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     Status  On-going            Plan - 01/05/18 1512    Clinical Impression Statement  Pt has met all STG's and is now working toward LTG's.  PT states he is doing mroe at home and feels less tired.     Occupational Profile and client history currently impacting functional performance  son, friend, employee, father. PMH: HLD, possible alcohol use, heart murmur    Occupational performance deficits (Please refer to evaluation for details):  ADL's;IADL's;Work;Leisure;Social Participation    Rehab Potential  Good    OT Frequency  2x / week    OT Duration  --   5 weeks    OT Treatment/Interventions  Self-care/ADL training;Aquatic Therapy;DME and/or AE instruction;Neuromuscular  education;Therapeutic exercise;Therapist, nutritional;Therapeutic activities;Balance training;Patient/family education    Plan  NMR for balance, functional mobility, LUE control    Consulted and Agree with Plan of Care  Patient       Patient will benefit from skilled therapeutic intervention in order to improve the following deficits and impairments:  Abnormal gait, Decreased balance, Decreased coordination, Decreased knowledge of use of DME, Decreased mobility, Difficulty walking, Decreased strength, Impaired UE functional use  Visit Diagnosis: Ataxia  Other lack of coordination  Unsteadiness on feet  Muscle weakness (generalized)  Visuospatial deficit    Problem List Patient Active Problem List   Diagnosis Date Noted  . PFO (patent foramen ovale) 12/03/2017  . 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 Cerebellar infarct ([place])   . Essential hypertension   . Cerebellar stroke ([place]) 11/12/2017  . Nausea and vomiting 11/12/2017  . Dizziness and giddiness 11/12/2017  . Hyperglycemia 11/12/2017  . Leukocytosis 11/12/2017  . Alcohol use 11/12/2017    Quay Burow, OTR/L 01/05/2018, 3:15 PM  [facility] [address] [place], [place], [address] Phone: [phone]   Fax:  [phone]  Name: John Mcbride MRN: [id number] Date of Birth: Oct 07, 1965

## 2018-01-06 ENCOUNTER — Encounter: Payer: Self-pay | Admitting: Adult Health

## 2018-01-06 ENCOUNTER — Ambulatory Visit (INDEPENDENT_AMBULATORY_CARE_PROVIDER_SITE_OTHER): Payer: BLUE CROSS/BLUE SHIELD | Admitting: Adult Health

## 2018-01-06 VITALS — BP 129/88 | HR 97 | Ht 70.0 in | Wt 226.8 lb

## 2018-01-06 DIAGNOSIS — Q2112 Patent foramen ovale: Secondary | ICD-10-CM

## 2018-01-06 DIAGNOSIS — I10 Essential (primary) hypertension: Secondary | ICD-10-CM | POA: Diagnosis not present

## 2018-01-06 DIAGNOSIS — Q211 Atrial septal defect: Secondary | ICD-10-CM

## 2018-01-06 DIAGNOSIS — I639 Cerebral infarction, unspecified: Secondary | ICD-10-CM

## 2018-01-06 DIAGNOSIS — E785 Hyperlipidemia, unspecified: Secondary | ICD-10-CM | POA: Diagnosis not present

## 2018-01-06 NOTE — Progress Notes (Signed)
[facility] [address] [place]. [place] [address] [phone]       OFFICE FOLLOW UP NOTE  [name] Date of Birth:  09-06-1965 Medical Record Number:  [id number]   Reason for Referral:  hospital stroke follow up  CHIEF COMPLAINT:  Chief Complaint  Patient presents with  . Follow-up    Stroke hospital follow up room in back hallway paient with    HPI: John Mcbride is being seen today for initial visit in the office for left SCA territory infarct related to moderate size PFO on 11/12/2017. History obtained from patient has and chart review. Reviewed all radiology images and labs personally.  Sanuel Mcbride is a 52 year old male with history of alcohol use and not seeing doctor for years who presented with dizziness, difficulty walking, and nausea vomiting.  CT head reviewed and showed left cerebellar infarcts.  MRI brain reviewed and confirmed left SCA territory infarct.  CT head and neck showed left SCA occlusion.  Repeat CT stable, no hydrocephalus.    2D echo showed an EF 60 to 65%.  LDL 85 and A1c 5.0, UDS negative.  LE venous Doppler negative for DVT.  TEE recommended due to concerns of stroke being embolic pattern.  TEE showed moderate PFO. hypercoagulable work-up  negative.  LDL 85 and recommended Lipitor 20 mg daily.  A1c satisfactory at 5.0.  No antithrombotic prior to admission and recommended DAPT for 3 weeks and aspirin alone.  Due to patient's age, and lack of significant stroke risk factors, it was determined that his stroke was most likely related to PFO and recommended for patient to follow with Dr. Excell Seltzer for PFO closure consideration.  Patient was discharged to [facility] for continued therapies.  Patient is being seen today for hospital follow-up.  He did undergo PFO closure on 12/04/2017 in which he tolerated well without complications.  It was recommended for patient to undergo DAPT for 3 months post PFO closure.  Patient continues to take both  aspirin and Plavix without side effects of bleeding or bruising.  He continues to take Lipitor without side effects of myalgias.  He continues to have gait difficulties along with decreased left hand dexterity.  He continues to participate in PT/OT at our neuro rehab clinic next[place]door.  He has not returned to work at this time working in [employer] but is eager to return when he is able.  Blood pressure today satisfactory 129/88.  He does use a cane for ambulation along with a left knee brace is at times his knee will "buckle".  No further concerns at this time and denies new or worsening stroke/TIA symptoms.    ROS:   14 system review of systems performed and negative with exception of weakness  PMH:  Past Medical History:  Diagnosis Date  . Heart murmur   . Stroke [facility])     PSH:  Past Surgical History:  Procedure Laterality Date  . PATENT FORAMEN OVALE(PFO) CLOSURE N/A 12/04/2017   Procedure: PATENT FORAMEN OVALE (PFO) CLOSURE;  Surgeon: [name], MD;  Location: [facility] INVASIVE CV LAB;  Service: Cardiovascular;  Laterality: N/A;  . TEE WITHOUT CARDIOVERSION N/A 11/14/2017   Procedure: TRANSESOPHAGEAL ECHOCARDIOGRAM (TEE);  Surgeon: Pricilla Riffle, MD;  Location: [facility] ENDOSCOPY;  Service: Cardiovascular;  Laterality: N/A;  bubble study    Social History:  Social History   Socioeconomic History  . Marital status: Single    Spouse name: Not on file  . Number of children:  Not on file  . Years of education: Not on file  . Highest education level: Not on file  Occupational History  . 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 Financial resource strain: Not hard at all  . Food insecurity:    Worry: Never true    Inability: Never true  . Transportation needs:    Medical: No    Non-medical: No  Tobacco Use  . Smoking status: Never Smoker  . Smokeless tobacco: Never Used  Substance and Sexual Activity  . Alcohol use: Yes    Alcohol/week: 10.0  standard drinks    Types: 10 Standard drinks or equivalent per week  . Drug use: No  . Sexual activity: Not on file  Lifestyle  . Physical activity:    Days per week: 0 days    Minutes per session: 0 min  . Stress: Not at all  Relationships  . Social connections:    Talks on phone: More than three times a week    Gets together: More than three times a week    Attends religious service: Patient refused    Active member of club or organization: Patient refused    Attends meetings of clubs or organizations: Patient refused    Relationship status: Patient refused  . Intimate partner violence:    Fear of current or ex partner: Patient refused    Emotionally abused: Patient refused    Physically abused: Patient refused    Forced sexual activity: Patient refused  Other Topics Concern  . Not on file  Social History Narrative  . Not on file    Family History:  Family History  Problem Relation Age of Onset  . Cancer Mother   . Cancer Father   . Heart disease Father     Medications:   Current Outpatient Medications on File Prior to Visit  Medication Sig Dispense Refill  . ASPIRIN 81 PO Take by mouth.    Marland Kitchen atorvastatin (LIPITOR) 20 MG tablet Take 1 tablet (20 mg total) by mouth daily at 6 PM. 30 tablet 0  . clopidogrel (PLAVIX) 75 MG tablet Take 1 tablet (75 mg total) by mouth daily. 90 tablet 0   No current facility-administered medications on file prior to visit.     Allergies:  No Known Allergies   Physical Exam  Vitals:   01/06/18 1514  BP: 129/88  Pulse: 97  Weight: 226 lb 12.8 oz (102.9 kg)  Height: 5\' 10"  (1.778 m)   Body mass index is 32.54 kg/m. No exam data present  General: well developed, well nourished, pleasant middle-aged Caucasian male, seated, in no evident distress Head: head normocephalic and atraumatic.   Neck: supple with no carotid or supraclavicular bruits Cardiovascular: regular rate and rhythm, no murmurs Musculoskeletal: no  deformity Skin:  no rash/petichiae Vascular:  Normal pulses all extremities  Neurologic Exam Mental Status: Awake and fully alert. Oriented to place and time. Recent and remote memory intact. Attention span, concentration and fund of knowledge appropriate. Mood and affect appropriate.  Cranial Nerves: Fundoscopic exam reveals sharp disc margins. Pupils equal, briskly reactive to light. Extraocular movements full without nystagmus. Visual fields full to confrontation. Hearing intact. Facial sensation intact. Face, tongue, palate moves normally and symmetrically.  Motor: Normal bulk and tone. Normal strength in all tested extremity muscles. Sensory.: intact to touch , pinprick , position and vibratory sensation.  Coordination: Rapid alternating movements normal in all extremities. Finger-to-nose and heel-to-shin performed accurately bilaterally.  Decreased left hand dexterity. Gait and Station: Arises from chair without difficulty. Stance is normal. Gait demonstrates quick long steps with mild favoring of left leg and assistance of cane.  Unable to tandem walk without difficulty.  Romberg negative. Reflexes: 1+ and symmetric. Toes downgoing.    NIHSS  0 Modified Rankin  2    Diagnostic Data (Labs, Imaging, Testing)  Ct Head Wo Contrast 11/12/2017  Suggestion of asymmetrical low-attenuation and possible mass effect in the left anterior cerebellum and cerebellar peduncle. MRI is recommended for further evaluation. No acute intracranial hemorrhage or mass effect.   Ct Angio Head W Or Wo Contrast Ct Angio Neck W And/or Wo Contrast 11/12/2017 The patient does not show evidence of atherosclerotic vascular disease in general or of dissection. 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   Mr Brain Wo Contrast 11/12/2017 3.5 cm in diameter acute infarction in the superior cerebellum on the left. No evidence of significant swelling/mass effect or of  any hemorrhage. Mild chronic small-vessel ischemic change of the cerebral hemispheric white matter. No large vessel vascular occlusion identified on this noncontrast study.   Ct Head Wo Contrast 11/12/2017  1. Evolving LEFT cerebellar/SCA territory nonhemorrhagic infarct. Regional mass effect without hydrocephalus. 2. Borderline parenchymal brain volume loss for age. 3. Mild chronic small vessel ischemic changes.   2D Echocardiogram  - Left ventricle: The cavity size was normal. Systolic function wasnormal. The estimated ejection fraction was in the range of 60%to 65%. Wall motion was normal; there were no regional wallmotion abnormalities. Left ventricular diastolic functionparameters were normal. - Mitral valve: There was trivial regurgitation. Valve area bypressure half-time: 1.43 cm^2. - Tricuspid valve: There was trivial regurgitation.  LE doppler There is no DVT or SVT noted in the bilateral lower extremities   ASSESSMENT: ZALMAN HULL is a 52 y.o. year old male here with left SCA territory infarct on 11/12/17 secondary to PFO who underwent closure on 12/04/17. Vascular risk factors include PFO with closure, HTN and HLD.  Patient is being seen today for hospital follow-up and does continue to have gait difficulties along with decreased left hand dexterity but both have been improving and hopes to return to work soon.    PLAN: -Continue aspirin 81 mg daily and clopidogrel 75 mg daily  and Lipitor for secondary stroke prevention -Continue DAPT for a total of 3 months post PFO repair along with continued follow-up with cardiology -F/u with PCP regarding your HLD and HTN management -Continue PT/OT at neuro rehab for continued deficits -continue to monitor BP at home -advised to continue to stay active and maintain a healthy diet -Maintain strict control of hypertension with blood pressure goal below 130/90, diabetes with hemoglobin A1c goal below 6.5% and cholesterol with LDL  cholesterol (bad cholesterol) goal below 70 mg/dL. I also advised the patient to eat a healthy diet with plenty of whole grains, cereals, fruits and vegetables, exercise regularly and maintain ideal body weight.  Follow up in 3 months or call earlier if needed   Greater than 50% of time during this 25 minute visit was spent on counseling,explanation of diagnosis of left SCA territory infarct, reviewing risk factor management of PFO, HLD and HTN, planning of further management, discussion with patient and family and coordination of care    George Hugh, [facility]  [facility] [address] [place], [place] [address]  Phone [phone] Fax [phone] Note: This document was prepared with digital dictation and  possible smart Company secretary. Any transcriptional errors that result from this process are unintentional.

## 2018-01-06 NOTE — Progress Notes (Signed)
I agree with the above plan 

## 2018-01-06 NOTE — Progress Notes (Signed)
[facility]   MULTIDISCIPLINARY HEART VALVE CLINIC                                       Cardiology Office Note    Date:  01/08/2018   ID:  John Mcbride, DOB [birth year], MRN [id number]  PCP:  Georgann Housekeeper, MD  Cardiologist:  Dr. Excell Seltzer  CC: 1 month s/p PFO closure   History of Present Illness:  John Mcbride is a 52 y.o. male with a history of cryptogenic CVA and PFO s/p PFO closure who presents to clinic for follow up.   He presented with severe vertigo, nausea, vomiting, and lethargy on 11-12-2017 and was diagnosed with an acute cerebellar stroke. He was treated conservatively and required [facility] inpatient rehabilitation. Stroke work up was unremarkable except for PFO was discovered on TEE.   He underwent successful transcatheter PFO closure with a 25 mm Amplatzer PFO occluder device on 12/04/17. Post op echo showed a well seated device with no interatrial flow. He was discharged on ASA and plavix x 3 months.  Today he presents to clinic for follow up. No CP or SOB. No LE edema, orthopnea or PND. No dizziness or syncope. No blood in stool or urine. No palpitations.  Working with outpatient rehab and doing quite well building up strength. No neuro sx similar to stroke. He works in Administrator, sports and still out of work   Past Medical History:  Diagnosis Date  . Heart murmur   . Stroke [facility])     Past Surgical History:  Procedure Laterality Date  . PATENT FORAMEN OVALE(PFO) CLOSURE N/A 12/04/2017   Procedure: PATENT FORAMEN OVALE (PFO) CLOSURE;  Surgeon: [name], MD;  Location: [facility] INVASIVE CV LAB;  Service: Cardiovascular;  Laterality: N/A;  . TEE WITHOUT CARDIOVERSION N/A 11/14/2017   Procedure: TRANSESOPHAGEAL ECHOCARDIOGRAM (TEE);  Surgeon: Pricilla Riffle, MD;  Location: [facility] [place] [facility] ENDOSCOPY;  Service: Cardiovascular;  Laterality: N/A;  bubble study    Current Medications: Outpatient Medications Prior to Visit  Medication Sig Dispense Refill  . aspirin EC  81 MG tablet Take 81 mg by mouth daily.    Marland Kitchen atorvastatin (LIPITOR) 20 MG tablet Take 1 tablet (20 mg total) by mouth daily at 6 PM. 30 tablet 0  . clopidogrel (PLAVIX) 75 MG tablet Take 1 tablet (75 mg total) by mouth daily. 90 tablet 0  . ASPIRIN 81 PO Take by mouth.     No facility-administered medications prior to visit.      Allergies:   Patient has no known allergies.   Social History   Socioeconomic History  . Marital status: Single    Spouse name: Not on file  . Number of children: Not on file  . Years of education: Not on file  . Highest education level: Not on file  Occupational History  . 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 Financial resource strain: Not hard at all  . Food insecurity:    Worry: Never true    Inability: Never true  . Transportation needs:    Medical: No    Non-medical: No  Tobacco Use  . Smoking status: Never Smoker  . Smokeless tobacco: Never Used  Substance and Sexual Activity  . Alcohol use: Yes    Alcohol/week: 10.0 standard drinks    Types: 10 Standard drinks or equivalent per week  .  Drug use: No  . Sexual activity: Not on file  Lifestyle  . Physical activity:    Days per week: 0 days    Minutes per session: 0 min  . Stress: Not at all  Relationships  . Social connections:    Talks on phone: More than three times a week    Gets together: More than three times a week    Attends religious service: Patient refused    Active member of club or organization: Patient refused    Attends meetings of clubs or organizations: Patient refused    Relationship status: Patient refused  Other Topics Concern  . Not on file  Social History Narrative  . Not on file     Family History:  The patient's family history includes Cancer in his father and mother; Heart disease in his father.      ROS:   Please see the history of present illness.    ROS All other systems reviewed and are negative.   PHYSICAL EXAM:     VS:  BP 118/84   Pulse 89   Ht 5\' 10"  (1.778 m)   Wt 226 lb 12.8 oz (102.9 kg)   SpO2 96%   BMI 32.54 kg/m    GEN: Well nourished, well developed, in no acute distress HEENT: normal Neck: no JVD or masses Cardiac: RRR; no murmurs, rubs, or gallops,no edema  Respiratory:  clear to auscultation bilaterally, normal work of breathing GI: soft, nontender, nondistended, + BS MS: no deformity or atrophy Skin: warm and dry, no rash Neuro:  Alert and Oriented x 3, Strength and sensation are intact Psych: euthymic mood, full affect   Wt Readings from Last 3 Encounters:  01/08/18 226 lb 12.8 oz (102.9 kg)  01/06/18 226 lb 12.8 oz (102.9 kg)  12/11/17 224 lb (101.6 kg)      Studies/Labs Reviewed:   EKG:  EKG is NOT ordered today. Recent Labs: 11/13/2017: Magnesium 2.3 11/17/2017: ALT 40; BUN 11; Hemoglobin 16.4; Platelets 256; Potassium 4.2; Sodium 141 11/21/2017: Creatinine, Ser 1.04   Lipid Panel    Component Value Date/Time   CHOL 139 11/13/2017 0415   TRIG 101 11/13/2017 0415   HDL 34 (L) 11/13/2017 0415   CHOLHDL 4.1 11/13/2017 0415   VLDL 20 11/13/2017 0415   LDLCALC 85 11/13/2017 0415    Additional studies/ records that were reviewed today include:  12/04/17 PATENT FORAMEN OVALE (PFO) CLOSURE  Conclusion Successful transcatheter PFO closure under fluoroscopic and intracardiac echo guidance - 25 mm Amplatzer PFO occluder  Recommend uninterrupted dual antiplatelet therapy with Aspirin 81mg  daily and Clopidogrel 75mg  daily for a minimum of 3 months.   _________________  Echo 12/04/17 Study Conclusions - Left ventricle: The cavity size was normal. Systolic function was   normal. The estimated ejection fraction was in the range of 55%   to 60%. Wall motion was normal; there were no regional wall   motion abnormalities. Doppler parameters are consistent with   abnormal left ventricular relaxation (grade 1 diastolic   dysfunction). There was no evidence of elevated  ventricular   filling pressure by Doppler parameters. - Aortic valve: There was no regurgitation. - Mitral valve: There was trivial regurgitation. - Right ventricle: Systolic function was normal. - Tricuspid valve: There was no regurgitation. - Pulmonary arteries: Systolic pressure could not be accurately   estimated. - Inferior vena cava: The vessel was normal in size. - Pericardium, extracardiac: There was no pericardial effusion. Impressions: - An Amplatzer occluder device  is seen in the interatrial septum   with no residual flow.    ASSESSMENT & PLAN:   PFO s/p PFO closure: doing excellent. No new symptoms. Working with outpatient rehab. Continue on ASA and plavix. He can stop plavix after 3 months of therapy on 03/05/18. He understands the need for SBE prophylaxis x 6 months and has amoxicillin. He is a part of the research protocol. I will see him back in 6 months and again in 1 year with an echo with bubble study  Cryptogenic CVA: s/p PFO closure as above. Continue ASA and statin   Medication Adjustments/Labs and Tests Ordered: Current medicines are reviewed at length with the patient today.  Concerns regarding medicines are outlined above.  Medication changes, Labs and Tests ordered today are listed in the Patient Instructions below. Patient Instructions  Medication Instructions:  You can STOP ASPIRIN March 05, 2018.  Labwork: None  Testing/Procedures: None  Follow-Up:   Any Other Special Instructions Will Be Listed Below (If Applicable).     If you need a refill on your cardiac medications before your next appointment, please call your pharmacy.      Signed, Cline Crock, PA-C  01/08/2018 1:59 PM    [facility] [address] [place], [place], [place]  [address] Phone: [phone]; Fax: [phone]

## 2018-01-06 NOTE — Patient Instructions (Signed)
Continue aspirin 81 mg daily and clopidogrel 75 mg daily  and lipitor  for secondary stroke prevention  Continue aspirin and plavix for a total of 3 months after PFO repair along with continued follow up with cardiology  Continue to follow up with PCP regarding cholesterol and blood pressure management   Continue physical and occupational therapy for continued hand weakness and dizziness  Continue to monitor blood pressure at home  Maintain strict control of hypertension with blood pressure goal below 130/90, diabetes with hemoglobin A1c goal below 6.5% and cholesterol with LDL cholesterol (bad cholesterol) goal below 70 mg/dL. I also advised the patient to eat a healthy diet with plenty of whole grains, cereals, fruits and vegetables, exercise regularly and maintain ideal body weight.  Followup in the future with me in 3 months or call earlier if needed       Thank you for coming to see [place] at [facility]. I hope we have been able to provide you high quality care today.  You may receive a patient satisfaction survey over the next few weeks. We would appreciate your feedback and comments so that we may continue to improve ourselves and the health of our patients.

## 2018-01-07 ENCOUNTER — Ambulatory Visit: Payer: BLUE CROSS/BLUE SHIELD | Admitting: Occupational Therapy

## 2018-01-07 ENCOUNTER — Ambulatory Visit: Payer: BLUE CROSS/BLUE SHIELD | Admitting: Physical Therapy

## 2018-01-07 ENCOUNTER — Encounter: Payer: Self-pay | Admitting: Physical Therapy

## 2018-01-07 ENCOUNTER — Encounter: Payer: Self-pay | Admitting: Occupational Therapy

## 2018-01-07 DIAGNOSIS — R270 Ataxia, unspecified: Secondary | ICD-10-CM | POA: Diagnosis not present

## 2018-01-07 DIAGNOSIS — R278 Other lack of coordination: Secondary | ICD-10-CM

## 2018-01-07 DIAGNOSIS — R2681 Unsteadiness on feet: Secondary | ICD-10-CM | POA: Diagnosis not present

## 2018-01-07 DIAGNOSIS — M6281 Muscle weakness (generalized): Secondary | ICD-10-CM

## 2018-01-07 DIAGNOSIS — R2689 Other abnormalities of gait and mobility: Secondary | ICD-10-CM | POA: Diagnosis not present

## 2018-01-07 DIAGNOSIS — R41842 Visuospatial deficit: Secondary | ICD-10-CM

## 2018-01-07 NOTE — Therapy (Signed)
[facility] [address] [place], [place], [address] Phone: [phone]   Fax:  [phone]  Physical Therapy Treatment  Patient Details  Name: John Mcbride MRN: [id number] Date of Birth: [birth year] Referring Provider (PT): Dr. Claudette Laws   Encounter Date: 01/07/2018  PT End of Session - 01/07/18 1625    Visit Number  5    Number of Visits  17    Date for PT Re-Evaluation  01/27/18    Authorization Type  BCBS     Authorization - Visit Number  5    Authorization - Number of Visits  20    PT Start Time  1530    PT Stop Time  1615    PT Time Calculation (min)  45 min       Past Medical History:  Diagnosis Date  . Heart murmur   . Stroke [facility])     Past Surgical History:  Procedure Laterality Date  . PATENT FORAMEN OVALE(PFO) CLOSURE N/A 12/04/2017   Procedure: PATENT FORAMEN OVALE (PFO) CLOSURE;  Surgeon: [name], MD;  Location: [facility] INVASIVE CV LAB;  Service: Cardiovascular;  Laterality: N/A;  . TEE WITHOUT CARDIOVERSION N/A 11/14/2017   Procedure: TRANSESOPHAGEAL ECHOCARDIOGRAM (TEE);  Surgeon: Pricilla Riffle, MD;  Location: [facility] ENDOSCOPY;  Service: Cardiovascular;  Laterality: N/A;  bubble study    There were no vitals filed for this visit.  Subjective Assessment - 01/07/18 1537    Subjective  Pt stated he's been trying to work on left knee control with walking; left his brace in the car; seems to help some; no falls     Pertinent History  HTN, alcohol use    Limitations  House hold activities;Walking    Patient Stated Goals  "Be more balanced and walk without the walker."     Currently in Pain?  No/denies                       [facility] Adult PT Treatment/Exercise - 01/07/18 0001      Ambulation/Gait   Ambulation/Gait  Yes    Ambulation/Gait Assistance  6: Modified independent (Device/Increase time)    Ambulation/Gait Assistance Details  treadmill several bouts; 1.4-1.70mph used  3# and 9 # ankle weights  also on level ground with cane several bouts in gym assessing gait pattern; worked on getting righ foot to face forward and narrow his base of support and slow his gait pattern for better left knee control during stance     Gait Pattern  Step-through pattern             PT Education - 01/07/18 1636    Education Details  educated on LLD and initial inexpensive way to correct to see how he does with a small lift on the right ; educated on working on quality of mvt vs. quatity; he does not have to walk so fast     Person(s) Educated  Patient    Methods  Explanation;Demonstration    Comprehension  Verbalized understanding       PT Short Term Goals - 12/18/17 1523      PT SHORT TERM GOAL #1   Title  Pt will initiate HEP in order to indicate improved balance and coordination. (Target Date: 01/17/18-updated to reflect 3 week delay in start)    Time  4    Period  Weeks    Status  New    Target Date  01/17/18  PT SHORT TERM GOAL #2   Title  Pt will improve BERG balance test to 51/56 in order to indicate decreased fall risk.     Status  New      PT SHORT TERM GOAL #3   Title  Pt will perform TUG <13.5 secs without AD in order to indicate decreased fall risk.     Status  New      PT SHORT TERM GOAL #4   Title  Pt will ambulate over unlevel outdoor surfaces (paved and grass) with SPC at mod I level in order to indicate improved functional and community independence.     Status  New      PT SHORT TERM GOAL #5   Title  Pt will improve gait speed to >/=2.62 ft/sec with LRAD in order to indicate improved community ambulation.     Status  New        PT Long Term Goals - 12/18/17 1524      PT LONG TERM GOAL #1   Title  Pt will be independent with final HEP in order to indicate improved balance and coordination.  (Target Date: 02/16/18-updated to relfect 3 week delay in start)    Time  8    Period  Weeks    Status  New    Target Date  02/16/18       PT LONG TERM GOAL #2   Title  Pt will perform FGA as appropriate and improve score by 4 points.      Status  New      PT LONG TERM GOAL #3   Title  Pt will improve gait speed to 3.12 ft/sec without AD in order to indicate decreased fall risk and improved efficiency of gait.     Status  New      PT LONG TERM GOAL #4   Title  Pt will ambulate >1000' over varying outdoor surfaces without AD at independent level in order to indicate safe return to leisure and community activity.      Status  New      PT LONG TERM GOAL #5   Title  Pt will be able to carry 20# for 20' demonstrating safe body mechanics when lifting and placing to ground in order to indicate safe return to work.     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      Status  New            Plan - 01/07/18 1626    Clinical Impression Statement  Pt has LLD of approx  1/2 shorter on the right; he had fx of both tib and fib when younger and it was never surgically corrected; notice the discrepency during gait assessment; after some differnent trials we put a lift in the right shoe and it smoothed out the left knee hyperextension and made his hip level; he did state he has intermittent lower back pain;  he felt more stable with the lift in the shoe as well. ;On treadmill adding the ankle wieghts;  1 st bout 3 pounds then 9 pounds he was able to sense and have more control of the left knee/LE;  he is moving with too much momentum and ther ex needs to have him moving in  slow highly graded muscle recruitment pattners and eliminate the momentum to improve his control    PT Treatment/Interventions  ADLs/Self Care Home Management;Aquatic Therapy;Electrical Stimulation;DME Instruction;Gait training;Stair training;Functional mobility training;Therapeutic activities;Therapeutic  exercise;Balance training;Neuromuscular re-education;Patient/family education;Vestibular    PT Next Visit Plan  continue with closed chain L motor control (quadruped/tall kneeling-esp transitions to L side sit and back without UE support).   also see if he put an insert in his right shoe and see how it is affecting his walking;     Recommended Other Services  may need to see orthotist to get shoe adjusted for LLD    Consulted and Agree with Plan of Care  Patient       Patient will benefit from skilled therapeutic intervention in order to improve the following deficits and impairments:  Abnormal gait, Decreased activity tolerance, Decreased balance, Decreased coordination, Decreased endurance, Decreased mobility, Decreased knowledge of use of DME, Dizziness, Impaired perceived functional ability, Impaired vision/preception  Visit Diagnosis: Ataxia  Other lack of coordination  Unsteadiness on feet  Muscle weakness (generalized)     Problem List Patient Active Problem List   Diagnosis Date Noted  . PFO (patent foramen ovale) 12/03/2017  . Cerebellar ataxia (HCC) 11/14/2017  . Cerebellar infarct (HCC)   . Essential hypertension   . Cerebellar stroke (HCC) 11/12/2017  . Nausea and vomiting 11/12/2017  . Dizziness and giddiness 11/12/2017  . Hyperglycemia 11/12/2017  . Leukocytosis 11/12/2017  . Alcohol use 11/12/2017    Vashti Hey D PT DPT  01/07/2018, 4:39 PM  [facility] [address] [place], [place], [address] Phone: [phone]   Fax:  [phone]  Name: John Mcbride MRN: [id number] Date of Birth: 1965/07/10

## 2018-01-07 NOTE — Therapy (Signed)
[facility] [address] [place], [place], [address] Phone: [phone]   Fax:  [phone]  Occupational Therapy Treatment  Patient Details  Name: John Mcbride MRN: [id number] Date of Birth: 1965-07-20 No data recorded  Encounter Date: 01/07/2018  OT End of Session - 01/07/18 1707    OT Start Time  1447    OT Stop Time  1530    OT Time Calculation (min)  43 min       Past Medical History:  Diagnosis Date  . Heart murmur   . Stroke [facility])     Past Surgical History:  Procedure Laterality Date  . PATENT FORAMEN OVALE(PFO) CLOSURE N/A 12/04/2017   Procedure: PATENT FORAMEN OVALE (PFO) CLOSURE;  Surgeon: Sherren Mocha, MD;  Location: [place] CV LAB;  Service: Cardiovascular;  Laterality: N/A;  . TEE WITHOUT CARDIOVERSION N/A 11/14/2017   Procedure: TRANSESOPHAGEAL ECHOCARDIOGRAM (TEE);  Surgeon: Fay Records, MD;  Location: [facility] ENDOSCOPY;  Service: Cardiovascular;  Laterality: N/A;  bubble study    There were no vitals filed for this visit.  Subjective Assessment - 01/07/18 1450    Subjective   i jammed my toe pretty good the other day    Pertinent History  Pt with cerebellar CVA with ataxia on 11/12/2017.  Discharged home 11/21/2017 after inpt rehab stay.  PMH: HLD, possible alcohol use, Heart murmur    Patient Stated Goals  Get to where I can walk a little better. Staying at my parents right now.    Currently in Pain?  No/denies    Pain Score  0-No pain                   OT Treatments/Exercises (OP) - 01/07/18 0001      Neurological Re-education Exercises   Other Exercises 1  Neuromuscular reeducation to address dynamic postural control.  Patient has good proximal strength, but lacks ability to modulate grade muscle activity.  Worked on graded control for transition 4 point to side sit, 4 point to 3 point, to plank.  Worked on being able to control speed and control with coordianted activities,  walking and tossing catching small balls.  Added rotating balls in left hand to encourage more balance between radial and ulnar aspects of hand.               OT Education - 01/07/18 1703    Education Details  encouraged tool use with friend to challenge bilateral hand use in various positions    Person(s) Educated  Patient    Methods  Explanation;Demonstration;Handout    Comprehension  Verbalized understanding;Returned demonstration       OT Short Term Goals - 01/05/18 1325      OT SHORT TERM GOAL #1   Title  Pt will be mod I with HEP (LUE coordination, grip strength, balance) - 01/06/2018 (goal date adjusted based on pt's attendance)    Status  Achieved      OT SHORT TERM GOAL #2   Title  Pt will demonstrate improved fine motor coordination for LUE evidenced by decreasing time on 9 hole peg by 10 seconds (baseline = 51.30) to assist with self care tasks    Status  Partially Met   01/01/2018  42.26     OT SHORT TERM GOAL #3   Title  Pt will demonstrate improved grip strength by at least 5 pounds to assist with picking up heavy objects in prep to return to work  Status  Achieved   01/05/2018  95 pounds     OT SHORT TERM GOAL #4   Title  Pt will demonstrate ability to do bilateral overhead tasks in standing with normal BOS with close supervision (i.e putting away dishes on top shelf of cabinet)    Status  Achieved      OT SHORT TERM GOAL #5   Title  Further assess visual vestibular integration and set goal prn    Status  Achieved      OT SHORT TERM GOAL #6   Title  Pt will be able to pick up light item from low shelf with no more than supervision    Status  Achieved        OT Long Term Goals - 01/07/18 1451      OT LONG TERM GOAL #3   Title  Pt will demonstrate improved gross motor control as evidenced by improving score on Box and Blocks by at least 7 blocks to assist with home mgmt tasks (baseline= 28)    Status  Achieved   42           Plan - 01/07/18  1704    Clinical Impression Statement  Patient is driving, and managing some IADL in his own home.  Patient eager to return to work.    Occupational Profile and client history currently impacting functional performance  son, friend, employee, father. PMH: HLD, possible alcohol use, heart murmur    Occupational performance deficits (Please refer to evaluation for details):  ADL's;IADL's;Work;Leisure;Social Participation    Rehab Potential  Good    OT Frequency  2x / week    OT Treatment/Interventions  Self-care/ADL training;Aquatic Therapy;DME and/or AE instruction;Neuromuscular education;Therapeutic exercise;Therapist, nutritional;Therapeutic activities;Balance training;Patient/family education    Plan  NMR for balance, functional mobility, LUE control, check if he tried any tool use - change out tires on side by side    Clinical Decision Making  Several treatment options, min-mod task modification necessary    Consulted and Agree with Plan of Care  Patient       Patient will benefit from skilled therapeutic intervention in order to improve the following deficits and impairments:  Abnormal gait, Decreased balance, Decreased coordination, Decreased knowledge of use of DME, Decreased mobility, Difficulty walking, Decreased strength, Impaired UE functional use  Visit Diagnosis: Other lack of coordination  Ataxia  Unsteadiness on feet  Muscle weakness (generalized)  Visuospatial deficit    Problem List Patient Active Problem List   Diagnosis Date Noted  . PFO (patent foramen ovale) 12/03/2017  . Cerebellar ataxia ([place]) 11/14/2017  . Cerebellar infarct ([place])   . Essential hypertension   . Cerebellar stroke ([place]) 11/12/2017  . Nausea and vomiting 11/12/2017  . Dizziness and giddiness 11/12/2017  . Hyperglycemia 11/12/2017  . Leukocytosis 11/12/2017  . Alcohol use 11/12/2017    Mariah Milling, OTR/L 01/07/2018, 5:07 PM  [facility] [address] [place], [place], [address] Phone: [phone]   Fax:  [phone]  Name: John Mcbride MRN: [id number] Date of Birth: 1966/01/04

## 2018-01-08 ENCOUNTER — Encounter: Payer: Self-pay | Admitting: Physician Assistant

## 2018-01-08 ENCOUNTER — Ambulatory Visit (INDEPENDENT_AMBULATORY_CARE_PROVIDER_SITE_OTHER): Payer: BLUE CROSS/BLUE SHIELD | Admitting: Physician Assistant

## 2018-01-08 VITALS — BP 118/84 | HR 89 | Ht 70.0 in | Wt 226.8 lb

## 2018-01-08 DIAGNOSIS — Z8774 Personal history of (corrected) congenital malformations of heart and circulatory system: Secondary | ICD-10-CM

## 2018-01-08 NOTE — Patient Instructions (Addendum)
Medication Instructions:  You can STOP ASPIRIN March 05, 2018.  Labwork: None  Testing/Procedures: None  Follow-Up: You have a follow-up appointment scheduled June 17, 2018 at 1:30PM.   Any Other Special Instructions Will Be Listed Below (If Applicable).     If you need a refill on your cardiac medications before your next appointment, please call your pharmacy.

## 2018-01-13 ENCOUNTER — Encounter: Payer: Self-pay | Admitting: Occupational Therapy

## 2018-01-13 ENCOUNTER — Ambulatory Visit: Payer: BLUE CROSS/BLUE SHIELD | Admitting: Physical Therapy

## 2018-01-13 ENCOUNTER — Encounter: Payer: Self-pay | Admitting: Physical Therapy

## 2018-01-13 ENCOUNTER — Ambulatory Visit: Payer: BLUE CROSS/BLUE SHIELD | Admitting: Occupational Therapy

## 2018-01-13 DIAGNOSIS — R41842 Visuospatial deficit: Secondary | ICD-10-CM

## 2018-01-13 DIAGNOSIS — R2681 Unsteadiness on feet: Secondary | ICD-10-CM

## 2018-01-13 DIAGNOSIS — M6281 Muscle weakness (generalized): Secondary | ICD-10-CM | POA: Diagnosis not present

## 2018-01-13 DIAGNOSIS — R2689 Other abnormalities of gait and mobility: Secondary | ICD-10-CM

## 2018-01-13 DIAGNOSIS — R278 Other lack of coordination: Secondary | ICD-10-CM

## 2018-01-13 DIAGNOSIS — R270 Ataxia, unspecified: Secondary | ICD-10-CM

## 2018-01-13 NOTE — Therapy (Signed)
[facility] [address] [place], [place], [address] Phone: [phone]   Fax:  [phone]  Physical Therapy Treatment  Patient Details  Name: John Mcbride MRN: [id number] Date of Birth: 26-Jul-1965 Referring Provider (PT): Dr. Claudette Laws   Encounter Date: 01/13/2018  PT End of Session - 01/13/18 1421    Visit Number  6    Number of Visits  17    Date for PT Re-Evaluation  01/27/18    Authorization Type  BCBS     Authorization - Visit Number  6    Authorization - Number of Visits  20    PT Start Time  1400    PT Stop Time  1443    PT Time Calculation (min)  43 min    Activity Tolerance  Patient tolerated treatment well    Behavior During Therapy  [facility] for tasks assessed/performed       Past Medical History:  Diagnosis Date  . Heart murmur   . Stroke [facility])     Past Surgical History:  Procedure Laterality Date  . PATENT FORAMEN OVALE(PFO) CLOSURE N/A 12/04/2017   Procedure: PATENT FORAMEN OVALE (PFO) CLOSURE;  Surgeon: [name], MD;  Location: [facility] INVASIVE CV LAB;  Service: Cardiovascular;  Laterality: N/A;  . TEE WITHOUT CARDIOVERSION N/A 11/14/2017   Procedure: TRANSESOPHAGEAL ECHOCARDIOGRAM (TEE);  Surgeon: Pricilla Riffle, MD;  Location: [facility] ENDOSCOPY;  Service: Cardiovascular;  Laterality: N/A;  bubble study    There were no vitals filed for this visit.  Subjective Assessment - 01/13/18 1418    Subjective  doing well - no new issues. Feels like his knee is getting better but fatigues easily    Pertinent History  HTN, alcohol use    Patient Stated Goals  "Be more balanced and walk without the walker."     Currently in Pain?  No/denies    Pain Score  0-No pain                       [facility] Adult PT Treatment/Exercise - 01/13/18 1434      Neuro Re-ed    Neuro Re-ed Details   in quadruped: side sitting with cone placement up on k-bench with side sit to quadruped x 12 each side -  most difficulty with lowering/rising from L side; alternating hip extension from quadruped with noted difficulty weight shift onto L UE and tendency to hip drop on L side x 10, fir hyrants 2 x 10 (1 set with green tband at knees) with cueing for equal weight shifting and stable pelvic positioning, modified plank with weighted pull throughs (15#) targetting alternating UE weight shift and stable core activation 2 x 12; seated on green disc on mat table with 4" block under feet for hip/knee positioning alternating hip marching x 10, alternating hip march + alternating UE flexion (3#) x 10, alternating LAQ. in tall kneeling - resisted hip extension with green tband at hips x 15; tandem stance with 3.3# ball front pushouts and overhead press x 10 B                PT Short Term Goals - 12/18/17 1523      PT SHORT TERM GOAL #1   Title  Pt will initiate HEP in order to indicate improved balance and coordination. (Target Date: 01/17/18-updated to reflect 3 week delay in start)    Time  4    Period  Weeks  Status  New    Target Date  01/17/18      PT SHORT TERM GOAL #2   Title  Pt will improve BERG balance test to 51/56 in order to indicate decreased fall risk.     Status  New      PT SHORT TERM GOAL #3   Title  Pt will perform TUG <13.5 secs without AD in order to indicate decreased fall risk.     Status  New      PT SHORT TERM GOAL #4   Title  Pt will ambulate over unlevel outdoor surfaces (paved and grass) with SPC at mod I level in order to indicate improved functional and community independence.     Status  New      PT SHORT TERM GOAL #5   Title  Pt will improve gait speed to >/=2.62 ft/sec with LRAD in order to indicate improved community ambulation.     Status  New        PT Long Term Goals - 12/18/17 1524      PT LONG TERM GOAL #1   Title  Pt will be independent with final HEP in order to indicate improved balance and coordination.  (Target Date: 02/16/18-updated to  relfect 3 week delay in start)    Time  8    Period  Weeks    Status  New    Target Date  02/16/18      PT LONG TERM GOAL #2   Title  Pt will perform FGA as appropriate and improve score by 4 points.      Status  New      PT LONG TERM GOAL #3   Title  Pt will improve gait speed to 3.12 ft/sec without AD in order to indicate decreased fall risk and improved efficiency of gait.     Status  New      PT LONG TERM GOAL #4   Title  Pt will ambulate >1000' over varying outdoor surfaces without AD at independent level in order to indicate safe return to leisure and community activity.      Status  New      PT LONG TERM GOAL #5   Title  Pt will be able to carry 20# for 20' demonstrating safe body mechanics when lifting and placing to ground in order to indicate safe return to work.     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      Status  New            Plan - 01/13/18 1422    Clinical Impression Statement  Session today focusing on NMR and core strengthening needed for functional tasks. Patient with most difficulty in quadruped with weight shifting onto L UE/LE with tendency to hip drop/rotate pelvis. Patient requiring verbal cueing throughout session for appropriate core activation and to maintain. Discussion on progression to work related activities at next session inclusing climbing ladder.     PT Treatment/Interventions  ADLs/Self Care Home Management;Aquatic Therapy;Electrical Stimulation;DME Instruction;Gait training;Stair training;Functional mobility training;Therapeutic activities;Therapeutic exercise;Balance training;Neuromuscular re-education;Patient/family education;Vestibular    PT Next Visit Plan  continue with closed chain L motor control (quadruped/tall kneeling-esp transitions to L side sit and back without UE  support).   also  see if he put an insert in his right shoe and see how it is affecting his walking; ladder work for work simulation    PT Home Exercise Plan  Will have to recert at end of POC to reflect 3 week delay in start    Consulted and Agree with Plan of Care  Patient       Patient will benefit from skilled therapeutic intervention in order to improve the following deficits and impairments:  Abnormal gait, Decreased activity tolerance, Decreased balance, Decreased coordination, Decreased endurance, Decreased mobility, Decreased knowledge of use of DME, Dizziness, Impaired perceived functional ability, Impaired vision/preception  Visit Diagnosis: Ataxia  Other lack of coordination  Unsteadiness on feet  Other abnormalities of gait and mobility     Problem List Patient Active Problem List   Diagnosis Date Noted  . PFO (patent foramen ovale) 12/03/2017  . Cerebellar ataxia (HCC) 11/14/2017  . Cerebellar infarct (HCC)   . Essential hypertension   . Cerebellar stroke (HCC) 11/12/2017  . Nausea and vomiting 11/12/2017  . Dizziness and giddiness 11/12/2017  . Hyperglycemia 11/12/2017  . Leukocytosis 11/12/2017  . Alcohol use 11/12/2017    Kipp Laurence, PT, DPT Supplemental Physical Therapist 01/13/18 3:24 PM Pager: [phone] Office: [phone]  [facility] Out[facility] [address] [place], [place], [address] Phone: [phone]   Fax:  [phone]  Name: John Mcbride MRN: [id number] Date of Birth: Aug 04, 1965

## 2018-01-13 NOTE — Therapy (Signed)
[facility] [place] [address] [place], [place], [address] Phone: [phone]   Fax:  [phone]  Occupational Therapy Treatment  Patient Details  Name: John Mcbride MRN: [id number] Date of Birth: 18-Jan-1966 No data recorded  Encounter Date: 01/13/2018  OT End of Session - 01/13/18 1415    Visit Number  8    Number of Visits  10    Date for OT Re-Evaluation  01/13/18    Authorization Type  BCBS - pt has 30 visits combined for PT and OT. Pt wishes to use 20 visits for PT and 10 visits for OT    OT Start Time  1316    OT Stop Time  1359    OT Time Calculation (min)  43 min       Past Medical History:  Diagnosis Date  . Heart murmur   . Stroke [facility])     Past Surgical History:  Procedure Laterality Date  . PATENT FORAMEN OVALE(PFO) CLOSURE N/A 12/04/2017   Procedure: PATENT FORAMEN OVALE (PFO) CLOSURE;  Surgeon: Sherren Mocha, MD;  Location: [facility] CV LAB;  Service: Cardiovascular;  Laterality: N/A;  . TEE WITHOUT CARDIOVERSION N/A 11/14/2017   Procedure: TRANSESOPHAGEAL ECHOCARDIOGRAM (TEE);  Surgeon: Fay Records, MD;  Location: [facility] ENDOSCOPY;  Service: Cardiovascular;  Laterality: N/A;  bubble study    There were no vitals filed for this visit.  Subjective Assessment - 01/13/18 1319    Subjective   My hand is so much better than it was    Pertinent History  Pt with cerebellar CVA with ataxia on 11/12/2017.  Discharged home 11/21/2017 after inpt rehab stay.  PMH: HLD, possible alcohol use, Heart murmur    Patient Stated Goals  Get to where I can walk a little better. Staying at my parents right now.    Currently in Pain?  No/denies                   OT Treatments/Exercises (OP) - 01/13/18 0001      ADLs   ADL Comments  Assessed 9 hole peg - see goal section for updates.  Pt also reports he used tools this weekend to work on his camper and it went well.       Hand Exercises   Other Hand  Exercises  Therapeutic activities to address fine motor and in hand coordination with small items in order to simulate work related activities.  Addressed ability to manipulate first 2 small items and then 3 small items in L hand.  Initially pt with moderate difficulty howeever with repettion and practice pt's performance improved signficantly.  Also addressed upgrading HEP to include typing activities that require increased use of L hand as pt has to do some minimal typing at work.  Will further assess pt's fine motor manipulation with overhead reach in standing next session.                OT Short Term Goals - 01/13/18 1412      OT SHORT TERM GOAL #1   Title  Pt will be mod I with HEP (LUE coordination, grip strength, balance) - 01/06/2018 (goal date adjusted based on pt's attendance)    Status  Achieved      OT SHORT TERM GOAL #2   Title  Pt will demonstrate improved fine motor coordination for LUE evidenced by decreasing time on 9 hole peg by 10 seconds (baseline = 51.30) to assist with self care tasks  Status  Partially Met   01/01/2018  42.26     OT SHORT TERM GOAL #3   Title  Pt will demonstrate improved grip strength by at least 5 pounds to assist with picking up heavy objects in prep to return to work    Status  Achieved   01/05/2018  95 pounds     OT SHORT TERM GOAL #4   Title  Pt will demonstrate ability to do bilateral overhead tasks in standing with normal BOS with close supervision (i.e putting away dishes on top shelf of cabinet)    Status  Achieved      OT SHORT TERM GOAL #5   Title  Further assess visual vestibular integration and set goal prn    Status  Achieved      OT SHORT TERM GOAL #6   Title  Pt will be able to pick up light item from low shelf with no more than supervision    Status  Achieved        OT Long Term Goals - 01/13/18 1320      OT LONG TERM GOAL #1   Title  Pt will be mod I with upgraded HEP - 01/13/2018 date adjusted based on pt's  attendance  (pt has 30 visits combined for PT and OT and wishes to use 10 visits for OT    Status  On-going      OT LONG TERM GOAL #2   Title  Pt will demonstrate improved coordination LUE as evidenced by decreasing time on 9 hole peg by at least 13 seconds to 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     Status  Achieved            Plan - 01/13/18 1413    Clinical Impression Statement  Pt making excellent progress toward goals. Next session will focus on further assessment for overhead use of both hands for fine motor in standing (further work simulation) as well as Conservation officer, nature.      Occupational Profile and client history currently impacting functional performance  son, friend, employee, father. PMH: HLD, possible alcohol use, heart murmur    Occupational performance deficits (Please refer to evaluation for details):  ADL's;IADL's;Work;Leisure;Social Participation    Rehab Potential  Good    OT Frequency  2x / week    OT Duration  Other (comment)   5 weeks   OT Treatment/Interventions  Self-care/ADL training;Aquatic Therapy;DME and/or AE instruction;Neuromuscular education;Therapeutic exercise;Therapist, nutritional;Therapeutic activities;Balance training;Patient/family education    Plan  overhead fine motor using both hands in standing for work simulation. D/c from OT after next session    Consulted and Agree with Plan of Care  Patient       Patient will benefit from skilled therapeutic intervention in order to improve the following deficits and  impairments:  Abnormal gait, Decreased balance, Decreased coordination, Decreased knowledge of use of DME, Decreased mobility, Difficulty walking,  Decreased strength, Impaired UE functional use  Visit Diagnosis: Ataxia  Other lack of coordination  Unsteadiness on feet  Muscle weakness (generalized)  Visuospatial deficit    Problem List Patient Active Problem List   Diagnosis Date Noted  . PFO (patent foramen ovale) 12/03/2017  . Cerebellar ataxia ([place]) 11/14/2017  . Cerebellar infarct ([place])   . Essential hypertension   . Cerebellar stroke ([place]) 11/12/2017  . Nausea and vomiting 11/12/2017  . Dizziness and giddiness 11/12/2017  . Hyperglycemia 11/12/2017  . Leukocytosis 11/12/2017  . Alcohol use 11/12/2017    Quay Burow, OTR/L 01/13/2018, 2:17 PM  [facility] [address] [place], [place], [address] Phone: [phone]   Fax:  [phone]  Name: NEVILLE WALSTON MRN: [id number] Date of Birth: Nov 12, 1965

## 2018-01-15 ENCOUNTER — Encounter: Payer: Self-pay | Admitting: Physical Therapy

## 2018-01-15 ENCOUNTER — Ambulatory Visit: Payer: BLUE CROSS/BLUE SHIELD | Admitting: Occupational Therapy

## 2018-01-15 ENCOUNTER — Ambulatory Visit: Payer: BLUE CROSS/BLUE SHIELD | Admitting: Physical Therapy

## 2018-01-15 ENCOUNTER — Encounter: Payer: Self-pay | Admitting: Occupational Therapy

## 2018-01-15 DIAGNOSIS — R41842 Visuospatial deficit: Secondary | ICD-10-CM

## 2018-01-15 DIAGNOSIS — R2681 Unsteadiness on feet: Secondary | ICD-10-CM | POA: Diagnosis not present

## 2018-01-15 DIAGNOSIS — R270 Ataxia, unspecified: Secondary | ICD-10-CM

## 2018-01-15 DIAGNOSIS — R2689 Other abnormalities of gait and mobility: Secondary | ICD-10-CM

## 2018-01-15 DIAGNOSIS — R278 Other lack of coordination: Secondary | ICD-10-CM

## 2018-01-15 DIAGNOSIS — M6281 Muscle weakness (generalized): Secondary | ICD-10-CM

## 2018-01-15 NOTE — Therapy (Addendum)
[facility] [address] [place], [place], [address] Phone: [phone]   Fax:  [phone]  Occupational Therapy Treatment  Patient Details  Name: John Mcbride MRN: [id number] Date of Birth: 11/13/1965 No data recorded  Encounter Date: 01/15/2018  OT End of Session - 01/15/18 1647    Visit Number  9    Number of Visits  10    Date for OT Re-Evaluation  01/15/18    Authorization Type  BCBS - pt has 30 visits combined for PT and OT. Pt wishes to use 20 visits for PT and 10 visits for OT    OT Start Time  1448    OT Stop Time  1528    OT Time Calculation (min)  40 min       Past Medical History:  Diagnosis Date  . Heart murmur   . Stroke [facility])     Past Surgical History:  Procedure Laterality Date  . PATENT FORAMEN OVALE(PFO) CLOSURE N/A 12/04/2017   Procedure: PATENT FORAMEN OVALE (PFO) CLOSURE;  Surgeon: Sherren Mocha, MD;  Location: [place] CV LAB;  Service: Cardiovascular;  Laterality: N/A;  . TEE WITHOUT CARDIOVERSION N/A 11/14/2017   Procedure: TRANSESOPHAGEAL ECHOCARDIOGRAM (TEE);  Surgeon: Fay Records, MD;  Location: [facility] ENDOSCOPY;  Service: Cardiovascular;  Laterality: N/A;  bubble study    There were no vitals filed for this visit.  Subjective Assessment - 01/15/18 1449    Subjective   My hand is doing pretty well - I still need to work on my balance (PT to continue with pt)    Pertinent History  Pt with cerebellar CVA with ataxia on 11/12/2017.  Discharged home 11/21/2017 after inpt rehab stay.  PMH: HLD, possible alcohol use, Heart murmur    Patient Stated Goals  Get to where I can walk a little better. Staying at my parents right now.    Currently in Pain?  No/denies                   OT Treatments/Exercises (OP) - 01/15/18 0001      ADLs   ADL Comments  Addressed work related tasks including carrying up to 30 pounds short distances, climbing ladder and placing 5, t0, 15 and  then 20 pound object up on shelf, carrying ladder and setting and taking down.  Pt with increased ataxia and discontrol of LE with increased demand however was able to maintain balance.  Also finalized goals and addressed fine motor task.  Pt has met all OT goals and will d/c from OT today. Pt in agreement.                OT Short Term Goals - 01/15/18 1646      OT SHORT TERM GOAL #1   Title  Pt will be mod I with HEP (LUE coordination, grip strength, balance) - 01/06/2018 (goal date adjusted based on pt's attendance)    Status  Achieved      OT SHORT TERM GOAL #2   Title  Pt will demonstrate improved fine motor coordination for LUE evidenced by decreasing time on 9 hole peg by 10 seconds (baseline = 51.30) to assist with self care tasks    Status  Partially Met   01/01/2018  42.26     OT SHORT TERM GOAL #3   Title  Pt will demonstrate improved grip strength by at least 5 pounds to assist with picking up heavy objects in prep to return  to work    Status  Achieved   01/05/2018  95 pounds     OT SHORT TERM GOAL #4   Title  Pt will demonstrate ability to do bilateral overhead tasks in standing with normal BOS with close supervision (i.e putting away dishes on top shelf of cabinet)    Status  Achieved      OT SHORT TERM GOAL #5   Title  Further assess visual vestibular integration and set goal prn    Status  Achieved      OT SHORT TERM GOAL #6   Title  Pt will be able to pick up light item from low shelf with no more than supervision    Status  Achieved        OT Long Term Goals - 01/15/18 1647      OT LONG TERM GOAL #1   Title  Pt will be mod I with upgraded HEP - 01/13/2018 date adjusted based on pt's attendance  (pt has 30 visits combined for PT and OT and wishes to use 10 visits for OT    Status  Achieved      OT LONG TERM GOAL #2   Title  Pt will demonstrate improved coordination LUE as evidenced by decreasing time on 9 hole peg by at least 13 seconds to 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     Status  Achieved            Plan - 01/15/18 1647    Clinical Impression Statement  Pt has met all goals and is ready for d/c from OT.  Pt to continue with PT.      Occupational Profile and client history currently impacting functional performance  son, friend, employee, father. PMH: HLD, possible alcohol use, heart murmur    Occupational performance deficits (Please refer to evaluation for details):  ADL's;IADL's;Work;Leisure;Social Participation    Rehab Potential  Good    OT Frequency  2x / week    OT Duration  Other (comment)   5 weeks   OT Treatment/Interventions  Self-care/ADL training;Aquatic Therapy;DME and/or AE instruction;Neuromuscular education;Therapeutic exercise;Therapist, nutritional;Therapeutic activities;Balance training;Patient/family education    Plan  d/c from OT    Consulted and Agree with Plan of Care  Patient       Patient will benefit from skilled therapeutic intervention in order to improve the following deficits and impairments:  Abnormal gait, Decreased balance, Decreased coordination, Decreased knowledge of use of DME, Decreased mobility, Difficulty walking, Decreased strength, Impaired UE functional use  Visit Diagnosis: Ataxia  Other lack of coordination  Unsteadiness on feet  Muscle weakness (generalized)  Visuospatial deficit    Problem List Patient Active Problem List   Diagnosis Date Noted  . PFO (patent foramen ovale) 12/03/2017  . Cerebellar ataxia ([place]) 11/14/2017  .  Cerebellar infarct ([place])   . Essential hypertension   . Cerebellar stroke ([place]) 11/12/2017  .  Nausea and vomiting 11/12/2017  . Dizziness and giddiness 11/12/2017  . Hyperglycemia 11/12/2017  . Leukocytosis 11/12/2017  . Alcohol use 11/12/2017   OCCUPATIONAL THERAPY DISCHARGE SUMMARY  Visits from Start of Care: 9  Current functional level related to goals / functional outcomes: See above    Remaining deficits: Higher level balance, ataxia (PT to continue with PT)   Education / Equipment: HEP Plan: Patient agrees to discharge.  Patient goals were met. Patient is being discharged due to meeting the stated rehab goals.  ?????      Quay Burow, OTR/L 01/15/2018, 4:50 PM  [facility] [address] [place], [place], [address] Phone: [phone]   Fax:  [phone]  Name: John Mcbride MRN: [id number] Date of Birth: [birth year]

## 2018-01-15 NOTE — Therapy (Addendum)
[facility] [address] [place], [place], [address] Phone: [phone]   Fax:  [phone]  Physical Therapy Treatment  Patient Details  Name: John Mcbride MRN: [id number] Date of Birth: 06-May-1965 Referring Provider (PT): Dr. Alysia Penna   Encounter Date: 01/15/2018  PT End of Session - 01/15/18 1621    Visit Number  7    Number of Visits  17    Date for PT Re-Evaluation  01/27/18    Authorization Type  [employer]     Authorization Time Period  PT/OT/Chiro 30 visit limit; each visit counts, even if seen on same day    Authorization - Visit Number  16   9 OT visits used, D/C from OT   Authorization - Number of Visits  30    PT Start Time  1407    PT Stop Time  1445    PT Time Calculation (min)  38 min    Activity Tolerance  Patient tolerated treatment well    Behavior During Therapy  [facility] for tasks assessed/performed       Past Medical History:  Diagnosis Date  . Heart murmur   . Stroke [facility])     Past Surgical History:  Procedure Laterality Date  . PATENT FORAMEN OVALE(PFO) CLOSURE N/A 12/04/2017   Procedure: PATENT FORAMEN OVALE (PFO) CLOSURE;  Surgeon: Sherren Mocha, MD;  Location: [place] CV LAB;  Service: Cardiovascular;  Laterality: N/A;  . TEE WITHOUT CARDIOVERSION N/A 11/14/2017   Procedure: TRANSESOPHAGEAL ECHOCARDIOGRAM (TEE);  Surgeon: Fay Records, MD;  Location: [facility] ENDOSCOPY;  Service: Cardiovascular;  Laterality: N/A;  bubble study    There were no vitals filed for this visit.  Subjective Assessment - 01/15/18 1409    Subjective  Was a little sore after last session but not too bad.      Pertinent History  HTN, alcohol use    Patient Stated Goals  "Be more balanced and walk without the walker."     Currently in Pain?  No/denies         [facility] PT Assessment - 01/15/18 1411      Ambulation/Gait   Ambulation/Gait  Yes    Ambulation/Gait Assistance  6: Modified independent  (Device/Increase time);5: Supervision    Ambulation/Gait Assistance Details  outside over paved surfaces, gravel and mulch incorporating higher level tasks of head turns, up/down curbs, narrow space between cars, increasing gait velocity to cross "street", stepping over parking bumper and performing head turns.  MOD I with cane over paved surfaces with intermittent L foot drag, supervision over softer/compliant surfaces with cane    Ambulation Distance (Feet)  700 Feet    Assistive device  Straight cane    Gait Pattern  Poor foot clearance - left;Ataxic    Ambulation Surface  Unlevel;Outdoor;Paved;Gravel;Other (comment)   mulch     Standardized Balance Assessment   Standardized Balance Assessment  Berg Balance Test;Timed Up and Go Test;10 meter walk test    10 Meter Walk  9.53 seconds with cane or 3.44 ft/sec; without cane 9.28 seconds or 3.53 ft/sec      Berg Balance Test   Sit to Stand  Able to stand without using hands and stabilize independently    Standing Unsupported  Able to stand safely 2 minutes    Sitting with Back Unsupported but Feet Supported on Floor or Stool  Able to sit safely and securely 2 minutes    Stand to Sit  Sits safely with minimal use  of hands    Transfers  Able to transfer safely, minor use of hands    Standing Unsupported with Eyes Closed  Able to stand 10 seconds safely    Standing Ubsupported with Feet Together  Able to place feet together independently and stand 1 minute safely    From Standing, Reach Forward with Outstretched Arm  Can reach confidently >25 cm (10")    From Standing Position, Pick up Object from Floor  Able to pick up shoe safely and easily    From Standing Position, Turn to Look Behind Over each Shoulder  Looks behind from both sides and weight shifts well    Turn 360 Degrees  Able to turn 360 degrees safely in 4 seconds or less    Standing Unsupported, Alternately Place Feet on Step/Stool  Able to complete 4 steps without aid or supervision     Standing Unsupported, One Foot in [place] to place foot tandem independently and hold 30 seconds    Standing on One Leg  Able to lift leg independently and hold > 10 seconds    Total Score  54    Berg comment:  54/56      Timed Up and Go Test   TUG  Normal TUG    Normal TUG (seconds)  10.97   10.97 with cane; 9.06 without cane -supervision with turns                          PT Education - 01/15/18 1620    Education Details  progress towards STG; areas to continue to focus on with therapy    Person(s) Educated  Patient    Methods  Explanation    Comprehension  Verbalized understanding       PT Short Term Goals - 01/15/18 1426      PT SHORT TERM GOAL #1   Title  Pt will initiate HEP in order to indicate improved balance and coordination. (Target Date: 01/17/18-updated to reflect 3 week delay in start)    Time  4    Period  Weeks    Status  Achieved      PT SHORT TERM GOAL #2   Title  Pt will improve BERG balance test to 51/56 in order to indicate decreased fall risk.     Baseline  54/56    Status  Achieved      PT SHORT TERM GOAL #3   Title  Pt will perform TUG <13.5 secs without AD in order to indicate decreased fall risk.     Baseline  9 seconds without cane    Status  Achieved      PT SHORT TERM GOAL #4   Title  Pt will ambulate over unlevel outdoor surfaces (paved and grass) with SPC at mod I level in order to indicate improved functional and community independence.     Baseline  MOD I paved, supervision compliant surfaces    Status  Partially Met      PT SHORT TERM GOAL #5   Title  Pt will improve gait speed to >/=2.62 ft/sec with LRAD in order to indicate improved community ambulation.     Baseline  3.4-3.5 ft/sec with and without cane    Status  Achieved        PT Long Term Goals - 01/15/18 1628      PT LONG TERM GOAL #1   Title  Pt will be independent with final HEP in  order to indicate improved balance and coordination.  (Target  Date: 02/16/18-updated to relfect 3 week delay in start)    Time  8    Period  Weeks    Status  New    Target Date  02/16/18      PT LONG TERM GOAL #2   Title  Pt will perform FGA as appropriate and improve score by 4 points.      Status  New    Target Date  02/16/18      PT LONG TERM GOAL #3   Title  Pt will improve gait speed to 3.8 ft/sec without AD in order to indicate decreased fall risk and improved efficiency of gait.     Baseline  3.4-3.5 with and without cane    Status  Revised    Target Date  02/16/18      PT LONG TERM GOAL #4   Title  Pt will ambulate >1000' over varying outdoor surfaces without AD at independent level in order to indicate safe return to leisure and community activity.      Status  New    Target Date  02/16/18      PT LONG TERM GOAL #5   Title  Pt will be able to carry 20# for 20' demonstrating safe body mechanics when lifting and placing to ground in order to indicate safe return to work.     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      Status  New            Plan - 01/15/18 1443    Clinical Impression Statement  Performed re-assessment of objective measures to determine progress towards goals.  Pt has met 4/5 STG with significant improvements in gait velocity with and without AD, decreased time to perform TUG with and without AD and increase in BERG score indicating lower falls risk.  Pt partially met outdoor ambulation goal with cane; MOD I for paved surfaces but supervision for compliant/uneven surfaces due to ongoing/intermittent L foot drag and decreased coordination.  Pt is independent with current HEP.   Will assess FGA next session.  Pt is making excellent progress.  Will continue to address impairments in LE strength, coordination, balance, and gait.    PT  Treatment/Interventions  ADLs/Self Care Home Management;Aquatic Therapy;Electrical Stimulation;DME Instruction;Gait training;Stair training;Functional mobility training;Therapeutic activities;Therapeutic exercise;Balance training;Neuromuscular re-education;Patient/family education;Vestibular    PT Next Visit Plan  DO FGA and reset LTG; dual task during gait.  FOCUS a lot on climbing ladders safely, carrying heavy items, reaching down from ladder to grab heavy items to simulate work.  continue with closed chain L motor control (quadruped/tall kneeling-esp transitions to L side sit and back without UE support).   also see if he put an insert in his right shoe and see how it is affecting his walking;    PT Home Exercise Plan  Will have to recert at end of POC to reflect 3 week delay in start    Consulted and Agree with Plan of Care  Patient       Patient will benefit from skilled therapeutic intervention in order to improve the following deficits and impairments:  Abnormal gait, Decreased activity tolerance, Decreased balance, Decreased  coordination, Decreased endurance, Decreased mobility, Decreased knowledge of use of DME, Dizziness, Impaired perceived functional ability, Impaired vision/preception  Visit Diagnosis: Other lack of coordination  Unsteadiness on feet  Other abnormalities of gait and mobility  Muscle weakness (generalized)  Ataxia     Problem List Patient Active Problem List   Diagnosis Date Noted  . PFO (patent foramen ovale) 12/03/2017  . 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 Cerebellar infarct ([place])   . Essential hypertension   . Cerebellar stroke ([place]) 11/12/2017  . Nausea and vomiting 11/12/2017  . Dizziness and giddiness 11/12/2017  . Hyperglycemia 11/12/2017  . Leukocytosis 11/12/2017  . Alcohol use 11/12/2017    Rico Junker, PT, DPT 01/15/18    5:14 PM    [facility] [address] [place], [place], [address] Phone: [phone]   Fax:  [phone]  Name: ROHIL LESCH MRN: [id number] Date of Birth: January 29, 1966

## 2018-01-19 ENCOUNTER — Encounter: Payer: BLUE CROSS/BLUE SHIELD | Admitting: Occupational Therapy

## 2018-01-21 ENCOUNTER — Ambulatory Visit: Payer: BLUE CROSS/BLUE SHIELD | Admitting: Physical Therapy

## 2018-01-22 ENCOUNTER — Encounter: Payer: BLUE CROSS/BLUE SHIELD | Admitting: Occupational Therapy

## 2018-01-22 ENCOUNTER — Encounter: Payer: Self-pay | Admitting: Physical Medicine & Rehabilitation

## 2018-01-22 ENCOUNTER — Encounter: Payer: BLUE CROSS/BLUE SHIELD | Attending: Physical Medicine & Rehabilitation

## 2018-01-22 ENCOUNTER — Ambulatory Visit (HOSPITAL_BASED_OUTPATIENT_CLINIC_OR_DEPARTMENT_OTHER): Payer: BLUE CROSS/BLUE SHIELD | Admitting: Physical Medicine & Rehabilitation

## 2018-01-22 VITALS — BP 117/79 | HR 92 | Ht 71.0 in | Wt 229.0 lb

## 2018-01-22 DIAGNOSIS — R269 Unspecified abnormalities of gait and mobility: Secondary | ICD-10-CM | POA: Diagnosis not present

## 2018-01-22 DIAGNOSIS — Z7982 Long term (current) use of aspirin: Secondary | ICD-10-CM | POA: Insufficient documentation

## 2018-01-22 DIAGNOSIS — G119 Hereditary ataxia, unspecified: Secondary | ICD-10-CM | POA: Diagnosis not present

## 2018-01-22 DIAGNOSIS — I69398 Other sequelae of cerebral infarction: Secondary | ICD-10-CM | POA: Insufficient documentation

## 2018-01-22 DIAGNOSIS — Z7902 Long term (current) use of antithrombotics/antiplatelets: Secondary | ICD-10-CM | POA: Insufficient documentation

## 2018-01-22 DIAGNOSIS — Z8249 Family history of ischemic heart disease and other diseases of the circulatory system: Secondary | ICD-10-CM | POA: Diagnosis not present

## 2018-01-22 DIAGNOSIS — I639 Cerebral infarction, unspecified: Secondary | ICD-10-CM | POA: Diagnosis not present

## 2018-01-22 DIAGNOSIS — I69393 Ataxia following cerebral infarction: Secondary | ICD-10-CM | POA: Insufficient documentation

## 2018-01-22 NOTE — Patient Instructions (Signed)
Will need to finish PT prior to return to work  Need a job description so I can write restrictions

## 2018-01-22 NOTE — Progress Notes (Signed)
Subjective:    Patient ID: John Mcbride, male    DOB: 12-14-1965, 52 y.o.   MRN: [id number] 52 year old right-handed male with question history of alcohol use on no prescription medication who lives with a roommate.  Independent prior to admission.  Working full time.  Presented 11/12/2017 with nausea and vomiting  as well as dizziness and incoordination.  Cranial CT scan showed suggestion of asymmetrical low attenuation and possible mass effect in the left anterior cerebellum and cerebellar peduncle.  MRI showed a 3.5 cm diameter acute infarction in the superior  cerebellum on the left.  No evidence of mass effect or hemorrhage.  CT angiogram of head and neck with no large vessel occlusion.  The patient did not receive tPA.  Echocardiogram with ejection fraction of 65%, no wall motion abnormalities.  Neurology  consulted.  Maintained on aspirin and Plavix therapy. HPI Practiced lifting and carrying up to 30lb, pt reported no LOB but didn't feel as steady as normal.  Still uses cane both inside and outside the house Used and climbed up 6' ladder at OT session Pt states he can get help or use equipment for components that weigh more than 30lb  Pt has hx leg length discrepency due to right displaced tib/fib fx fracture treated non operatively   Pain Inventory Average Pain 1 Pain Right Now 1 My pain is aching  In the last 24 hours, has pain interfered with the following? General activity 0 Relation with others 0 Enjoyment of life 1 What TIME of day is your pain at its worst? daytime Sleep (in general) Good  Pain is worse with: walking Pain improves with: na Relief from Meds: na  Mobility use a cane ability to climb steps?  yes do you drive?  yes Do you have any goals in this area?  yes  Function employed # of hrs/week 40  Neuro/Psych trouble walking  Prior Studies Any changes since last visit?  no  Physicians involved in your care Any changes since last visit?   no   Family History  Problem Relation Age of Onset  . Cancer Mother   . Cancer Father   . Heart disease Father    Social History   Socioeconomic History  . Marital status: Single    Spouse name: Not on file  . Number of children: Not on file  . Years of education: Not on file  . Highest education level: Not on file  Occupational History  . 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 Financial resource strain: Not hard at all  . Food insecurity:    Worry: Never true    Inability: Never true  . Transportation needs:    Medical: No    Non-medical: No  Tobacco Use  . Smoking status: Never Smoker  . Smokeless tobacco: Never Used  Substance and Sexual Activity  . Alcohol use: Yes    Alcohol/week: 10.0 standard drinks    Types: 10 Standard drinks or equivalent per week  . Drug use: No  . Sexual activity: Not on file  Lifestyle  . Physical activity:    Days per week: 0 days    Minutes per session: 0 min  . Stress: Not at all  Relationships  . Social connections:    Talks on phone: More than three times a week    Gets together: More than three times a week    Attends religious service: Patient refused    Active  member of club or organization: Patient refused    Attends meetings of clubs or organizations: Patient refused    Relationship status: Patient refused  Other Topics Concern  . Not on file  Social History Narrative  . Not on file   Past Surgical History:  Procedure Laterality Date  . PATENT FORAMEN OVALE(PFO) CLOSURE N/A 12/04/2017   Procedure: PATENT FORAMEN OVALE (PFO) CLOSURE;  Surgeon: [name], MD;  Location: [facility] INVASIVE CV LAB;  Service: Cardiovascular;  Laterality: N/A;  . TEE WITHOUT CARDIOVERSION N/A 11/14/2017   Procedure: TRANSESOPHAGEAL ECHOCARDIOGRAM (TEE);  Surgeon: Pricilla Riffle, MD;  Location: [facility] ENDOSCOPY;  Service: Cardiovascular;  Laterality: N/A;  bubble study   Past Medical History:  Diagnosis Date  . Heart  murmur   . Stroke ([facility])    BP 117/79   Pulse 92   Ht 5\' 11"  (1.803 m)   Wt 229 lb (103.9 kg)   SpO2 97%   BMI 31.94 kg/m   Opioid Risk Score:   Fall Risk Score:  `1  Depression screen PHQ 2/9  Depression screen [facility] 2/9 11/12/2017 01/26/2015  Decreased Interest 0 0  Down, Depressed, Hopeless 0 0  PHQ - 2 Score 0 0    Review of Systems  Constitutional: Negative.   HENT: Negative.   Eyes: Negative.   Respiratory: Negative.   Gastrointestinal: Negative.   Endocrine: Negative.   Genitourinary: Negative.   Musculoskeletal: Positive for gait problem.  Skin: Negative.   Allergic/Immunologic: Negative.   Hematological: Negative.   Psychiatric/Behavioral: Negative.   All other systems reviewed and are negative.      Objective:   Physical Exam  Constitutional: He is oriented to person, place, and time. He appears well-developed and well-nourished. No distress.  HENT:  Head: Normocephalic and atraumatic.  Eyes: Pupils are equal, round, and reactive to light. EOM are normal.  Musculoskeletal: Normal range of motion.  Neurological: He is alert and oriented to person, place, and time.  Skin: Skin is warm and dry. He is not diaphoretic.  Nursing note and vitals reviewed. Patient has wide-based support he has a mild lurch on the right side during swing phase on the left side. Motor strength is 5/5 bilateral deltoid, bicep, tricep, grip, hip flexor, knee extensor, ankle dorsiflexion There is mild slowing of finger thumb opposition on left side compared to the right side No evidence of tremor in the left knee Ambulates without a cane     Assessment & Plan:  1.  Left cerebellar infarct with improvement in gait imbalance as well as ataxia. Still working on higher-level balance to ensure return to work.  At this point I would like to see him back in 4 weeks after he completes his outpatient physical therapy and determine any work restrictions at that time I would like to get a copy  of his work duties

## 2018-01-23 ENCOUNTER — Ambulatory Visit: Payer: BLUE CROSS/BLUE SHIELD | Admitting: Rehabilitation

## 2018-01-23 ENCOUNTER — Encounter: Payer: Self-pay | Admitting: Rehabilitation

## 2018-01-23 DIAGNOSIS — M6281 Muscle weakness (generalized): Secondary | ICD-10-CM | POA: Diagnosis not present

## 2018-01-23 DIAGNOSIS — R2681 Unsteadiness on feet: Secondary | ICD-10-CM

## 2018-01-23 DIAGNOSIS — R278 Other lack of coordination: Secondary | ICD-10-CM | POA: Diagnosis not present

## 2018-01-23 DIAGNOSIS — R2689 Other abnormalities of gait and mobility: Secondary | ICD-10-CM

## 2018-01-23 DIAGNOSIS — R270 Ataxia, unspecified: Secondary | ICD-10-CM | POA: Diagnosis not present

## 2018-01-23 DIAGNOSIS — R41842 Visuospatial deficit: Secondary | ICD-10-CM | POA: Diagnosis not present

## 2018-01-23 NOTE — Therapy (Signed)
[facility] [address] [place], [place], [address] Phone: [phone]   Fax:  [phone]  Physical Therapy Treatment  Patient Details  Name: John Mcbride MRN: [id number] Date of Birth: 1965/12/31 Referring Provider (PT): Dr. Alysia Penna   Encounter Date: 01/23/2018  PT End of Session - 01/23/18 1403    Visit Number  8    Number of Visits  17    Date for PT Re-Evaluation  02/22/18   updated to reflect date extension on cert   Authorization Type  [employer]     Authorization Time Period  PT/OT/Chiro 30 visit limit; each visit counts, even if seen on same day    Authorization - Visit Number  93   9 OT visits used, D/C from OT   Authorization - Number of Visits  30    PT Start Time  1402    PT Stop Time  1447    PT Time Calculation (min)  45 min    Activity Tolerance  Patient tolerated treatment well    Behavior During Therapy  [facility] for tasks assessed/performed       Past Medical History:  Diagnosis Date  . Heart murmur   . Stroke [facility])     Past Surgical History:  Procedure Laterality Date  . PATENT FORAMEN OVALE(PFO) CLOSURE N/A 12/04/2017   Procedure: PATENT FORAMEN OVALE (PFO) CLOSURE;  Surgeon: Sherren Mocha, MD;  Location: [facility] CV LAB;  Service: Cardiovascular;  Laterality: N/A;  . TEE WITHOUT CARDIOVERSION N/A 11/14/2017   Procedure: TRANSESOPHAGEAL ECHOCARDIOGRAM (TEE);  Surgeon: Fay Records, MD;  Location: [facility] ENDOSCOPY;  Service: Cardiovascular;  Laterality: N/A;  bubble study    There were no vitals filed for this visit.  Subjective Assessment - 01/23/18 1401    Subjective  Pt reports doing well.  Saw Dr. Letta Pate and will hopefully be able to return to work at some capacity after completing PT.     Pertinent History  HTN, alcohol use    Limitations  House hold activities;Walking    Patient Stated Goals  "Be more balanced and walk without the walker."          [facility] PT Assessment -  01/23/18 1407      Functional Gait  Assessment   Gait assessed   Yes    Gait Level Surface  Walks 20 ft in less than 7 sec but greater than 5.5 sec, uses assistive device, slower speed, mild gait deviations, or deviates 6-10 in outside of the 12 in walkway width.   5.62 secs    Change in Gait Speed  Able to change speed, demonstrates mild gait deviations, deviates 6-10 in outside of the 12 in walkway width, or no gait deviations, unable to achieve a major change in velocity, or uses a change in velocity, or uses an assistive device.    Gait with Horizontal Head Turns  Performs head turns smoothly with no change in gait. Deviates no more than 6 in outside 12 in walkway width    Gait with Vertical Head Turns  Performs task with slight change in gait velocity (eg, minor disruption to smooth gait path), deviates 6 - 10 in outside 12 in walkway width or uses assistive device    Gait and Pivot Turn  Pivot turns safely within 3 sec and stops quickly with no loss of balance.    Step Over Obstacle  Is able to step over one shoe box (4.5 in total height) without  changing gait speed. No evidence of imbalance.    Gait with Narrow Base of Support  Is able to ambulate for 10 steps heel to toe with no staggering.    Gait with Eyes Closed  Walks 20 ft, no assistive devices, good speed, no evidence of imbalance, normal gait pattern, deviates no more than 6 in outside 12 in walkway width. Ambulates 20 ft in less than 7 sec.    Ambulating Backwards  Walks 20 ft, uses assistive device, slower speed, mild gait deviations, deviates 6-10 in outside 12 in walkway width.    Steps  Alternating feet, no rail.    Total Score  25                   [facility] Adult PT Treatment/Exercise - 01/23/18 1405      Ambulation/Gait   Ambulation/Gait  Yes    Ambulation/Gait Assistance  6: Modified independent (Device/Increase time);5: Supervision    Ambulation/Gait Assistance Details  Continue to note L knee instability with  gait, however it does seemed improved overall.  Continues to have intermittent L knee hyperextension, but pt reports only has pain if has been ambulating for longer periods and he then sits to rest.   Pt reports that he may be able to "mess" with his hinged knee brace at home so that it will fall short of full extension.  To simulate this in clinic donned recurvatum wrap around knee and this provided marked support during gait.  Continue to note drop in hip on R side.  Pt reminded PT about leg length descrepancy from past injury.  He notes that it is approx 1/2" shorter on the R.  Therefore had pt place small heel wedge in R shoe.  This made gait more fluid.  Pt willing to try larger heel wedge, however this seemed to cause more instability, therefore switched back to small heel wedge.  Provided education that PT could show him other knee brace options if he is unable to adjust his at home.      Ambulation Distance (Feet)  400 Feet    Assistive device  None    Gait Pattern  Poor foot clearance - left;Ataxic    Ambulation Surface  Level;Indoor      Therapeutic Activites    Therapeutic Activities  Work [employer]    Work [employer]  Pushing weighted cart x 150' negotiating turns at [employer] level for improved control esp when making turns, retrieving, climbing ladder with 10lb weight and replacing in storage closet with min cues for LLE control with ascent up ladder,  lifting crate with 20lbs from floor, placing on floor and carrying x 200' with min cues for improved body mechanics.        Neuro Re-ed    Neuro Re-ed Details   RLE WB to decrease ataxia and provide improved proprioceptive input: quadruped RLE fire hydrant x 10 reps progressing to RLE extension x 10 reps>RLE extension along with LUE extension x 10 reps.  Pt demos great stabilization on LLE during exercise.  Pt also able to perform transitional movements from R and L side sit back to quadruped with single UE support only x 5 reps on each side.                PT Education - 01/23/18 1403    Education Details  Education on bracing for L knee    Person(s) Educated  Patient    Methods  Explanation    Comprehension  Verbalized understanding       PT Short Term Goals - 01/15/18 1426      PT SHORT TERM GOAL #1   Title  Pt will initiate HEP in order to indicate improved balance and coordination. (Target Date: 01/17/18-updated to reflect 3 week delay in start)    Time  4    Period  Weeks    Status  Achieved      PT SHORT TERM GOAL #2   Title  Pt will improve BERG balance test to 51/56 in order to indicate decreased fall risk.     Baseline  54/56    Status  Achieved      PT SHORT TERM GOAL #3   Title  Pt will perform TUG <13.5 secs without AD in order to indicate decreased fall risk.     Baseline  9 seconds without cane    Status  Achieved      PT SHORT TERM GOAL #4   Title  Pt will ambulate over unlevel outdoor surfaces (paved and grass) with SPC at mod I level in order to indicate improved functional and community independence.     Baseline  MOD I paved, supervision compliant surfaces    Status  Partially Met      PT SHORT TERM GOAL #5   Title  Pt will improve gait speed to >/=2.62 ft/sec with LRAD in order to indicate improved community ambulation.     Baseline  3.4-3.5 ft/sec with and without cane    Status  Achieved        PT Long Term Goals - 01/23/18 1607      PT LONG TERM GOAL #1   Title  Pt will be independent with final HEP in order to indicate improved balance and coordination.  (Target Date: 02/22/18-updated to relfect 3 week delay in start)    Time  8    Period  Weeks    Status  New    Target Date  02/22/18      PT LONG TERM GOAL #2   Title  Pt will perform FGA as appropriate and improve score by 4 points.      Status  New      PT LONG TERM GOAL #3   Title  Pt will improve gait speed to 3.8 ft/sec without AD in order to indicate decreased fall risk and improved efficiency of gait.     Baseline   3.4-3.5 with and without cane    Status  Revised      PT LONG TERM GOAL #4   Title  Pt will ambulate >1000' over varying outdoor surfaces without AD at independent level in order to indicate safe return to leisure and community activity.      Status  New      PT LONG TERM GOAL #5   Title  Pt will be able to carry 20# for 20' demonstrating safe body mechanics when lifting and placing to ground in order to indicate safe return to work.     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      Status  New            Plan - 01/23/18 1603    Clinical Impression Statement  Skilled session continued  to focus on work simulation tasks, LLE WB/proprioceptive feedback and improved L knee control during gait.  Feel that pt will likely benefit from some sort of hinged knee brace that can be cross strapped posteriorly to prevent hyperextension.  He is going to try and modify his brace at home. Due to delay in start and pt continuing to make progress towards LTGs, will plan to re-cert for another 4 weeks (2x/wk) in order to progress towards LTGs.     PT Frequency  2x / week    PT Duration  4 weeks    PT Treatment/Interventions  ADLs/Self Care Home Management;Aquatic Therapy;Electrical Stimulation;DME Instruction;Gait training;Stair training;Functional mobility training;Therapeutic activities;Therapeutic exercise;Balance training;Neuromuscular re-education;Patient/family education;Vestibular    PT Next Visit Plan  Was he able to modify his knee brace?dual task during gait.  FOCUS a lot on climbing ladders safely, carrying heavy items, reaching down from ladder to grab heavy items to simulate work.  continue with closed chain L motor control (quadruped/tall kneeling-esp transitions to L side sit and back without UE support).   also see if he put an insert in  his right shoe and see how it is affecting his walking;    Consulted and Agree with Plan of Care  Patient       Patient will benefit from skilled therapeutic intervention in order to improve the following deficits and impairments:  Abnormal gait, Decreased activity tolerance, Decreased balance, Decreased coordination, Decreased endurance, Decreased mobility, Decreased knowledge of use of DME, Dizziness, Impaired perceived functional ability, Impaired vision/preception  Visit Diagnosis: Other lack of coordination  Unsteadiness on feet  Other abnormalities of gait and mobility  Muscle weakness (generalized)     Problem List Patient Active Problem List   Diagnosis Date Noted  . PFO (patent foramen ovale) 12/03/2017  . Cerebellar ataxia ([place]) 11/14/2017  . Cerebellar infarct ([place])   . Essential hypertension   . Cerebellar stroke ([place]) 11/12/2017  . Nausea and vomiting 11/12/2017  . Dizziness and giddiness 11/12/2017  . Hyperglycemia 11/12/2017  . Leukocytosis 11/12/2017  . Alcohol use 11/12/2017    Ricca Melgarejo, Betha Loa 01/23/2018, 4:09 PM  [facility] [address] [place], [place], [address] Phone: [phone]   Fax:  [phone]  Name: John Mcbride MRN: [id number] Date of Birth: 03/31/1966

## 2018-01-28 ENCOUNTER — Encounter: Payer: Self-pay | Admitting: Physical Therapy

## 2018-01-28 ENCOUNTER — Ambulatory Visit: Payer: BLUE CROSS/BLUE SHIELD | Admitting: Physical Therapy

## 2018-01-28 DIAGNOSIS — R2689 Other abnormalities of gait and mobility: Secondary | ICD-10-CM

## 2018-01-28 DIAGNOSIS — M6281 Muscle weakness (generalized): Secondary | ICD-10-CM | POA: Diagnosis not present

## 2018-01-28 DIAGNOSIS — R278 Other lack of coordination: Secondary | ICD-10-CM

## 2018-01-28 DIAGNOSIS — R41842 Visuospatial deficit: Secondary | ICD-10-CM | POA: Diagnosis not present

## 2018-01-28 DIAGNOSIS — R2681 Unsteadiness on feet: Secondary | ICD-10-CM

## 2018-01-28 DIAGNOSIS — R270 Ataxia, unspecified: Secondary | ICD-10-CM

## 2018-01-28 NOTE — Therapy (Signed)
[facility] [address] [place], [place], [address] Phone: [phone]   Fax:  [phone]  Physical Therapy Treatment  Patient Details  Name: John Mcbride MRN: [id number] Date of Birth: 07/10/1965 Referring Provider (PT): Dr. Alysia Penna   Encounter Date: 01/28/2018  PT End of Session - 01/28/18 1556    Visit Number  9    Number of Visits  17    Date for PT Re-Evaluation  02/22/18   updated to reflect date extension on cert   Authorization Type  [employer]     Authorization Time Period  PT/OT/Chiro 30 visit limit; each visit counts, even if seen on same day    Authorization - Visit Number  18   9 OT visits used, D/C from OT   Authorization - Number of Visits  30    PT Start Time  1404    PT Stop Time  1445    PT Time Calculation (min)  41 min    Activity Tolerance  Patient tolerated treatment well    Behavior During Therapy  [facility] for tasks assessed/performed       Past Medical History:  Diagnosis Date  . Heart murmur   . Stroke [facility])     Past Surgical History:  Procedure Laterality Date  . PATENT FORAMEN OVALE(PFO) CLOSURE N/A 12/04/2017   Procedure: PATENT FORAMEN OVALE (PFO) CLOSURE;  Surgeon: Sherren Mocha, MD;  Location: [facility] CV LAB;  Service: Cardiovascular;  Laterality: N/A;  . TEE WITHOUT CARDIOVERSION N/A 11/14/2017   Procedure: TRANSESOPHAGEAL ECHOCARDIOGRAM (TEE);  Surgeon: Fay Records, MD;  Location: [facility] ENDOSCOPY;  Service: Cardiovascular;  Laterality: N/A;  bubble study    There were no vitals filed for this visit.  Subjective Assessment - 01/28/18 1404    Subjective  Would like to keep working on walking and on the ladder.  Wearing L knee brace today.    Pertinent History  HTN, alcohol use    Limitations  House hold activities;Walking    Patient Stated Goals  "Be more balanced and walk without the walker."     Currently in Pain?  No/denies                        [facility] Adult PT Treatment/Exercise - 01/28/18 1407      Therapeutic Activites    Therapeutic Activities  Work Simulation    Work Simulation  Continued to review safe negotiation of ladder carrying 16 lb and then 20 lb up/down ladder turning and placing weighted items on top of cabinet to R and to the L.  Discussed ascending and descending ladder with step to sequence keeping RLE rotated to outside of ladder and keeping LLE straight to keep pelvis and COG close to ladder for improved safety      Exercises   Exercises  Knee/Hip      Knee/Hip Exercises: Aerobic   Tread Mill  x 8 minutes at 1.5 mph forwards with bilat UE support while performing head turns in various directions, reducing speed to 0.7 mph and performing without UE support with focus on increasing weight shift, increasing stance time and step length bilaterally.  Also performed 4 minutes walking backwards with bilat UE support focusing on hamstring activation, full weight shift and full step length             PT Education - 01/28/18 1556    Education Details  ladder education    Person(s) Educated  Patient    Methods  Explanation    Comprehension  Verbalized understanding       PT Short Term Goals - 01/15/18 1426      PT SHORT TERM GOAL #1   Title  Pt will initiate HEP in order to indicate improved balance and coordination. (Target Date: 01/17/18-updated to reflect 3 week delay in start)    Time  4    Period  Weeks    Status  Achieved      PT SHORT TERM GOAL #2   Title  Pt will improve BERG balance test to 51/56 in order to indicate decreased fall risk.     Baseline  54/56    Status  Achieved      PT SHORT TERM GOAL #3   Title  Pt will perform TUG <13.5 secs without AD in order to indicate decreased fall risk.     Baseline  9 seconds without cane    Status  Achieved      PT SHORT TERM GOAL #4   Title  Pt will ambulate over unlevel outdoor surfaces (paved and grass) with  SPC at mod I level in order to indicate improved functional and community independence.     Baseline  MOD I paved, supervision compliant surfaces    Status  Partially Met      PT SHORT TERM GOAL #5   Title  Pt will improve gait speed to >/=2.62 ft/sec with LRAD in order to indicate improved community ambulation.     Baseline  3.4-3.5 ft/sec with and without cane    Status  Achieved        PT Long Term Goals - 01/23/18 1607      PT LONG TERM GOAL #1   Title  Pt will be independent with final HEP in order to indicate improved balance and coordination.  (Target Date: 02/22/18-updated to relfect 3 week delay in start)    Time  8    Period  Weeks    Status  New    Target Date  02/22/18      PT LONG TERM GOAL #2   Title  Pt will perform FGA as appropriate and improve score by 4 points.      Status  New      PT LONG TERM GOAL #3   Title  Pt will improve gait speed to 3.8 ft/sec without AD in order to indicate decreased fall risk and improved efficiency of gait.     Baseline  3.4-3.5 with and without cane    Status  Revised      PT LONG TERM GOAL #4   Title  Pt will ambulate >1000' over varying outdoor surfaces without AD at independent level in order to indicate safe return to leisure and community activity.      Status  New      PT LONG TERM GOAL #5   Title  Pt will be able to carry 20# for 20' demonstrating safe body mechanics when lifting and placing to ground in order to indicate safe return to work.     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      Status  New            Plan -  01/28/18 1446    Clinical Impression Statement  Continued work simulation training with ladder focusing on safer technique, reaching to L and R and carrying heavier loads up/down ladder.  Also focused on gait training on treadmill combined  with head turns in various directions, decreasing UE support and retro gait for increased hamstring activation and coordination.  Pt tolerated well; will continue to progress towards goals.    PT Frequency  2x / week    PT Duration  4 weeks    PT Treatment/Interventions  ADLs/Self Care Home Management;Aquatic Therapy;Electrical Stimulation;DME Instruction;Gait training;Stair training;Functional mobility training;Therapeutic activities;Therapeutic exercise;Balance training;Neuromuscular re-education;Patient/family education;Vestibular    PT Next Visit Plan  dual task during gait.  FOCUS a lot on climbing ladders safely, carrying heavy items, reaching down from ladder to grab heavy items to simulate work.  continue with closed chain L motor control (quadruped/tall kneeling-esp transitions to L side sit and back without UE support).     Consulted and Agree with Plan of Care  Patient       Patient will benefit from skilled therapeutic intervention in order to improve the following deficits and impairments:  Abnormal gait, Decreased activity tolerance, Decreased balance, Decreased coordination, Decreased endurance, Decreased mobility, Decreased knowledge of use of DME, Dizziness, Impaired perceived functional ability, Impaired vision/preception  Visit Diagnosis: Ataxia  Other lack of coordination  Unsteadiness on feet  Other abnormalities of gait and mobility  Muscle weakness (generalized)     Problem List Patient Active Problem List   Diagnosis Date Noted  . PFO (patent foramen ovale) 12/03/2017  . Cerebellar ataxia ([place]) 11/14/2017  . Cerebellar infarct ([place])   . Essential hypertension   . Cerebellar stroke ([place]) 11/12/2017  . Nausea and vomiting 11/12/2017  . Dizziness and giddiness 11/12/2017  . Hyperglycemia 11/12/2017  . Leukocytosis 11/12/2017  . Alcohol use 11/12/2017   Rico Junker, PT, DPT 01/28/18    4:22 PM    [facility] [address] [place], [place], [address] Phone: [phone]   Fax:  [phone]  Name: John Mcbride MRN: [id number] Date of Birth: [birth year]

## 2018-01-30 ENCOUNTER — Encounter: Payer: Self-pay | Admitting: Rehabilitation

## 2018-01-30 ENCOUNTER — Ambulatory Visit: Payer: BLUE CROSS/BLUE SHIELD | Attending: Physical Medicine & Rehabilitation | Admitting: Rehabilitation

## 2018-01-30 DIAGNOSIS — R270 Ataxia, unspecified: Secondary | ICD-10-CM | POA: Insufficient documentation

## 2018-01-30 DIAGNOSIS — R2689 Other abnormalities of gait and mobility: Secondary | ICD-10-CM | POA: Insufficient documentation

## 2018-01-30 DIAGNOSIS — M6281 Muscle weakness (generalized): Secondary | ICD-10-CM | POA: Insufficient documentation

## 2018-01-30 DIAGNOSIS — R2681 Unsteadiness on feet: Secondary | ICD-10-CM

## 2018-01-31 NOTE — Therapy (Signed)
[facility] [address] [place], [place], [address] Phone: [phone]   Fax:  [phone]  Physical Therapy Treatment  Patient Details  Name: John Mcbride MRN: [id number] Date of Birth: May 05, 1965 Referring Provider (PT): Dr. Alysia Penna   Encounter Date: 01/30/2018  PT End of Session - 01/30/18 1410    Visit Number  10    Number of Visits  17    Date for PT Re-Evaluation  02/22/18   updated to reflect date extension on cert   Authorization Type  [employer]     Authorization Time Period  PT/OT/Chiro 30 visit limit; each visit counts, even if seen on same day    Authorization - Visit Number  68   9 OT visits used, D/C from OT   Authorization - Number of Visits  30    PT Start Time  1402    PT Stop Time  1445    PT Time Calculation (min)  43 min    Activity Tolerance  Patient tolerated treatment well    Behavior During Therapy  [facility] for tasks assessed/performed       Past Medical History:  Diagnosis Date  . Heart murmur   . Stroke [facility])     Past Surgical History:  Procedure Laterality Date  . PATENT FORAMEN OVALE(PFO) CLOSURE N/A 12/04/2017   Procedure: PATENT FORAMEN OVALE (PFO) CLOSURE;  Surgeon: Sherren Mocha, MD;  Location: [facility] CV LAB;  Service: Cardiovascular;  Laterality: N/A;  . TEE WITHOUT CARDIOVERSION N/A 11/14/2017   Procedure: TRANSESOPHAGEAL ECHOCARDIOGRAM (TEE);  Surgeon: Fay Records, MD;  Location: [facility] ENDOSCOPY;  Service: Cardiovascular;  Laterality: N/A;  bubble study    There were no vitals filed for this visit.  Subjective Assessment - 01/30/18 1406    Subjective  I want to keep working on going up/down the ladder.  I feel like I do better at home than here because I get a little nervous here.     Pertinent History  HTN, alcohol use    Limitations  House hold activities;Walking    Patient Stated Goals  "Be more balanced and walk without the walker."     Currently in Pain?   No/denies         [facility] PT Assessment - 01/30/18 1433      Functional Gait  Assessment   Gait assessed   Yes    Gait Level Surface  Walks 20 ft in less than 5.5 sec, no assistive devices, good speed, no evidence for imbalance, normal gait pattern, deviates no more than 6 in outside of the 12 in walkway width.    Change in Gait Speed  Able to change speed, demonstrates mild gait deviations, deviates 6-10 in outside of the 12 in walkway width, or no gait deviations, unable to achieve a major change in velocity, or uses a change in velocity, or uses an assistive device.    Gait with Horizontal Head Turns  Performs head turns smoothly with no change in gait. Deviates no more than 6 in outside 12 in walkway width    Gait with Vertical Head Turns  Performs head turns with no change in gait. Deviates no more than 6 in outside 12 in walkway width.    Gait and Pivot Turn  Pivot turns safely within 3 sec and stops quickly with no loss of balance.    Step Over Obstacle  Is able to step over 2 stacked shoe boxes taped together (9 in  total height) without changing gait speed. No evidence of imbalance.    Gait with Narrow Base of Support  Is able to ambulate for 10 steps heel to toe with no staggering.    Gait with Eyes Closed  Walks 20 ft, no assistive devices, good speed, no evidence of imbalance, normal gait pattern, deviates no more than 6 in outside 12 in walkway width. Ambulates 20 ft in less than 7 sec.    Ambulating Backwards  Walks 20 ft, uses assistive device, slower speed, mild gait deviations, deviates 6-10 in outside 12 in walkway width.    Steps  Alternating feet, no rail.    Total Score  28    FGA comment:  Low Fall risk                   [facility] Adult PT Treatment/Exercise - 01/30/18 1430      Ambulation/Gait   Ambulation/Gait  Yes    Ambulation/Gait Assistance  6: Modified independent (Device/Increase time)    Ambulation/Gait Assistance Details  Pt able to ambulate over paved  and grassy as well as uphill/down hill grassy areas outdoors without LOB.     Ambulation Distance (Feet)  1500 Feet    Assistive device  None    Gait Pattern  Ataxic    Ambulation Surface  Level;Unlevel;Indoor;Outdoor;Paved;Grass    Gait velocity  3.71 ft/sec w/o AD    Stairs  Yes    Stairs Assistance  7: Independent    Stair Management Technique  No rails;Alternating pattern;Forwards    Number of Stairs  4    Height of Stairs  6      Self-Care   Self-Care  Other Self-Care Comments    Other Self-Care Comments   PT feels that from best simulation in clinic he is ready to return to work.  PT unsure if there will be any difficulty with more specific tasks as we are unable to fully simulate.  Discussed that PT will check remaining goals but leave episode of care open so that if pt does return to work and finds that certain tasks are difficult he can return and we can work on ways to make him more safe at work.  Pt verbalized agreement and understanding.        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  Pt able to climb ladder x 2 reps placing 20# weight to the R x 2 reps and to the L x 2 reps without LOB.  Pt did initially climb ladder in reciprocal pattern, however continue to note incoordination in LLE, therefore continued to recommend he perform leading with RLE and keep LLE mostly straight for safety.  Pt did all 4 reps in this manner.               PT Education - 01/31/18 0808    Education Details  see self care    Person(s) Educated  Patient    Methods  Explanation    Comprehension  Verbalized understanding       PT Short Term Goals - 01/15/18 1426      PT SHORT TERM GOAL #1   Title  Pt will initiate HEP in order to indicate improved balance and coordination. (Target Date: 01/17/18-updated to reflect 3 week delay in start)    Time  4    Period  Weeks    Status  Achieved       PT SHORT TERM GOAL #2   Title  Pt will improve BERG balance test to 51/56 in order to indicate decreased fall risk.     Baseline  54/56    Status  Achieved      PT SHORT TERM GOAL #3   Title  Pt will perform TUG <13.5 secs without AD in order to indicate decreased fall risk.     Baseline  9 seconds without cane    Status  Achieved      PT SHORT TERM GOAL #4   Title  Pt will ambulate over unlevel outdoor surfaces (paved and grass) with SPC at mod I level in order to indicate improved functional and community independence.     Baseline  MOD I paved, supervision compliant surfaces    Status  Partially Met      PT SHORT TERM GOAL #5   Title  Pt will improve gait speed to >/=2.62 ft/sec with LRAD in order to indicate improved community ambulation.     Baseline  3.4-3.5 ft/sec with and without cane    Status  Achieved        PT Long Term Goals - 01/30/18 1423      PT LONG TERM GOAL #1   Title  Pt will be independent with final HEP in order to indicate improved balance and coordination.  (Target Date: 02/22/18-updated to relfect 3 week delay in start)    Time  8    Period  Weeks    Status  Achieved      PT LONG TERM GOAL #2   Title  Pt will perform FGA as appropriate and improve score by 4 points.      Baseline  Initial score was 25/30 and increased to 28/30 indicating pt is upper end of low fall risk.      Status  Partially Met      PT LONG TERM GOAL #3   Title  Pt will improve gait speed to 3.8 ft/sec without AD in order to indicate decreased fall risk and improved efficiency of gait.     Baseline  3.4-3.5 with and without cane to 3.72 ft/sec without cane     Status  Partially Met      PT LONG TERM GOAL #4   Title  Pt will ambulate >1000' over varying outdoor surfaces without AD at independent level in order to indicate safe return to leisure and community activity.      Status  Achieved      PT LONG TERM GOAL #5   Title  Pt will be able to carry 20# for 20' demonstrating  safe body mechanics when lifting and placing to ground in order to indicate safe return to work.     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      Status  Achieved            Plan - 01/31/18 0808    Clinical Impression Statement  Skilled session continued to focus on safety with climbing ladder for best work simulation in clinic.  Pt did need cues to recall technique, however he reports he "just didn't want to keep leg locked".  Feel that we have simulated work duties as best we can, but won't really know if a more specific task will be difficult until he returns.  Discussed that PT will check goals today but leave episode of care open so that if upon return to work he has difficulty with more specific tasks he can return and we can address as needed.  Pt has met 4/6 LTGs, partially meeting remaining goals for gait speed and FGA.      PT Frequency  2x / week    PT Duration  4 weeks    PT Treatment/Interventions  ADLs/Self Care Home Management;Aquatic Therapy;Electrical Stimulation;DME Instruction;Gait training;Stair training;Functional mobility training;Therapeutic activities;Therapeutic exercise;Balance training;Neuromuscular re-education;Patient/family education;Vestibular    Consulted and Agree with Plan of Care  Patient       Patient will benefit from skilled therapeutic intervention in order to improve the following deficits and impairments:  Abnormal gait, Decreased activity tolerance, Decreased balance, Decreased coordination, Decreased endurance, Decreased mobility, Decreased knowledge of use of DME, Dizziness, Impaired perceived functional ability, Impaired vision/preception  Visit Diagnosis: Unsteadiness on feet  Other abnormalities of gait and mobility  Muscle weakness  (generalized)  Ataxia     Problem List Patient Active Problem List   Diagnosis Date Noted  . PFO (patent foramen ovale) 12/03/2017  . Cerebellar ataxia ([place]) 11/14/2017  . Cerebellar infarct ([place])   . Essential hypertension   . Cerebellar stroke ([place]) 11/12/2017  . Nausea and vomiting 11/12/2017  . Dizziness and giddiness 11/12/2017  . Hyperglycemia 11/12/2017  . Leukocytosis 11/12/2017  . Alcohol use 11/12/2017    Cameron Sprang, PT, MPT [facility] [address] [place], [place], [address] Phone: [phone]   Fax:  [phone] 01/31/18, 8:16 AM  Name: DEIDRICK RAINEY MRN: [id number] Date of Birth: 05-28-1965

## 2018-02-03 ENCOUNTER — Ambulatory Visit: Payer: BLUE CROSS/BLUE SHIELD | Admitting: Rehabilitation

## 2018-02-05 ENCOUNTER — Ambulatory Visit: Payer: BLUE CROSS/BLUE SHIELD | Admitting: Physical Therapy

## 2018-02-11 ENCOUNTER — Ambulatory Visit: Payer: BLUE CROSS/BLUE SHIELD | Admitting: Physical Therapy

## 2018-02-13 ENCOUNTER — Ambulatory Visit: Payer: BLUE CROSS/BLUE SHIELD | Admitting: Rehabilitation

## 2018-02-18 ENCOUNTER — Ambulatory Visit: Payer: BLUE CROSS/BLUE SHIELD | Admitting: Physical Therapy

## 2018-02-19 ENCOUNTER — Ambulatory Visit (HOSPITAL_BASED_OUTPATIENT_CLINIC_OR_DEPARTMENT_OTHER): Payer: BLUE CROSS/BLUE SHIELD | Admitting: Physical Medicine & Rehabilitation

## 2018-02-19 ENCOUNTER — Encounter: Payer: BLUE CROSS/BLUE SHIELD | Attending: Physical Medicine & Rehabilitation

## 2018-02-19 ENCOUNTER — Encounter: Payer: Self-pay | Admitting: Physical Medicine & Rehabilitation

## 2018-02-19 VITALS — BP 123/84 | HR 98 | Ht 70.0 in | Wt 232.0 lb

## 2018-02-19 DIAGNOSIS — I639 Cerebral infarction, unspecified: Secondary | ICD-10-CM

## 2018-02-19 DIAGNOSIS — Z8249 Family history of ischemic heart disease and other diseases of the circulatory system: Secondary | ICD-10-CM | POA: Diagnosis not present

## 2018-02-19 DIAGNOSIS — I69393 Ataxia following cerebral infarction: Secondary | ICD-10-CM | POA: Insufficient documentation

## 2018-02-19 DIAGNOSIS — R269 Unspecified abnormalities of gait and mobility: Secondary | ICD-10-CM | POA: Insufficient documentation

## 2018-02-19 DIAGNOSIS — Z7902 Long term (current) use of antithrombotics/antiplatelets: Secondary | ICD-10-CM | POA: Insufficient documentation

## 2018-02-19 DIAGNOSIS — I69398 Other sequelae of cerebral infarction: Secondary | ICD-10-CM | POA: Insufficient documentation

## 2018-02-19 DIAGNOSIS — Z7982 Long term (current) use of aspirin: Secondary | ICD-10-CM | POA: Diagnosis not present

## 2018-02-19 NOTE — Progress Notes (Signed)
Subjective:    Patient ID: John Mcbride, male    DOB: [birth year], 52 y.o.   MRN: [id number] Presented 11/12/2017 with nausea and vomiting  as well as dizziness and incoordination.  Cranial CT scan showed suggestion of asymmetrical low attenuation and possible mass effect in the left anterior cerebellum and cerebellar peduncle.  MRI showed a 3.5 cm diameter acute infarction in the superior  cerebellum on the left.  No evidence of mass effect or hemorrhage.  CT angiogram of head and neck with no large vessel occlusion  HPI  Practicing HEP since D/C from PT.   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         Status  Achieved       Pt will ambulate >1000' over varying outdoor surfaces without AD at independent level in order to indicate safe return to leisure and community activity.      Status  Achieved  Work Scientist, research (medical)imulation       Work Simulation  Continue to review safety with work simulation in clinic.  Pt able to climb ladder x 2 reps placing 20# weight to the R x 2 reps and to the L x 2 reps without LOB.  Pt did initially climb ladder in reciprocal pattern, however continue to note incoordination in LLE, therefore continued to recommend he perform leading with RLE and keep LLE mostly straight for safety.  Pt did all 4 reps in this manner.         Walks 20 ft in less than 5.5 sec, no assistive devices, good speed, no evidence for imbalance, normal gait pattern, deviates no more than 6 in outside of the 12 in walkway width.       Change in Gait Speed  Able to change speed, demonstrates mild gait deviations, deviates 6-10 in outside of the 12 in walkway width, or no gait deviations, unable to achieve a major change in velocity, or uses a change in  velocity, or uses an assistive device.     Gait with Horizontal Head Turns  Performs head turns smoothly with no change in gait. Deviates no more than 6 in outside 12 in walkway width     Gait with Vertical Head Turns  Performs head turns with no change in gait. Deviates no more than 6 in outside 12 in walkway width.     Gait and Pivot Turn  Pivot turns safely within 3 sec and stops quickly with no loss of balance.     Step Over Obstacle  Is able to step over 2 stacked shoe boxes taped together (9 in total height) without changing gait speed. No evidence of imbalance.     Gait with Narrow Base of Support  Is able to ambulate for 10 steps heel to toe with no staggering.     Gait with Eyes Closed  Walks 20 ft, no assistive devices, good speed, no evidence of imbalance, normal gait pattern, deviates no more than 6 in outside 12 in walkway width. Ambulates 20 ft in less than 7 sec.     Ambulating Backwards  Walks 20 ft, uses assistive device, slower speed, mild gait deviations, deviates 6-10 in outside 12 in walkway width.     Steps  Alternating feet, no rail.     Total Score  28     FGA comment:  Low Fall risk        Pain Inventory Average Pain 1 Pain Right Now 0 My pain is aching  In the last 24 hours, has pain interfered with the following? General activity 0 Relation with others 0 Enjoyment of life 0 What TIME of day is your pain at its worst? na Sleep (in general) Good  Pain is worse with: some activites Pain improves with: rest Relief from Meds: na  Mobility walk without assistance ability to climb steps?  yes do you drive?  yes  Function employed # of hrs/week 40  Neuro/Psych No problems in this area  Prior Studies Any changes since last visit?  no  Physicians involved in your care Any changes since last visit?  no   Family History  Problem Relation Age of Onset  . Cancer Mother   . Cancer Father   . Heart disease Father    Social History   Socioeconomic  History  . Marital status: Single    Spouse name: Not on file  . Number of children: Not on file  . Years of education: Not on file  . Highest education level: Not on file  Occupational History  . 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 Financial resource strain: Not hard at all  . Food insecurity:    Worry: Never true    Inability: Never true  . Transportation needs:    Medical: No    Non-medical: No  Tobacco Use  . Smoking status: Never Smoker  . Smokeless tobacco: Never Used  Substance and Sexual Activity  . Alcohol use: Yes    Alcohol/week: 10.0 standard drinks    Types: 10 Standard drinks or equivalent per week  . Drug use: No  . Sexual activity: Not on file  Lifestyle  . Physical activity:    Days per week: 0 days    Minutes per session: 0 min  . Stress: Not at all  Relationships  . Social connections:    Talks on phone: More than three times a week    Gets together: More than three times a week    Attends religious service: Patient refused    Active member of club or organization: Patient refused    Attends meetings of clubs or organizations: Patient refused    Relationship status: Patient refused  Other Topics Concern  . Not on file  Social History Narrative  . Not on file   Past Surgical History:  Procedure Laterality Date  . PATENT FORAMEN OVALE(PFO) CLOSURE N/A 12/04/2017   Procedure: PATENT FORAMEN OVALE (PFO) CLOSURE;  Surgeon: [name], MD;  Location: [facility] INVASIVE CV LAB;  Service: Cardiovascular;  Laterality: N/A;  . TEE WITHOUT CARDIOVERSION N/A 11/14/2017   Procedure: TRANSESOPHAGEAL ECHOCARDIOGRAM (TEE);  Surgeon: Pricilla Riffle, MD;  Location: [facility] ENDOSCOPY;  Service: Cardiovascular;  Laterality: N/A;  bubble study   Past Medical History:  Diagnosis Date  . Heart murmur   . Stroke ([facility])    Ht 5\' 10"  (1.778 m)   Wt 232 lb (105.2 kg)   BMI 33.29 kg/m   Opioid Risk Score:   Fall Risk Score:  `1  Depression  screen PHQ 2/9  Depression screen [facility] 2/9 11/12/2017 01/26/2015  Decreased Interest 0 0  Down, Depressed, Hopeless 0 0  PHQ - 2 Score 0 0  Review of Systems  Constitutional: Negative.   HENT: Negative.   Eyes: Negative.   Respiratory: Negative.   Cardiovascular: Negative.   Gastrointestinal: Negative.   Endocrine: Negative.   Genitourinary: Negative.   Musculoskeletal: Negative.   Skin: Negative.   Allergic/Immunologic: Negative.   Neurological: Negative.   Hematological: Negative.   Psychiatric/Behavioral: Negative.   All other systems reviewed and are negative.      Objective:   Physical Exam Motor strength is 5/5 bilateral deltoid, bicep, tricep, grip, hip flexor, knee extensor, ankle dorsiflexor Cervical spine normal range of motion Shoulders normal range of motion, elbow wrist and hands normal range of motion He has mild dysmetria left finger-nose-finger Ambulates without assistive device he has a stiff leg gait on the left side.  He is able to turn without loss of balance. He is able to get on hands and knees and get back up within a couple seconds. And affect are appropriate Cognition is intact Speech without dysarthria or aphasia.       Assessment & Plan:  1.  Left cerebellar infarct he is approximately 3 months post, he has made excellent recovery.  He has been able to work up to carrying objects up and down the ladder 20 to 30 pounds.  He has simulated work activities.  He appears to be ready go back to work with a 30 pound weight lifting restriction.  His job description indicates that 50 pounds as needed.  He feels that he can likely get somebody to help him with heavier objects. See the patient back in approximate 1 month to follow-up on his return to work.

## 2018-02-20 ENCOUNTER — Ambulatory Visit: Payer: BLUE CROSS/BLUE SHIELD | Admitting: Rehabilitation

## 2018-03-03 DIAGNOSIS — Z0000 Encounter for general adult medical examination without abnormal findings: Secondary | ICD-10-CM | POA: Diagnosis not present

## 2018-03-03 DIAGNOSIS — Z125 Encounter for screening for malignant neoplasm of prostate: Secondary | ICD-10-CM | POA: Diagnosis not present

## 2018-03-03 DIAGNOSIS — Z23 Encounter for immunization: Secondary | ICD-10-CM | POA: Diagnosis not present

## 2018-03-03 DIAGNOSIS — E7800 Pure hypercholesterolemia, unspecified: Secondary | ICD-10-CM | POA: Diagnosis not present

## 2018-03-03 DIAGNOSIS — R7309 Other abnormal glucose: Secondary | ICD-10-CM | POA: Diagnosis not present

## 2018-03-19 ENCOUNTER — Encounter: Payer: BLUE CROSS/BLUE SHIELD | Attending: Physical Medicine & Rehabilitation

## 2018-03-19 ENCOUNTER — Ambulatory Visit (HOSPITAL_BASED_OUTPATIENT_CLINIC_OR_DEPARTMENT_OTHER): Payer: BLUE CROSS/BLUE SHIELD | Admitting: Physical Medicine & Rehabilitation

## 2018-03-19 ENCOUNTER — Encounter: Payer: Self-pay | Admitting: Physical Medicine & Rehabilitation

## 2018-03-19 VITALS — BP 115/80 | HR 86 | Ht 70.0 in | Wt 233.8 lb

## 2018-03-19 DIAGNOSIS — I69398 Other sequelae of cerebral infarction: Secondary | ICD-10-CM | POA: Diagnosis not present

## 2018-03-19 DIAGNOSIS — Z8249 Family history of ischemic heart disease and other diseases of the circulatory system: Secondary | ICD-10-CM | POA: Diagnosis not present

## 2018-03-19 DIAGNOSIS — Z7982 Long term (current) use of aspirin: Secondary | ICD-10-CM | POA: Insufficient documentation

## 2018-03-19 DIAGNOSIS — R269 Unspecified abnormalities of gait and mobility: Secondary | ICD-10-CM | POA: Diagnosis not present

## 2018-03-19 DIAGNOSIS — G119 Hereditary ataxia, unspecified: Secondary | ICD-10-CM | POA: Diagnosis not present

## 2018-03-19 DIAGNOSIS — Z7902 Long term (current) use of antithrombotics/antiplatelets: Secondary | ICD-10-CM | POA: Insufficient documentation

## 2018-03-19 DIAGNOSIS — I639 Cerebral infarction, unspecified: Secondary | ICD-10-CM | POA: Diagnosis not present

## 2018-03-19 DIAGNOSIS — I69393 Ataxia following cerebral infarction: Secondary | ICD-10-CM | POA: Diagnosis not present

## 2018-03-19 NOTE — Patient Instructions (Signed)
Please call if you need a letter for work.  Please indicate what info your employer requires

## 2018-03-19 NOTE — Progress Notes (Signed)
Subjective:    Patient ID: John Mcbride, male    DOB: 1965/10/15, 52 y.o.   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  Endurance was an issue initially but better.  Pushes a cart, occ carries objects up to 15lb No falls or injuries at work Has not done vertical stands Keeping up on work load  Pain Inventory Average Pain 0 Pain Right Now 0 My pain is na  In the last 24 hours, has pain interfered with the following? General activity 1 Relation with others 1 Enjoyment of life 1 What TIME of day is your pain at its worst? daytime Sleep (in general) Fair  Pain is worse with: walking Pain improves with: rest Relief from Meds: na  Mobility walk without assistance ability to climb steps?  yes do you drive?  yes  Function employed # of hrs/week 40  Neuro/Psych No problems in this area  Prior Studies Any changes since last visit?  no  Physicians involved in your care Any changes since last visit?  no   Family History  Problem Relation Age of Onset  . Cancer Mother   . Cancer Father   . Heart disease Father    Social History   Socioeconomic History  . Marital status: Single    Spouse name: Not on file  . Number of children: Not on file  . Years of education: Not on file  . Highest education level: Not on file  Occupational History  . Occupation: Psychologist, counsellingairplane engineer    Employer: [employer]  Social Needs  . Financial resource strain: Not hard at all  . Food insecurity:    Worry: Never true    Inability: Never true  . Transportation needs:    Medical: No    Non-medical: No  Tobacco Use  . Smoking status: Never Smoker  . Smokeless tobacco: Never Used  Substance and Sexual Activity  . Alcohol use: Yes    Alcohol/week: 10.0 standard drinks    Types: 10 Standard drinks or equivalent per week  . Drug use: No  . Sexual activity: Not on file  Lifestyle  .  Physical activity:    Days per week: 0 days    Minutes per session: 0 min  . Stress: Not at all  Relationships  . Social connections:    Talks on phone: More than three times a week    Gets together: More than three times a week    Attends religious service: Patient refused    Active member of club or organization: Patient refused    Attends meetings of clubs or organizations: Patient refused    Relationship status: Patient refused  Other Topics Concern  . Not on file  Social History Narrative  . Not on file   Past Surgical History:  Procedure Laterality Date  . PATENT FORAMEN OVALE(PFO) CLOSURE N/A 12/04/2017   Procedure: PATENT FORAMEN OVALE (PFO) CLOSURE;  Surgeon: Tonny Bollmanooper, Michael, MD;  [facility];  Service: Cardiovascular;  Laterality: N/A;  . TEE WITHOUT CARDIOVERSION N/A 11/14/2017   Procedure: TRANSESOPHAGEAL ECHOCARDIOGRAM (TEE);  Surgeon: Pricilla Riffleoss, Paula V, MD;  [facility];  Service: Cardiovascular;  Laterality: N/A;  bubble study   Past Medical History:  Diagnosis Date  . Heart murmur   . Stroke ([facility])    Ht 5\' 10"  (1.778 m)   Wt 233 lb 12.8 oz (  106.1 kg)   BMI 33.55 kg/m   Opioid Risk Score:   Fall Risk Score:  `1  Depression screen PHQ 2/9  [facility] 11/12/2017 01/26/2015  Decreased Interest 0 0  Down, Depressed, Hopeless 0 0  PHQ - 2 Score 0 0     Review of Systems  Constitutional: Negative.   HENT: Negative.   Eyes: Negative.   Respiratory: Positive for cough.   Cardiovascular: Negative.   Gastrointestinal: Negative.   Endocrine: Negative.   Genitourinary: Negative.   Musculoskeletal: Negative.   Skin: Negative.   Allergic/Immunologic: Negative.   Neurological: Negative.   Hematological: Negative.   Psychiatric/Behavioral: Negative.   All other systems reviewed and are negative.      Objective:   Physical Exam Vitals signs and nursing note reviewed.  Constitutional:      Appearance: Normal  appearance.  HENT:     Head: Normocephalic and atraumatic.     Nose: Nose normal.     Mouth/Throat:     Mouth: Mucous membranes are moist.  Neurological:     Mental Status: He is alert.   mild deficit with tandem Gait is stiff legged on the left it but no imbalance mildly increased base of support Mild dysmetria left finger-nose-finger as well as left heel shin Strength is 5/5 bilateral deltoid, bicep, tricep, grip, hip flexor, knee extensor, ankle dorsiflexion plantarflexion Extremities without edema      Assessment & Plan:  1.  History of cerebellar infarct mild residual left hemiataxia as well as mild gait disorder. Is functioning well and is prior job.  He is working full-time.  He has a 30 pound lifting restriction.  His regular job description has 50 pound lifting.  Patient feels like he is doing most of his usual job duties at the current time.  He will continue with his home exercise program. We discussed trying to practice tandem gait as well. He will follow-up with me on a as needed basis.  He can call if he needs a new letter but if it is more extensive than that it may require another visit With PCP for secondary stroke prevention as well as neurology

## 2018-04-09 ENCOUNTER — Ambulatory Visit (INDEPENDENT_AMBULATORY_CARE_PROVIDER_SITE_OTHER): Payer: BLUE CROSS/BLUE SHIELD | Admitting: Adult Health

## 2018-04-09 ENCOUNTER — Encounter: Payer: Self-pay | Admitting: Adult Health

## 2018-04-09 VITALS — BP 118/79 | HR 85 | Ht 70.0 in | Wt 235.0 lb

## 2018-04-09 DIAGNOSIS — Q2112 Patent foramen ovale: Secondary | ICD-10-CM

## 2018-04-09 DIAGNOSIS — Q211 Atrial septal defect: Secondary | ICD-10-CM

## 2018-04-09 DIAGNOSIS — E785 Hyperlipidemia, unspecified: Secondary | ICD-10-CM

## 2018-04-09 DIAGNOSIS — I639 Cerebral infarction, unspecified: Secondary | ICD-10-CM

## 2018-04-09 DIAGNOSIS — I10 Essential (primary) hypertension: Secondary | ICD-10-CM

## 2018-04-09 NOTE — Progress Notes (Signed)
[facility] [address] [place]. [place] [id number] [phone]       OFFICE FOLLOW UP NOTE  [name] Date of Birth:  08-06-1965 Medical Record Number:  [id number]   Reason for Referral:  hospital stroke follow up  CHIEF COMPLAINT:  Chief Complaint  Patient presents with  . Follow-up    Stroke follow up room in back hallway pt alone, Pt has no DME equipment for ambulation    HPI: John Mcbride is being seen today for initial visit in the office for left SCA territory infarct related to moderate size PFO on 11/12/2017. History obtained from patient has and chart review. Reviewed all radiology images and labs personally.  John CampbellDavid Mcbride is a 53 year old male with history of alcohol use and not seeing doctor for years who presented with dizziness, difficulty walking, and nausea vomiting.  CT head reviewed and showed left cerebellar infarcts.  MRI brain reviewed and confirmed left SCA territory infarct.  CT head and neck showed left SCA occlusion.  Repeat CT stable, no hydrocephalus.    2D echo showed an EF 60 to 65%.  LDL 85 and A1c 5.0, UDS negative.  LE venous Doppler negative for DVT.  TEE recommended due to concerns of stroke being embolic pattern.  TEE showed moderate PFO. hypercoagulable work-up  negative.  LDL 85 and recommended Lipitor 20 mg daily.  A1c satisfactory at 5.0.  No antithrombotic prior to admission and recommended DAPT for 3 weeks and aspirin alone.  Due to patient's age, and lack of significant stroke risk factors, it was determined that his stroke was most likely related to PFO and recommended for patient to follow with Dr. Excell Seltzerooper for PFO closure consideration.  [facility].  01/06/2018 visit: Patient is being seen today for hospital follow-up.  He did undergo PFO closure on 12/04/2017 in which he tolerated well without complications.  It was recommended for patient to undergo DAPT for 3 months post PFO  closure.  Patient continues to take both aspirin and Plavix without side effects of bleeding or bruising.  He continues to take Lipitor without side effects of myalgias.  He continues to have gait difficulties along with decreased left hand dexterity.  He continues to participate in PT/OT at our neuro rehab clinic next-door.  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  Blood pressure today satisfactory 129/88.  He does use a cane for ambulation along with a left knee brace is at times his knee will "buckle".  No further concerns at this time and denies new or worsening stroke/TIA symptoms.  Interval history 04/09/2018: Patient is being seen today for 8028-month follow-up. He states overall his walking has improved along with his fatigue level.  He does have intermittent difficulties with left hand dexterity.  He has returned to work on 02/25/18. He has been doing well without recent difficulties.  He continues on aspirin and plavix despite recommendations by cardiology to d/c after 03/05/18. Denies side effects of bleeding or bruising.  Continues on atorvastatin without side effects myalgias.  Blood pressure today 118/79.  He continues to do well from a stroke standpoint.  No further concerns at this time and all questions answered during visit.  Denies new or worsening stroke/TIA symptoms.   ROS:   14 system review of systems performed and negative with exception of fatigue and numbness  PMH:  Past Medical History:  Diagnosis Date  . Heart murmur   . Stroke [facility](HCC)     PSH:  Past Surgical History:  Procedure Laterality Date  . PATENT FORAMEN OVALE(PFO) CLOSURE N/A 12/04/2017   Procedure: PATENT FORAMEN OVALE (PFO) CLOSURE;  Surgeon: Tonny Bollmanooper, Michael, MD;  [facility];  Service: Cardiovascular;  Laterality: N/A;  . TEE WITHOUT CARDIOVERSION N/A 11/14/2017   Procedure: TRANSESOPHAGEAL ECHOCARDIOGRAM (TEE);  Surgeon:  Pricilla Riffleoss, Paula V, MD;  [facility];  Service: Cardiovascular;  Laterality: N/A;  bubble study    Social History:  Social History   Socioeconomic History  . Marital status: Single    Spouse name: Not on file  . Number of children: Not on file  . Years of education: Not on file  . Highest education level: Not on file  Occupational History  . Occupation: Psychologist, counsellingairplane engineer    Employer: [employer]  Social Needs  . Financial resource strain: Not hard at all  . Food insecurity:    Worry: Never true    Inability: Never true  . Transportation needs:    Medical: No    Non-medical: No  Tobacco Use  . Smoking status: Never Smoker  . Smokeless tobacco: Never Used  Substance and Sexual Activity  . Alcohol use: Yes    Alcohol/week: 10.0 standard drinks    Types: 10 Standard drinks or equivalent per week    Comment: 2 beers weekly on a average  . Drug use: No  . Sexual activity: Not on file  Lifestyle  . Physical activity:    Days per week: 0 days    Minutes per session: 0 min  . Stress: Not at all  Relationships  . Social connections:    Talks on phone: More than three times a week    Gets together: More than three times a week    Attends religious service: Patient refused    Active member of club or organization: Patient refused    Attends meetings of clubs or organizations: Patient refused    Relationship status: Patient refused  . Intimate partner violence:    Fear of current or ex partner: Patient refused    Emotionally abused: Patient refused    Physically abused: Patient refused    Forced sexual activity: Patient refused  Other Topics Concern  . Not on file  Social History Narrative  . Not on file    Family History:  Family History  Problem Relation Age of Onset  . Cancer Mother   . Cancer Father   . Heart disease Father     Medications:   Current Outpatient Medications on File Prior to Visit  Medication Sig Dispense Refill  . aspirin EC 81 MG tablet Take  81 mg by mouth daily.    Marland Kitchen. atorvastatin (LIPITOR) 20 MG tablet Take 1 tablet (20 mg total) by mouth daily at 6 PM. 30 tablet 0   No current facility-administered medications on file prior to visit.     Allergies:  No Known Allergies   Physical Exam  Vitals:   04/09/18 1513  BP: 118/79  Pulse: 85  Weight: 235 lb (106.6 kg)  Height: 5\' 10"  (1.778 m)   Body mass index is 33.72 kg/m. No exam data present  General: well developed, well nourished, pleasant middle-aged Caucasian male, seated, in no evident distress Head: head normocephalic and atraumatic.   Neck: supple with no carotid or supraclavicular bruits Cardiovascular: regular rate and rhythm, no murmurs Musculoskeletal: no deformity  Skin:  no rash/petichiae Vascular:  Normal pulses all extremities  Neurologic Exam Mental Status: Awake and fully alert. Oriented to place and time. Recent and remote memory intact. Attention span, concentration and fund of knowledge appropriate. Mood and affect appropriate.  Cranial Nerves: Fundoscopic exam reveals sharp disc margins. Pupils equal, briskly reactive to light. Extraocular movements full without nystagmus. Visual fields full to confrontation. Hearing intact. Facial sensation intact. Face, tongue, palate moves normally and symmetrically.  Motor: Normal bulk and tone. Normal strength in all tested extremity muscles. Sensory.: intact to touch , pinprick , position and vibratory sensation.  Coordination: Rapid alternating movements normal in all extremities. Finger-to-nose and heel-to-shin performed accurately bilaterally.  Decreased left hand dexterity. Gait and Station: Arises from chair without difficulty. Stance is normal. Gait demonstrates normal steps and balance without need of assistive device.  Able to heel, toe and tandem walk with mild difficulty.   Reflexes: 1+ and symmetric. Toes downgoing.        Diagnostic Data (Labs, Imaging, Testing)  Ct Head Wo  Contrast 11/12/2017  Suggestion of asymmetrical low-attenuation and possible mass effect in the left anterior cerebellum and cerebellar peduncle. MRI is recommended for further evaluation. No acute intracranial hemorrhage or mass effect.   Ct Angio Head W Or Wo Contrast Ct Angio Neck W And/or Wo Contrast 11/12/2017 The patient does not show evidence of atherosclerotic vascular disease in general or of dissection. 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   Mr Brain Wo Contrast 11/12/2017 3.5 cm in diameter acute infarction in the superior cerebellum on the left. No evidence of significant swelling/mass effect or of any hemorrhage. Mild chronic small-vessel ischemic change of the cerebral hemispheric white matter. No large vessel vascular occlusion identified on this noncontrast study.   Ct Head Wo Contrast 11/12/2017  1. Evolving LEFT cerebellar/SCA territory nonhemorrhagic infarct. Regional mass effect without hydrocephalus. 2. Borderline parenchymal brain volume loss for age. 3. Mild chronic small vessel ischemic changes.   2D Echocardiogram  - Left ventricle: The cavity size was normal. Systolic function wasnormal. The estimated ejection fraction was in the range of 60%to 65%. Wall motion was normal; there were no regional wallmotion abnormalities. Left ventricular diastolic functionparameters were normal. - Mitral valve: There was trivial regurgitation. Valve area bypressure half-time: 1.43 cm^2. - Tricuspid valve: There was trivial regurgitation.  LE doppler There is no DVT or SVT noted in the bilateral lower extremities   ASSESSMENT: John Mcbride is a 54 y.o. year old male here with left SCA territory infarct on 11/12/17 secondary to PFO who underwent closure on 12/04/17. Vascular risk factors include PFO with closure, HTN and HLD.  Patient is being seen today for follow-up visit and overall continues to do well with only mild  balance difficulties intermittently and intermittent decreased left hand dexterity.    PLAN: -Continue aspirin 81 mg daily  and Lipitor for secondary stroke prevention -Advised patient to discontinue Plavix at this time as it was recommended by cardiology to continue DAPT until 03/05/2018 and then continue on aspirin alone -F/u with PCP regarding your HLD and HTN management -Provided patient with fine motor control exercises to assist with left hand decreased dexterity -continue to monitor BP at home -advised to continue to stay active and maintain a healthy diet -Maintain strict control of hypertension with blood pressure goal below 130/90, diabetes with hemoglobin A1c goal below 6.5% and cholesterol with LDL cholesterol (bad cholesterol) goal below 70 mg/dL. I  also advised the patient to eat a healthy diet with plenty of whole grains, cereals, fruits and vegetables, exercise regularly and maintain ideal body weight.  Follow up in 6 months or call earlier if needed   Greater than 50% of time during this 25 minute visit was spent on counseling,explanation of diagnosis of left SCA territory infarct, reviewing risk factor management of PFO, HLD and HTN, planning of further management, discussion with patient and family and coordination of care    George Hugh, AGNP-BC  [facility] [address] [place], [place] [address]  Phone [phone] Fax [phone] Note: This document was prepared with digital dictation and possible smart phrase technology. Any transcriptional errors that result from this process are unintentional.

## 2018-04-09 NOTE — Patient Instructions (Signed)
Continue aspirin 81 mg daily  and lipitor  for secondary stroke prevention  Please stop plavix at this time and contiue on aspirin only  Continue to follow up with PCP regarding cholesterol and blood pressure management   Continue to stay active and maintain a healthy diet  Continue to monitor blood pressure at home  Maintain strict control of hypertension with blood pressure goal below 130/90, diabetes with hemoglobin A1c goal below 6.5% and cholesterol with LDL cholesterol (bad cholesterol) goal below 70 mg/dL. I also advised the patient to eat a healthy diet with plenty of whole grains, cereals, fruits and vegetables, exercise regularly and maintain ideal body weight.  Followup in the future with me in 6 months or call earlier if needed       Thank you for coming to see [place] at [facility]. I hope we have been able to provide you high quality care today.  You may receive a patient satisfaction survey over the next few weeks. We would appreciate your feedback and comments so that we may continue to improve ourselves and the health of our patients.

## 2018-04-17 NOTE — Progress Notes (Signed)
I agree with the above plan 

## 2018-05-29 ENCOUNTER — Encounter: Payer: Self-pay | Admitting: Rehabilitation

## 2018-05-29 NOTE — Therapy (Signed)
[facility] [address] [place], [place], [address] Phone: [phone]   Fax:  [phone]  Patient Details  Name: John Mcbride MRN: [id number] Date of Birth: Apr 18, 1965 Referring Provider:  No ref. provider found  Encounter Date: 05/29/2018  PHYSICAL THERAPY DISCHARGE SUMMARY  Visits from Start of Care: 10  Current functional level related to goals / functional outcomes: Pt's LTGs were checked, however encounter left open in the case that he returned to work and was finding some work duties difficult.  Pt has not returned, therefore will formally D/C.    Remaining deficits: PT Long Term Goals - 01/30/18 1423      PT LONG TERM GOAL #1   Title  Pt will be independent with final HEP in order to indicate improved balance and coordination.  (Target Date: 02/22/18-updated to relfect 3 week delay in start)    Time  8    Period  Weeks    Status  Achieved      PT LONG TERM GOAL #2   Title  Pt will perform FGA as appropriate and improve score by 4 points.      Baseline  Initial score was 25/30 and increased to 28/30 indicating pt is upper end of low fall risk.      Status  Partially Met      PT LONG TERM GOAL #3   Title  Pt will improve gait speed to 3.8 ft/sec without AD in order to indicate decreased fall risk and improved efficiency of gait.     Baseline  3.4-3.5 with and without cane to 3.72 ft/sec without cane     Status  Partially Met      PT LONG TERM GOAL #4   Title  Pt will ambulate >1000' over varying outdoor surfaces without AD at independent level in order to indicate safe return to leisure and community activity.      Status  Achieved      PT LONG TERM GOAL #5   Title  Pt will be able to carry 20# for 20' demonstrating safe body mechanics when lifting and placing to ground in order to indicate safe return to work.     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      Status  Achieved         Education / Equipment: HEP   Plan: Patient agrees to discharge.  Patient goals were not met. Patient is being discharged due to meeting the stated rehab goals.  ?????     Cameron Sprang, PT, MPT [facility] [address] [place], [place], [address] Phone: [phone]   Fax:  [phone] 05/29/18, 10:52 AM

## 2018-06-16 ENCOUNTER — Telehealth: Payer: Self-pay | Admitting: Physician Assistant

## 2018-06-16 NOTE — Telephone Encounter (Signed)
  [facility]   MULTIDISCIPLINARY HEART VALVE TEAM  Patient was scheduled for routine 6 month PFO follow up 06/17/18. I have called the patient and he is not having any active issues at this time. Due to the Covid-19 pandemic we have cancelled this appointment to minimize the risk of exposure.   Cline Crock PA-C  [facility]

## 2018-06-17 ENCOUNTER — Ambulatory Visit: Payer: BLUE CROSS/BLUE SHIELD | Admitting: Physician Assistant

## 2018-07-03 ENCOUNTER — Telehealth: Payer: Self-pay | Admitting: *Deleted

## 2018-07-03 NOTE — Telephone Encounter (Signed)
Left message for patient to call me to do PFO visit over the phone due to covid-19. Left message on 07-01-18 and 07-03-18

## 2018-09-07 DIAGNOSIS — R7309 Other abnormal glucose: Secondary | ICD-10-CM | POA: Diagnosis not present

## 2018-09-07 DIAGNOSIS — E7800 Pure hypercholesterolemia, unspecified: Secondary | ICD-10-CM | POA: Diagnosis not present

## 2018-09-07 DIAGNOSIS — I639 Cerebral infarction, unspecified: Secondary | ICD-10-CM | POA: Diagnosis not present

## 2018-09-29 ENCOUNTER — Telehealth: Payer: Self-pay | Admitting: Adult Health

## 2018-09-29 NOTE — Telephone Encounter (Signed)
I called patient regarding rescheduling his 7/10 appointment with Janett Billow NP due to our office being closed. Requested patient call back to reschedule.

## 2018-10-09 ENCOUNTER — Ambulatory Visit: Payer: BLUE CROSS/BLUE SHIELD | Admitting: Adult Health

## 2018-11-03 ENCOUNTER — Other Ambulatory Visit: Payer: Self-pay

## 2018-11-03 ENCOUNTER — Ambulatory Visit (INDEPENDENT_AMBULATORY_CARE_PROVIDER_SITE_OTHER): Payer: BC Managed Care – PPO | Admitting: Adult Health

## 2018-11-03 ENCOUNTER — Encounter: Payer: Self-pay | Admitting: Adult Health

## 2018-11-03 VITALS — BP 115/71 | HR 84 | Temp 98.2°F | Ht 70.0 in | Wt 216.2 lb

## 2018-11-03 DIAGNOSIS — Q211 Atrial septal defect: Secondary | ICD-10-CM | POA: Diagnosis not present

## 2018-11-03 DIAGNOSIS — I10 Essential (primary) hypertension: Secondary | ICD-10-CM | POA: Diagnosis not present

## 2018-11-03 DIAGNOSIS — I639 Cerebral infarction, unspecified: Secondary | ICD-10-CM | POA: Diagnosis not present

## 2018-11-03 DIAGNOSIS — E785 Hyperlipidemia, unspecified: Secondary | ICD-10-CM

## 2018-11-03 DIAGNOSIS — Q2112 Patent foramen ovale: Secondary | ICD-10-CM

## 2018-11-03 NOTE — Progress Notes (Signed)
[facility] [address] [place]. [place] [phone]       OFFICE FOLLOW UP NOTE  Mr. John SidleDavid G Capshaw Date of Birth:  21-Jul-1965 Medical Record Number:  [id number]   Reason for Referral:  hospital stroke follow up  CHIEF COMPLAINT:  Chief Complaint  Patient presents with   Follow-up    Treatment room, alone. Office visit Cerebellar infarct.     HPI:  Stroke admission 11/12/2017: Richmond CampbellDavid Casali is a 53 year old male with history of alcohol use and not seeing doctor for years who presented with dizziness, difficulty walking, and nausea vomiting.  CT head reviewed and showed left cerebellar infarcts.  MRI brain reviewed and confirmed left SCA territory infarct.  CT head and neck showed left SCA occlusion.  Repeat CT stable, no hydrocephalus.    2D echo showed an EF 60 to 65%.  LDL 85 and A1c 5.0, UDS negative.  LE venous Doppler negative for DVT.  TEE recommended due to concerns of stroke being embolic pattern.  TEE showed moderate PFO. hypercoagulable work-up  negative.  LDL 85 and recommended Lipitor 20 mg daily.  A1c satisfactory at 5.0.  No antithrombotic prior to admission and recommended DAPT for 3 weeks and aspirin alone.  Due to patient's age, and lack of significant stroke risk factors, it was determined that his stroke was most likely related to PFO and recommended for patient to follow with Dr. Excell Seltzerooper for PFO closure consideration.  [facility].  01/06/2018 visit: Patient is being seen today for hospital follow-up.  He did undergo PFO closure on 12/04/2017 in which he tolerated well without complications.  It was recommended for patient to undergo DAPT for 3 months post PFO closure.  Patient continues to take both aspirin and Plavix without side effects of bleeding or bruising.  He continues to take Lipitor without side effects of myalgias.  He continues to have gait difficulties along with decreased left hand  dexterity.  He continues to participate in PT/OT at our neuro rehab clinic next-door.  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  Blood pressure today satisfactory 129/88.  He does use a cane for ambulation along with a left knee brace is at times his knee will "buckle".  No further concerns at this time and denies new or worsening stroke/TIA symptoms.  history 04/09/2018: Patient is being seen today for 1956-month follow-up. He states overall his walking has improved along with his fatigue level.  He does have intermittent difficulties with left hand dexterity.  He has returned to work on 02/25/18. He has been doing well without recent difficulties.  He continues on aspirin and plavix despite recommendations by cardiology to d/c after 03/05/18. Denies side effects of bleeding or bruising.  Continues on atorvastatin without side effects myalgias.  Blood pressure today 118/79.  He continues to do well from a stroke standpoint.  No further concerns at this time and all questions answered during visit.  Denies new or worsening stroke/TIA symptoms.  Interval history 11/03/18: Mr. Harlin HeysRollins is a 53 year old male who is being seen today for stroke follow-up.  He has been stable from a stroke standpoint with residual mild left hand decreased dexterity.  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  Continues on aspirin 81 mg and atorvastatin without reported side effects for secondary stroke prevention.  Blood pressure 115/71.  He continues  to stay active with regular exercise and activity.  Denies new or worsening stroke/TIA symptoms.  No further concerns at this time.    ROS:   14 system review of systems performed and negative with exception of weakness  PMH:  Past Medical History:  Diagnosis Date   Heart murmur    Stroke (HCC)     PSH:  Past Surgical History:  Procedure Laterality Date   PATENT FORAMEN  OVALE(PFO) CLOSURE N/A 12/04/2017   Procedure: PATENT FORAMEN OVALE (PFO) CLOSURE;  Surgeon: Tonny Bollmanooper, Michael, MD;  [facility];  Service: Cardiovascular;  Laterality: N/A;   TEE WITHOUT CARDIOVERSION N/A 11/14/2017   Procedure: TRANSESOPHAGEAL ECHOCARDIOGRAM (TEE);  Surgeon: Pricilla Riffleoss, Paula V, MD;  [facility];  Service: Cardiovascular;  Laterality: N/A;  bubble study    Social History:  Social History   Socioeconomic History   Marital status: Single    Spouse name: Not on file   Number of children: Not on file   Years of education: Not on file   Highest education level: Not on file  Occupational History   Occupation: Psychologist, counsellingairplane engineer    Employer: [employer]  Social Needs   Financial resource strain: Not hard at all   Food insecurity    Worry: Never true    Inability: Never true   Transportation needs    Medical: No    Non-medical: No  Tobacco Use   Smoking status: Never Smoker   Smokeless tobacco: Never Used  Substance and Sexual Activity   Alcohol use: Yes    Alcohol/week: 10.0 standard drinks    Types: 10 Standard drinks or equivalent per week    Comment: 2 beers weekly on a average   Drug use: No   Sexual activity: Not on file  Lifestyle   Physical activity    Days per week: 0 days    Minutes per session: 0 min   Stress: Not at all  Relationships   Social connections    Talks on phone: More than three times a week    Gets together: More than three times a week    Attends religious service: Patient refused    Active member of club or organization: Patient refused    Attends meetings of clubs or organizations: Patient refused    Relationship status: Patient refused   Intimate partner violence    Fear of current or ex partner: Patient refused    Emotionally abused: Patient refused    Physically abused: Patient refused    Forced sexual activity: Patient refused  Other Topics Concern   Not on file  Social History Narrative    Not on file    Family History:  Family History  Problem Relation Age of Onset   Cancer Mother    Cancer Father    Heart disease Father     Medications:   Current Outpatient Medications on File Prior to Visit  Medication Sig Dispense Refill   aspirin EC 81 MG tablet Take 81 mg by mouth daily.     clopidogrel (PLAVIX) 75 MG tablet      atorvastatin (LIPITOR) 20 MG tablet Take 1 tablet (20 mg total) by mouth daily at 6 PM. 30 tablet 0   No current facility-administered medications on file prior to visit.     Allergies:  No Known Allergies   Physical Exam  Vitals:   11/03/18 1127  BP: 115/71  Pulse: 84  Temp: 98.2 F (36.8 C)  Weight: 216 lb 3.2  oz (98.1 kg)  Height: 5\' 10"  (1.778 m)   Body mass index is 31.02 kg/m. No exam data present  General: well developed, well nourished, pleasant middle-aged Caucasian male, seated, in no evident distress Head: head normocephalic and atraumatic.   Neck: supple with no carotid or supraclavicular bruits Cardiovascular: regular rate and rhythm, no murmurs Musculoskeletal: no deformity Skin:  no rash/petichiae Vascular:  Normal pulses all extremities  Neurologic Exam Mental Status: Awake and fully alert. Oriented to place and time. Recent and remote memory intact. Attention span, concentration and fund of knowledge appropriate. Mood and affect appropriate.  Cranial Nerves: Pupils equal, briskly reactive to light. Extraocular movements full without nystagmus. Visual fields full to confrontation. Hearing intact. Facial sensation intact. Face, tongue, palate moves normally and symmetrically.  Motor: Normal bulk and tone. Normal strength in all tested extremity muscles. Sensory.: intact to touch , pinprick , position and vibratory sensation.  Coordination: Rapid alternating movements normal in all extremities. Finger-to-nose and heel-to-shin performed accurately bilaterally.  Decreased left hand dexterity. Gait and Station: Arises  from chair without difficulty. Stance is normal. Gait demonstrates normal steps and balance without need of assistive device.  Able to heel, toe and tandem walk with mild difficulty.   Reflexes: 1+ and symmetric. Toes downgoing.        Diagnostic Data (Labs, Imaging, Testing)  Ct Head Wo Contrast 11/12/2017  Suggestion of asymmetrical low-attenuation and possible mass effect in the left anterior cerebellum and cerebellar peduncle. MRI is recommended for further evaluation. No acute intracranial hemorrhage or mass effect.   Ct Angio Head W Or Wo Contrast Ct Angio Neck W And/or Wo Contrast 11/12/2017 The patient does not show evidence of atherosclerotic vascular disease in general or of dissection. 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   Mr Brain Wo Contrast 11/12/2017 3.5 cm in diameter acute infarction in the superior cerebellum on the left. No evidence of significant swelling/mass effect or of any hemorrhage. Mild chronic small-vessel ischemic change of the cerebral hemispheric white matter. No large vessel vascular occlusion identified on this noncontrast study.   Ct Head Wo Contrast 11/12/2017  1. Evolving LEFT cerebellar/SCA territory nonhemorrhagic infarct. Regional mass effect without hydrocephalus. 2. Borderline parenchymal brain volume loss for age. 3. Mild chronic small vessel ischemic changes.   2D Echocardiogram  - Left ventricle: The cavity size was normal. Systolic function wasnormal. The estimated ejection fraction was in the range of 60%to 65%. Wall motion was normal; there were no regional wallmotion abnormalities. Left ventricular diastolic functionparameters were normal. - Mitral valve: There was trivial regurgitation. Valve area bypressure half-time: 1.43 cm^2. - Tricuspid valve: There was trivial regurgitation.  LE doppler There is no DVT or SVT noted in the bilateral lower extremities   ASSESSMENT:  John Mcbride is a 53 y.o. year old male here with left SCA territory infarct on 11/12/17 secondary to PFO who underwent closure on 12/04/17. Vascular risk factors include PFO with closure, HTN and HLD.  Residual decrease left hand dexterity but overall recovered well and has returned back to all prior activities without difficulties.    PLAN: -Continue aspirin 81 mg daily  and Lipitor for secondary stroke prevention -ongoing refills by PCP -F/u with PCP regarding your HLD and HTN management -continue to monitor BP at home -advised to continue to stay active and maintain a healthy diet -Maintain strict control of hypertension with blood pressure goal below 130/90, diabetes with hemoglobin A1c goal below  6.5% and cholesterol with LDL cholesterol (bad cholesterol) goal below 70 mg/dL. I also advised the patient to eat a healthy diet with plenty of whole grains, cereals, fruits and vegetables, exercise regularly and maintain ideal body weight.  Recovered well from a stroke standpoint recommend follow-up as needed   Greater than 50% of time during this 25 minute visit was spent on counseling,explanation of diagnosis of left SCA territory infarct, reviewing risk factor management of PFO, HLD and HTN, planning of further management, discussion with patient and family and coordination of care    Venancio Poisson, AGNP-BC  [facility] [address] [place], [place] [address]  Phone [phone] Fax [phone] Note: This document was prepared with digital dictation and possible smart phrase technology. Any transcriptional errors that result from this process are unintentional.

## 2018-11-03 NOTE — Patient Instructions (Signed)
Continue aspirin 81 mg daily  and Lipitor for secondary stroke prevention  Continue to follow up with PCP regarding cholesterol and blood pressure management   Continue to monitor blood pressure at home  Maintain strict control of hypertension with blood pressure goal below 130/90, diabetes with hemoglobin A1c goal below 6.5% and cholesterol with LDL cholesterol (bad cholesterol) goal below 70 mg/dL. I also advised the patient to eat a healthy diet with plenty of whole grains, cereals, fruits and vegetables, exercise regularly and maintain ideal body weight.  Follow-up as needed       Thank you for coming to see [place] at [facility]. I hope we have been able to provide you high quality care today.  You may receive a patient satisfaction survey over the next few weeks. We would appreciate your feedback and comments so that we may continue to improve ourselves and the health of our patients.

## 2018-11-09 NOTE — Progress Notes (Signed)
I agree with the above plan 

## 2018-11-30 ENCOUNTER — Other Ambulatory Visit: Payer: Self-pay

## 2018-11-30 DIAGNOSIS — Z8774 Personal history of (corrected) congenital malformations of heart and circulatory system: Secondary | ICD-10-CM

## 2019-01-05 NOTE — Progress Notes (Signed)
[facility]   MULTIDISCIPLINARY HEART VALVE CLINIC                                       Cardiology Office Note    Date:  01/06/2019   ID:  John Mcbride, DOB 11-Apr-1965, MRN [id number]  PCP:  Georgann Housekeeper, MD  Cardiologist:  Dr. Excell Seltzer  CC: 1 year s/p PFO closure   History of Present Illness:  John Mcbride is a 53 y.o. male with a history of cryptogenic CVA and PFO s/p PFO closure (12/04/17) who presents to clinic for follow up.   He presented with severe vertigo, nausea, vomiting, and lethargy on 11-12-2017 and was diagnosed with an acute cerebellar stroke. He was treated conservatively and required [facility]. Stroke work up was unremarkable except for PFO was discovered on TEE.   He underwent successful transcatheter PFO closure with a 25 mm Amplatzer PFO occluder device on 12/04/17. Post op echo showed a well seated device with no interatrial flow. He was discharged on ASA and plavix x 3 months.  Today he presents to clinic for follow up. No CP or SOB. No LE edema, orthopnea or PND. No dizziness or syncope. No blood in stool or urine. No palpitations. No new neurologic symptoms. Just has had some knee pain.    Past Medical History:  Diagnosis Date  . Heart murmur   . Stroke [facility])     Past Surgical History:  Procedure Laterality Date  . PATENT FORAMEN OVALE(PFO) CLOSURE N/A 12/04/2017   Procedure: PATENT FORAMEN OVALE (PFO) CLOSURE;  Surgeon: [name], MD;  Location[facility]VASIVE CV LAB;  Service: Cardiovascular;  Laterality: N/A;  . TEE WITHOUT CARDIOVERSION N/A 11/14/2017   Procedure: TRANSESOPHAGEAL ECHOCARDIOGRAM (TEE);  Surgeon: Pricilla Riffle, MD;  Location: [facility] ENDOSCOPY;  Service: Cardiovascular;  Laterality: N/A;  bubble study    Current Medications: Outpatient Medications Prior to Visit  Medication Sig Dispense Refill  . aspirin EC 81 MG tablet Take 81 mg by mouth daily.    Marland Kitchen atorvastatin (LIPITOR) 20 MG tablet Take 1  tablet (20 mg total) by mouth daily at 6 PM. 30 tablet 0   No facility-administered medications prior to visit.      Allergies:   Patient has no known allergies.   Social History   Socioeconomic History  . Marital status: Single    Spouse name: Not on file  . Number of children: Not on file  . Years of education: Not on file  . Highest education level: Not on file  Occupational History  . 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 Financial resource strain: Not hard at all  . Food insecurity    Worry: Never true    Inability: Never true  . Transportation needs    Medical: No    Non-medical: No  Tobacco Use  . Smoking status: Never Smoker  . Smokeless tobacco: Never Used  Substance and Sexual Activity  . Alcohol use: Yes    Alcohol/week: 10.0 standard drinks    Types: 10 Standard drinks or equivalent per week    Comment: 2 beers weekly on a average  . Drug use: No  . Sexual activity: Not on file  Lifestyle  . Physical activity    Days per week: 0 days    Minutes per session: 0 min  .  Stress: Not at all  Relationships  . Social connections    Talks on phone: More than three times a week    Gets together: More than three times a week    Attends religious service: Patient refused    Active member of club or organization: Patient refused    Attends meetings of clubs or organizations: Patient refused    Relationship status: Patient refused  Other Topics Concern  . Not on file  Social History Narrative  . Not on file     Family History:  The patient's family history includes Cancer in his father and mother; Heart disease in his father.     ROS:   Please see the history of present illness.    ROS All other systems reviewed and are negative.   PHYSICAL EXAM:   VS:  BP 138/70   Pulse 77   Ht 5\' 10"  (1.778 m)   Wt 219 lb (99.3 kg)   SpO2 98%   BMI 31.42 kg/m    GEN: Well nourished, well developed, in no acute distress HEENT: normal  Neck: no JVD or masses Cardiac: RRR; no murmurs, rubs, or gallops,no edema  Respiratory:  clear to auscultation bilaterally, normal work of breathing GI: soft, nontender, nondistended, + BS MS: no deformity or atrophy Skin: warm and dry, no rash Neuro:  Alert and Oriented x 3, Strength and sensation are intact Psych: euthymic mood, full affect   Wt Readings from Last 3 Encounters:  01/06/19 219 lb (99.3 kg)  11/03/18 216 lb 3.2 oz (98.1 kg)  04/09/18 235 lb (106.6 kg)      Studies/Labs Reviewed:   EKG:  EKG is ordered today.  The ekg ordered today demonstrates NSR HR 77 bpm  Recent Labs: No results found for requested labs within last 8760 hours.   Lipid Panel    Component Value Date/Time   CHOL 139 11/13/2017 0415   TRIG 101 11/13/2017 0415   HDL 34 (L) 11/13/2017 0415   CHOLHDL 4.1 11/13/2017 0415   VLDL 20 11/13/2017 0415   LDLCALC 85 11/13/2017 0415    Additional studies/ records that were reviewed today include:  12/04/17 PATENT FORAMEN OVALE (PFO) CLOSURE  Conclusion Successful transcatheter PFO closure under fluoroscopic and intracardiac echo guidance - 25 mm Amplatzer PFO occluder  Recommend uninterrupted dual antiplatelet therapy with Aspirin 81mg  daily and Clopidogrel 75mg  dailyfor a minimum of 3 months.   _________________  Echo 12/04/17 Study Conclusions - Left ventricle: The cavity size was normal. Systolic function was normal. The estimated ejection fraction was in the range of 55% to 60%. Wall motion was normal; there were no regional wall motion abnormalities. Doppler parameters are consistent with abnormal left ventricular relaxation (grade 1 diastolic dysfunction). There was no evidence of elevated ventricular filling pressure by Doppler parameters. - Aortic valve: There was no regurgitation. - Mitral valve: There was trivial regurgitation. - Right ventricle: Systolic function was normal. - Tricuspid valve: There was no  regurgitation. - Pulmonary arteries: Systolic pressure could not be accurately estimated. - Inferior vena cava: The vessel was normal in size. - Pericardium, extracardiac: There was no pericardial effusion. Impressions: - An Amplatzer occluder device is seen in the interatrial septum with no residual flow.  _________________  Echo 01/06/19 IMPRESSIONS  1. Left ventricular ejection fraction, by visual estimation, is 55 to 60%. The left ventricle has normal function. There is no left ventricular hypertrophy.  2. Global right ventricle has normal systolic function.The right ventricular size is normal.  No increase in right ventricular wall thickness.  3. Left atrial size was normal.  4. Right atrial size was normal.  5. The mitral valve is normal in structure. Trace mitral valve regurgitation.  6. The tricuspid valve is grossly normal. Tricuspid valve regurgitation is trivial.  7. The aortic valve is normal in structure. Aortic valve regurgitation was not visualized by color flow Doppler.  8. The pulmonic valve was grossly normal. Pulmonic valve regurgitation is not visualized by color flow Doppler.  9. Mildly elevated pulmonary artery systolic pressure.  Shunts: Agitated saline contrast was given intravenously to evaluate for intracardiac shunting. No atrial level shunt detected by color flow Doppler.  ASSESSMENT & PLAN:   PFO s/p PFO closure: on aspirin. No neuro symptoms. Echo today showed EF 55-60% with no atrial level shunt by bubble study. Can follow with structural heart as needed.   Cryptogenic CVA: s/p PFO closure as above. Continue ASA and statin    Medication Adjustments/Labs and Tests Ordered: Current medicines are reviewed at length with the patient today.  Concerns regarding medicines are outlined above.  Medication changes, Labs and Tests ordered today are listed in the Patient Instructions below. Patient Instructions  Please call us if you need anything!     Signed, Cline CrockKathryn Thompson, PA-C  01/06/2019 4:42 PM    [facility] [address], [place], [place]  [address] Phone: [phone]; Fax: [phone]

## 2019-01-06 ENCOUNTER — Other Ambulatory Visit: Payer: Self-pay

## 2019-01-06 ENCOUNTER — Encounter: Payer: Self-pay | Admitting: Physician Assistant

## 2019-01-06 ENCOUNTER — Ambulatory Visit (INDEPENDENT_AMBULATORY_CARE_PROVIDER_SITE_OTHER): Payer: BC Managed Care – PPO | Admitting: Physician Assistant

## 2019-01-06 ENCOUNTER — Ambulatory Visit (HOSPITAL_COMMUNITY): Payer: BC Managed Care – PPO | Attending: Physician Assistant

## 2019-01-06 ENCOUNTER — Encounter: Payer: BLUE CROSS/BLUE SHIELD | Admitting: *Deleted

## 2019-01-06 VITALS — BP 138/70 | HR 77 | Ht 70.0 in | Wt 219.0 lb

## 2019-01-06 DIAGNOSIS — Z006 Encounter for examination for normal comparison and control in clinical research program: Secondary | ICD-10-CM

## 2019-01-06 DIAGNOSIS — Z8774 Personal history of (corrected) congenital malformations of heart and circulatory system: Secondary | ICD-10-CM

## 2019-01-06 DIAGNOSIS — Z8673 Personal history of transient ischemic attack (TIA), and cerebral infarction without residual deficits: Secondary | ICD-10-CM

## 2019-01-06 MED ORDER — SODIUM CHLORIDE 0.9% FLUSH
16.0000 mL | INTRAVENOUS | Status: AC | PRN
  Administered 2019-01-06: 16 mL

## 2019-01-06 NOTE — Patient Instructions (Signed)
Please call us if you need anything.

## 2019-03-11 NOTE — Research (Signed)
PFO 12 MONTH OFFICE VISIT LATE ENTRY    Patient seen at 12 month visit.Bubble study completed. Will now be in phone call follow up.

## 2020-01-05 DIAGNOSIS — L03012 Cellulitis of left finger: Secondary | ICD-10-CM | POA: Diagnosis not present

## 2020-02-14 IMAGING — CT CT ANGIO HEAD
1 of 12 series · 6 of 33 positions shown · IV contrast (iopamidol)
Comparison: CT and MR study same day.

CLINICAL DATA: Acute left cerebellar infarction. Dizziness, nausea
and vomiting.

EXAM:
CT ANGIOGRAPHY HEAD AND NECK
TECHNIQUE: Multidetector CT imaging of the head and neck was performed using
the standard protocol during bolus administration of intravenous
contrast. Multiplanar CT image reconstructions and MIPs were
obtained to evaluate the vascular anatomy. Carotid stenosis
measurements (when applicable) are obtained utilizing NASCET
criteria, using the distal internal carotid diameter as the
denominator.
CONTRAST:  80mL [id number] IOPAMIDOL ([id number]) INJECTION 76%

[Series 7: cta head neck thins · axial · 0.39mm/px · z∈[-275,-22]mm · 6 of 708 slices shown]
[im 102/708  soft-tissue]
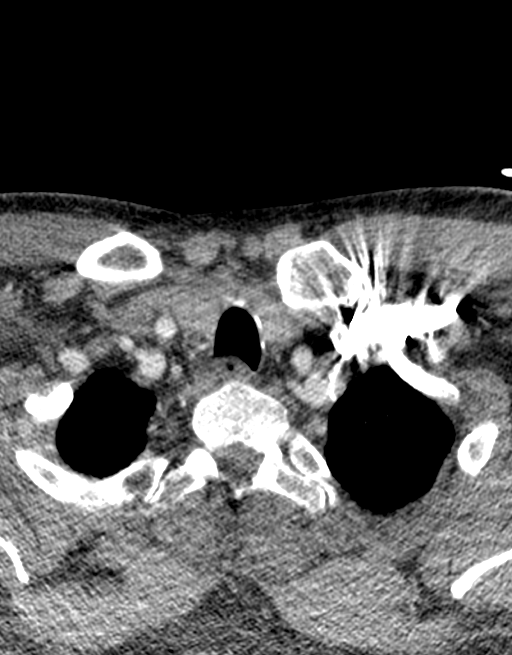
[im 203/708  bone]
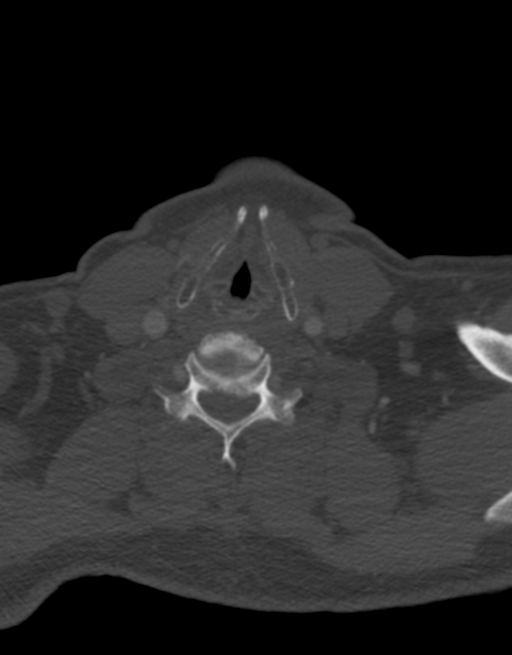
[im 304/708  soft-tissue]
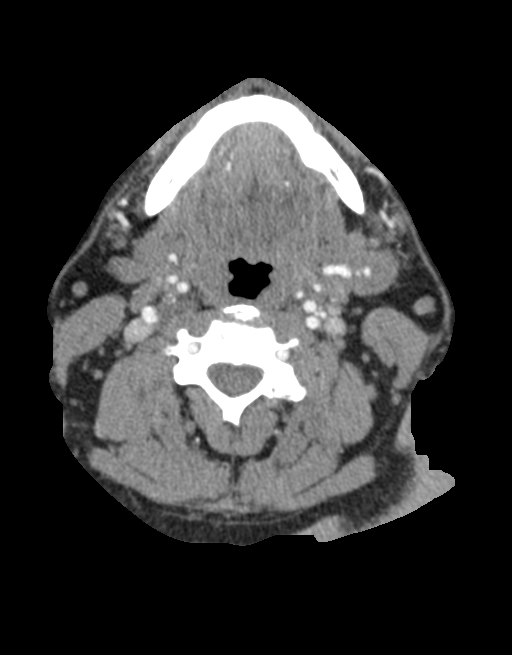
[im 405/708  bone]
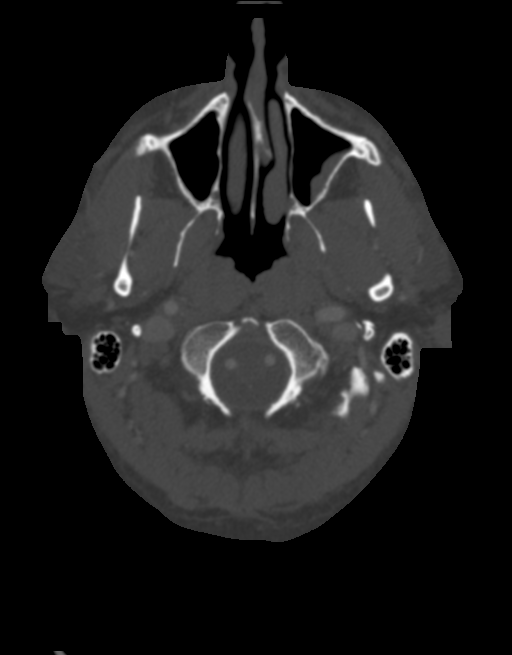
[im 506/708  soft-tissue]
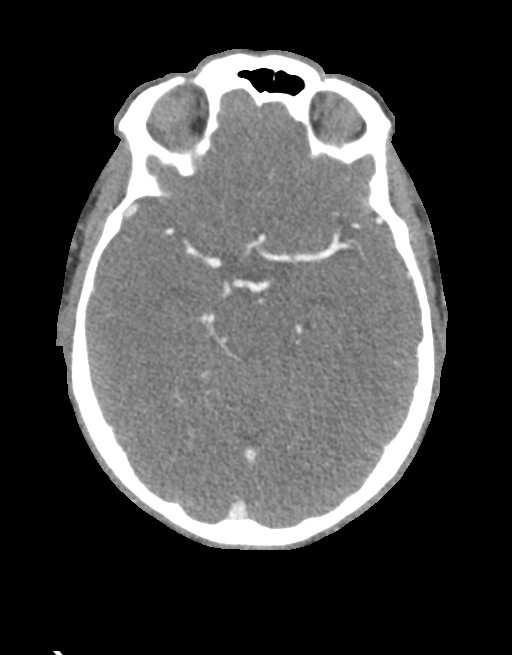
[im 607/708  bone]
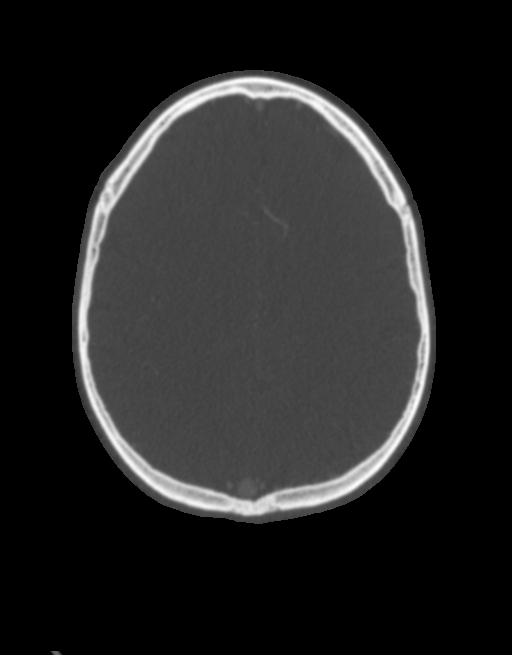

[6 of 33 positions shown; findings below may reference images not displayed]

FINDINGS: CTA NECK FINDINGS

Aortic arch: Normal

Right carotid system: Common carotid artery widely patent to the
bifurcation. The carotid bifurcation is normal without soft or
calcified plaque. Cervical internal carotid artery is tortuous but
widely patent.

Left carotid system: Common carotid artery widely patent to the
bifurcation. Carotid bifurcation is normal without soft or calcified
plaque. Cervical ICA is tortuous but widely patent.

Vertebral arteries: Both subclavian arteries appear normal. Both
vertebral artery origins are widely patent. The vertebral arteries
are approximately equal in size an widely patent through the
cervical region to the foramen magnum.

Skeleton: Ordinary cervical spondylosis.

Other neck: No mass or lymphadenopathy.

Upper chest: Normal

Review of the MIP images confirms the above findings

CTA HEAD FINDINGS

Anterior circulation: Both internal carotid arteries are widely
patent through the skull base and siphon regions. The anterior and
middle cerebral vessels are patent without proximal stenosis,
aneurysm or vascular malformation.

Posterior circulation: Both vertebral arteries are widely patent at
and through the foramen magnum to the basilar. No basilar stenosis.
There are diminutive posterior inferior cerebellar arteries. There
are bilateral anterior inferior cerebellar arteries which are
patent. Right superior cerebellar artery is normal. Left superior
cerebellar artery is occluded just beyond its origin. Both posterior
inferior cerebellar arteries are patent and normal.

Venous sinuses: Patent and normal.

Anatomic variants: None significant.

Delayed phase: No abnormal enhancement.

Review of the MIP images confirms the above findings
IMPRESSION: The patient does not show evidence of atherosclerotic vascular
disease in general or of dissection. However, there is occlusion of
the left superior cerebellar artery just beyond its origin.

## 2020-02-14 IMAGING — MR MR HEAD W/O CM
9 of 10 series · 37 of 48 positions shown · non-contrast
Comparison: CT same day

CLINICAL DATA: Acute presentation with dizziness, nausea and
vomiting. Hydraulic fluid in the ear at work.

EXAM:
MRI HEAD WITHOUT CONTRAST
TECHNIQUE: Multiplanar, multiecho pulse sequences of the brain and surrounding
structures were obtained without intravenous contrast.

[Series 3: DWI · axial · 3.0mm · 1.09mm/px · z∈[-3,+150]mm · 8 of 104 slices shown (1 of 4)]
[im 1/104]
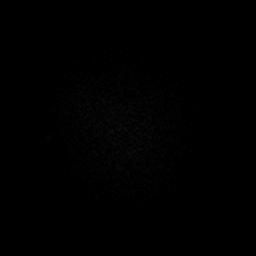
[im 12/104]
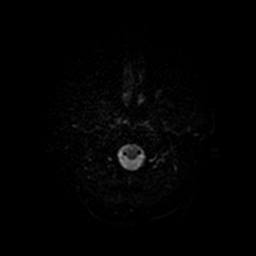
[im 35/104]
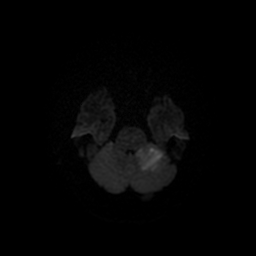
[im 46/104]
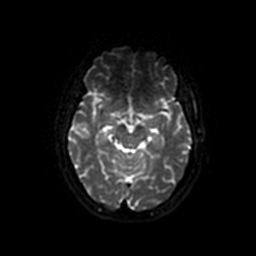
[im 58/104]
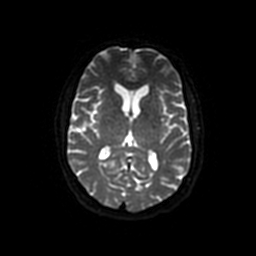
[im 69/104]
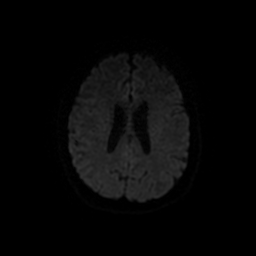
[im 92/104]
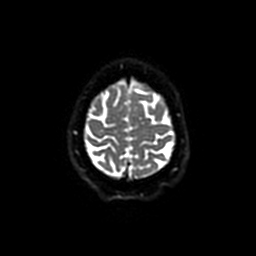
[im 104/104]
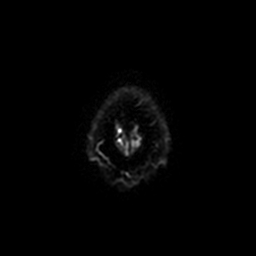

[Series 4: T1 · sagittal · 5.0mm · 0.47mm/px · 2 of 24 slices shown]
[im 1/24]
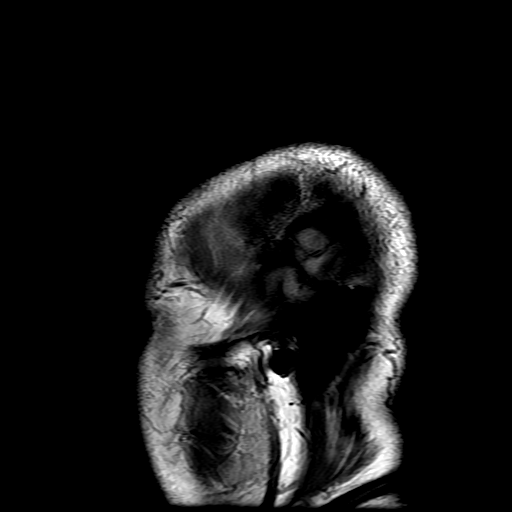
[im 24/24]
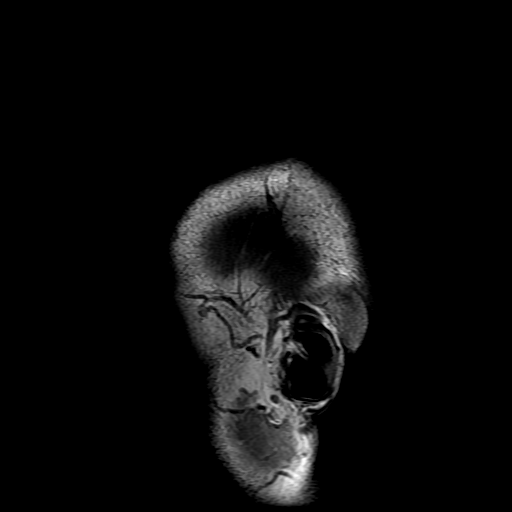

[Series 5: DWI · coronal · 3.0mm · 1.09mm/px · 8 of 104 slices shown (2 of 4)]
[im 1/104]
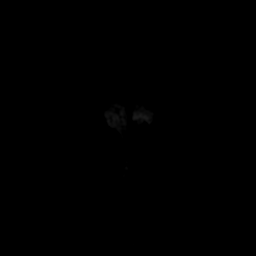
[im 12/104]
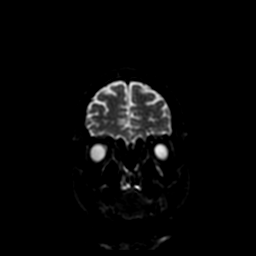
[im 35/104]
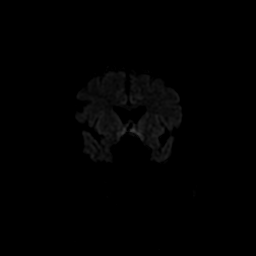
[im 46/104]
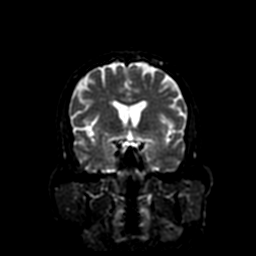
[im 58/104]
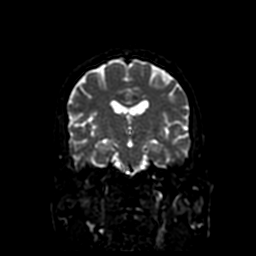
[im 69/104]
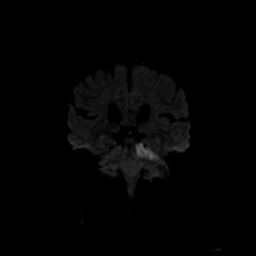
[im 92/104]
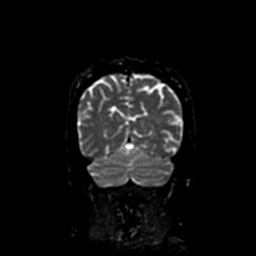
[im 104/104]
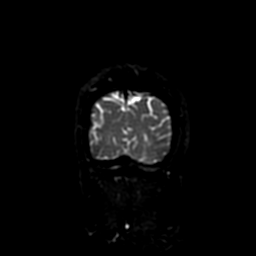

[Series 6: T2 · axial · 5.0mm · 0.43mm/px · z∈[-27,+133]mm · 3 of 28 slices shown (1 of 2)]
[im 1/28]
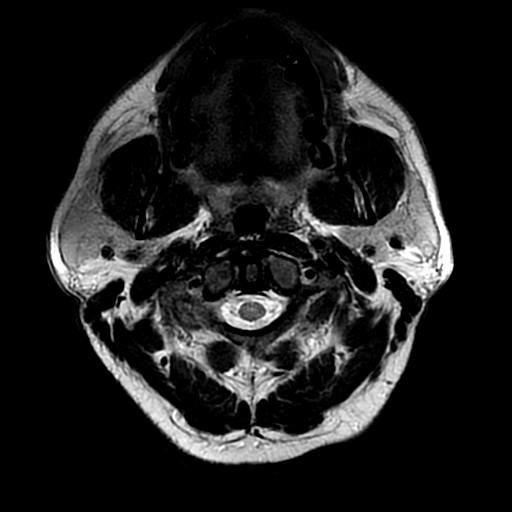
[im 14/28]
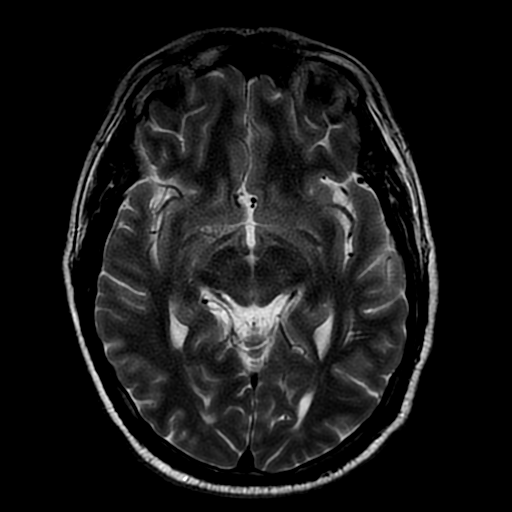
[im 28/28]
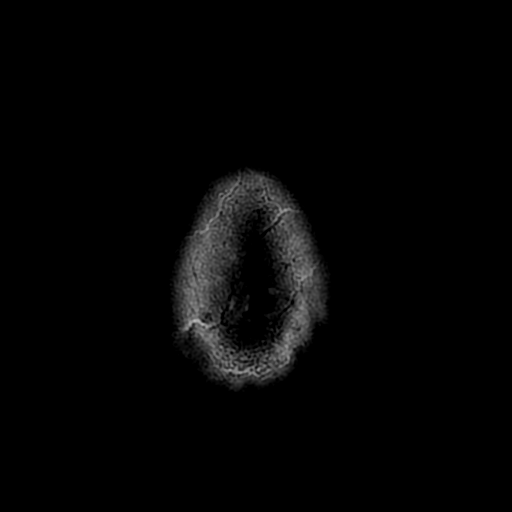

[Series 7: FLAIR · axial · 5.0mm · 0.43mm/px · z∈[-27,+133]mm · 3 of 28 slices shown]
[im 1/28]
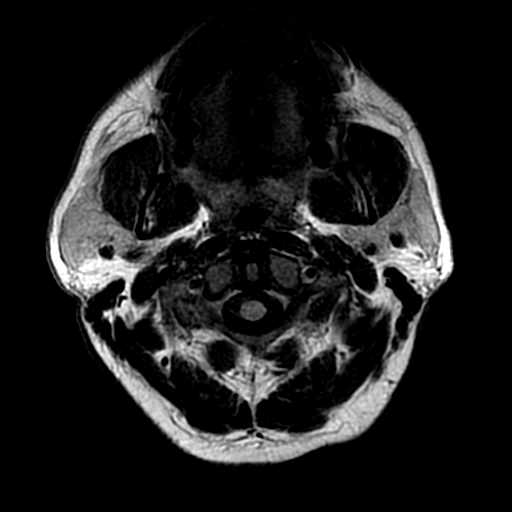
[im 14/28]
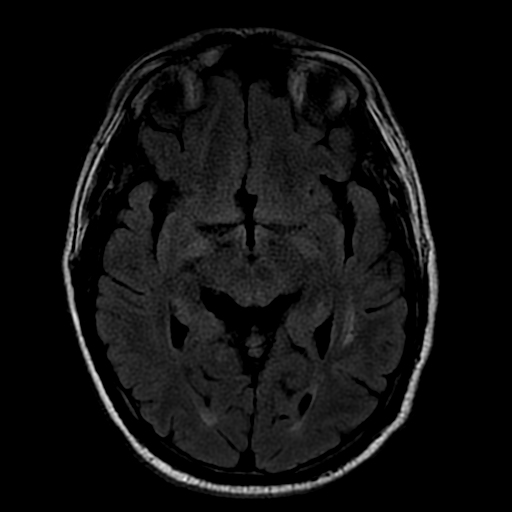
[im 28/28]
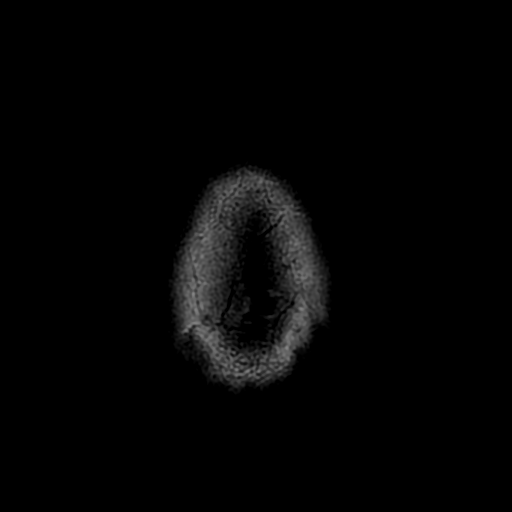

[Series 8: ax mpgr · axial · 5.0mm · 0.45mm/px · 1 of 24 slices shown]
[im 1/24]
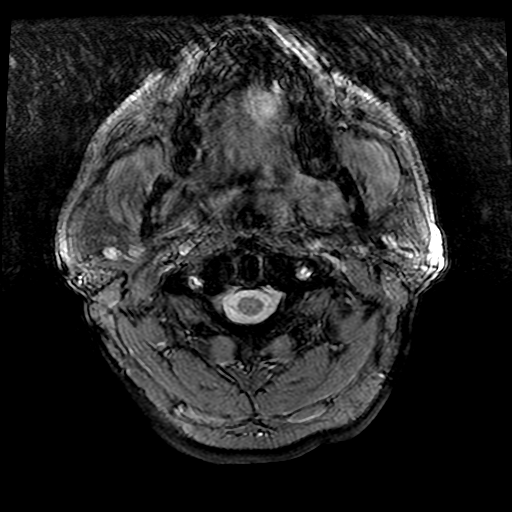

[Series 10: T2 · coronal · 5.0mm · 0.47mm/px · 2 of 24 slices shown (2 of 2)]
[im 1/24]
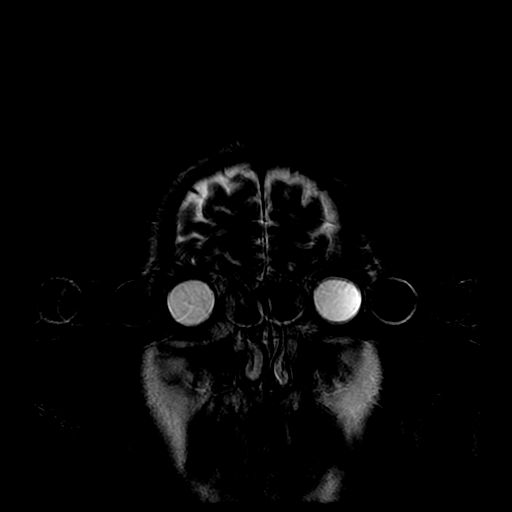
[im 24/24]
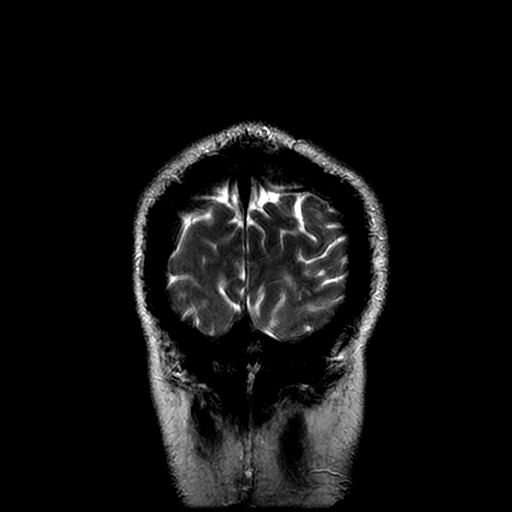

[Series 300: DWI · axial · 3.0mm · 1.09mm/px · z∈[-3,+150]mm · 5 of 52 slices shown (3 of 4)]
[im 1/52]
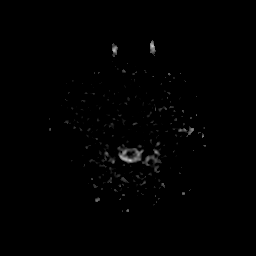
[im 13/52]
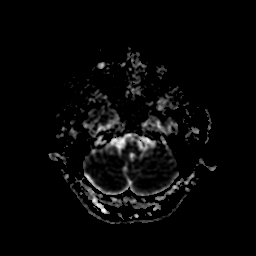
[im 26/52]
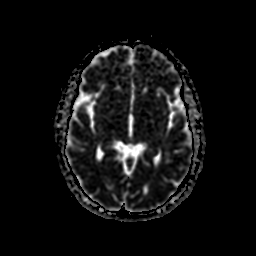
[im 39/52]
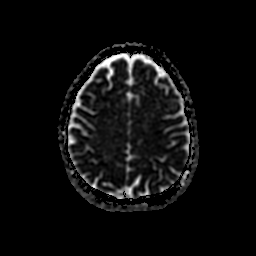
[im 52/52]
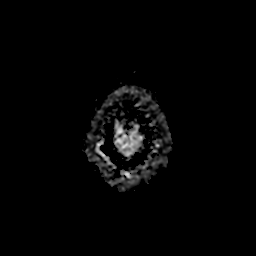

[Series 500: DWI · coronal · 3.0mm · 1.09mm/px · 5 of 52 slices shown (4 of 4)]
[im 1/52]
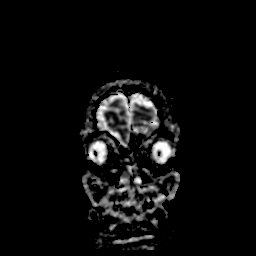
[im 13/52]
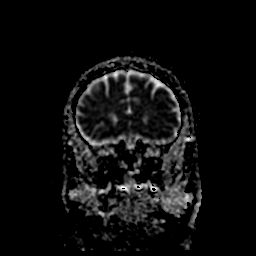
[im 26/52]
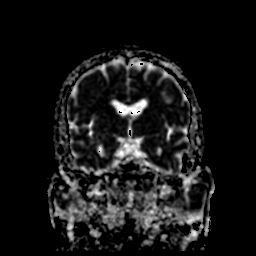
[im 39/52]
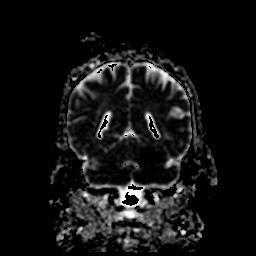
[im 52/52]
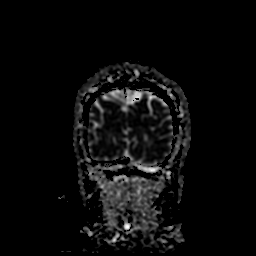

[37 of 48 positions shown; findings below may reference images not displayed]

FINDINGS: Brain: 3.5 cm in diameter acute infarction affecting the superior
cerebellum on the left. No other acute infarction. No significant
mass effect. No sign of hemorrhage. No abnormal finding affecting
the brainstem. Cerebral hemispheres show scattered foci of T2 and
FLAIR signal in the deep white matter consistent with early small
vessel ischemic change of a chronic nature. No mass lesion,
hydrocephalus or extra-axial collection.

Vascular: Major vessels at the base of the brain show flow.

Skull and upper cervical spine: Normal

Sinuses/Orbits: Minimal mucosal inflammatory changes of the left
maxillary sinus.

Other: None
IMPRESSION: 3.5 cm in diameter acute infarction in the superior cerebellum on
the left. No evidence of significant swelling/mass effect or of any
hemorrhage.

Mild chronic small-vessel ischemic change of the cerebral
hemispheric white matter.

No large vessel vascular occlusion identified on this noncontrast
study.

## 2020-02-14 IMAGING — CT CT HEAD W/O CM
3 series · 15 of 47 positions shown, 18 images · non-contrast
Comparison: None.

CLINICAL DATA: Dizziness. Nausea and vomiting. Hydraulic fluid in
the ear at work.

EXAM:
CT HEAD WITHOUT CONTRAST
TECHNIQUE: Contiguous axial images were obtained from the base of the skull
through the vertex without intravenous contrast.

[Series 2: head wo · axial · 0.47mm/px · z∈[-135,-5]mm · 9 of 32 slices shown, 12 images]
[im 3/32  brain]
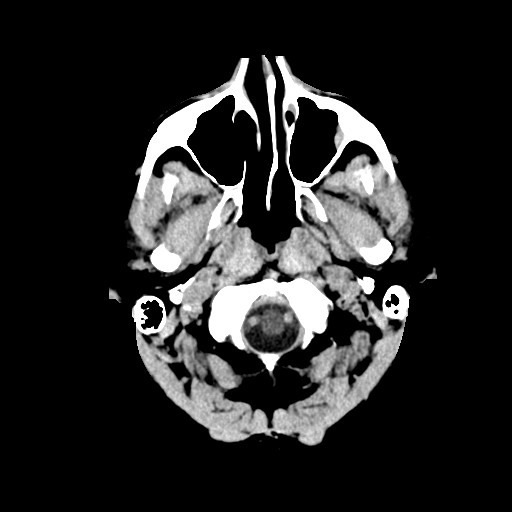
[im 3/32  bone]
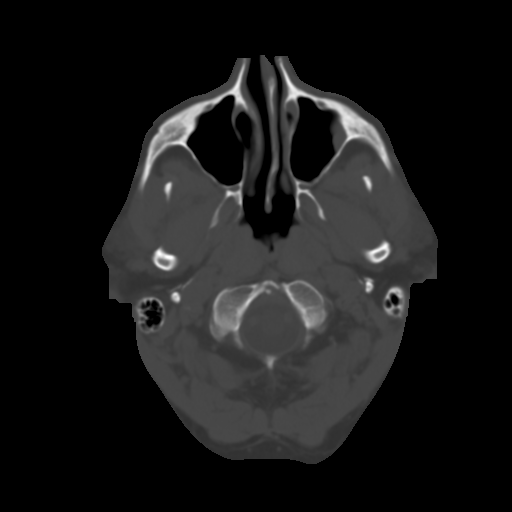
[im 6/32  brain]
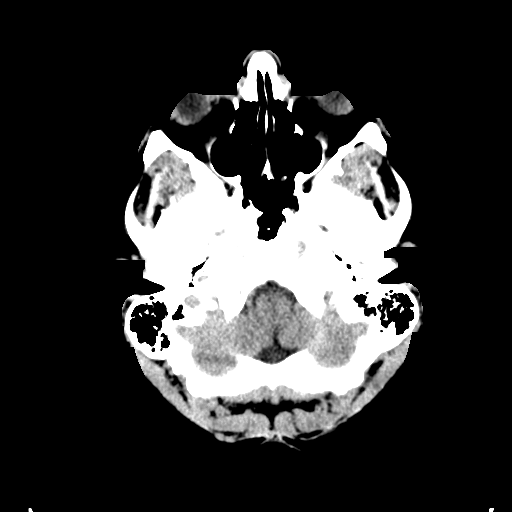
[im 9/32  brain]
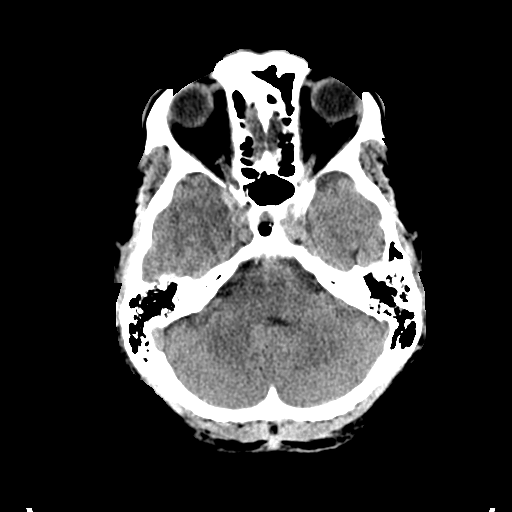
[im 12/32  brain]
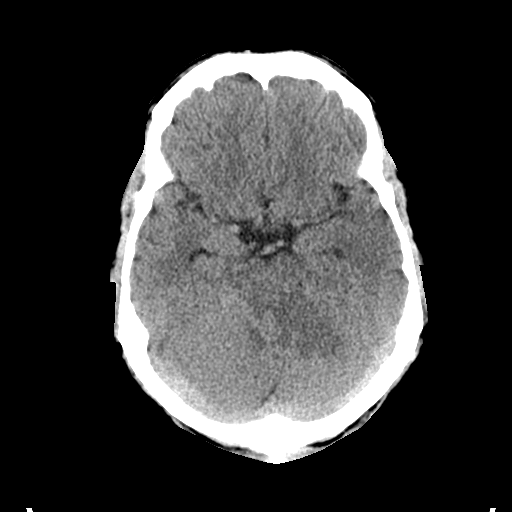
[im 17/32  brain]
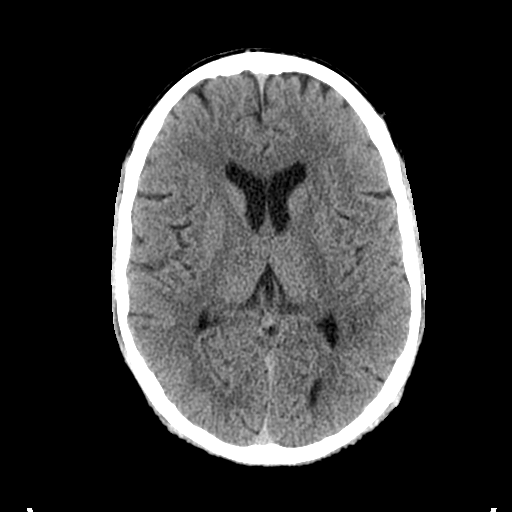
[im 17/32  bone]
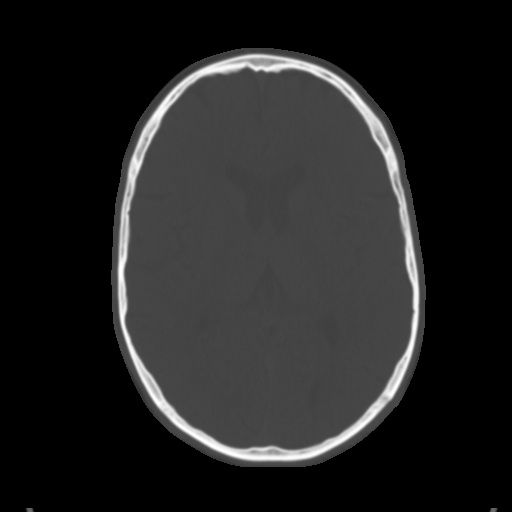
[im 20/32  brain]
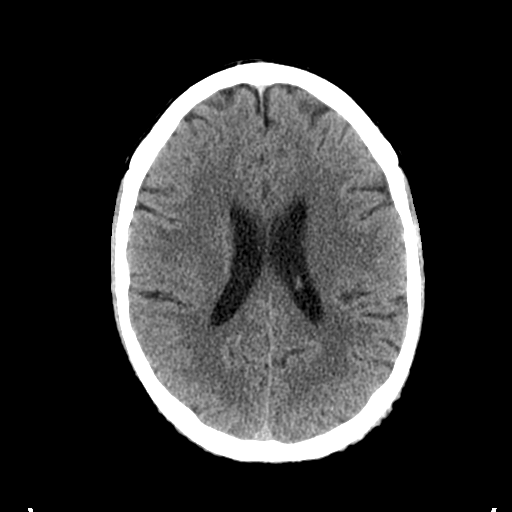
[im 23/32  brain]
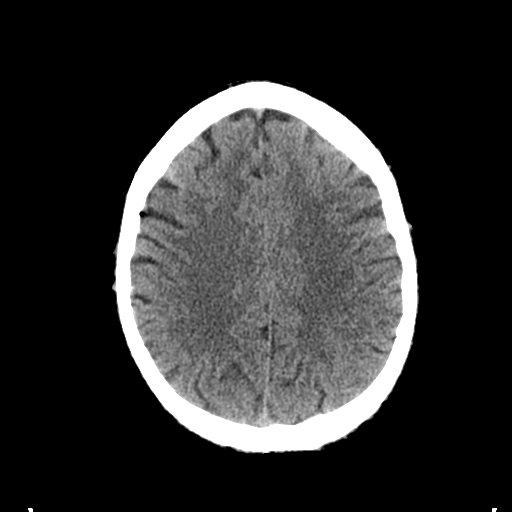
[im 26/32  brain]
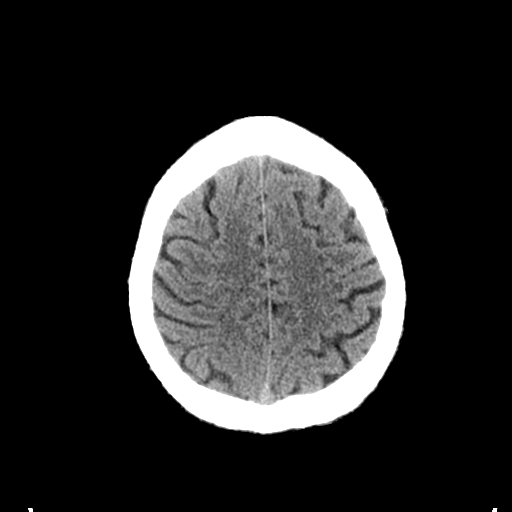
[im 29/32  brain]
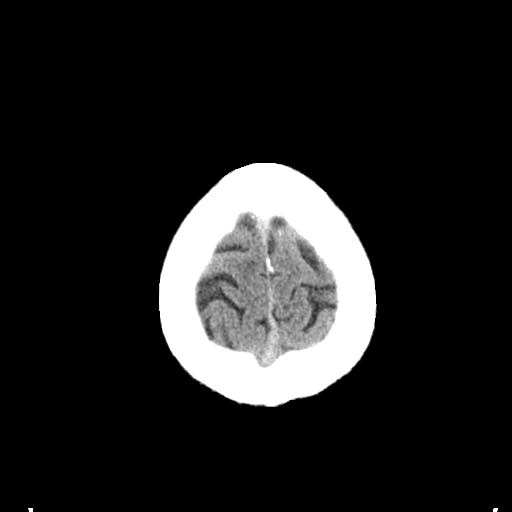
[im 29/32  bone]
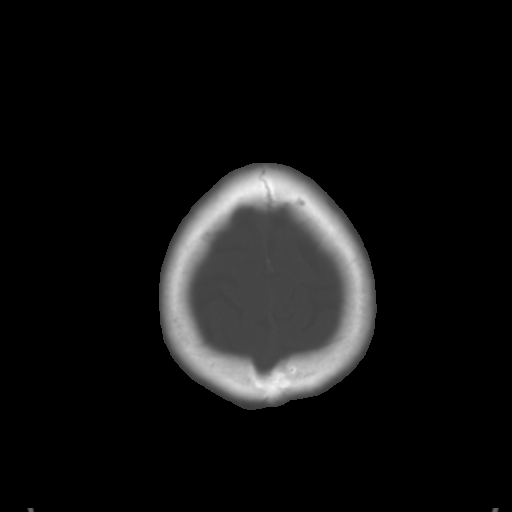

[Series 4: coronal soft tissue · coronal · 0.31mm/px · 3 of 66 slices shown]
[im 22/66  brain]
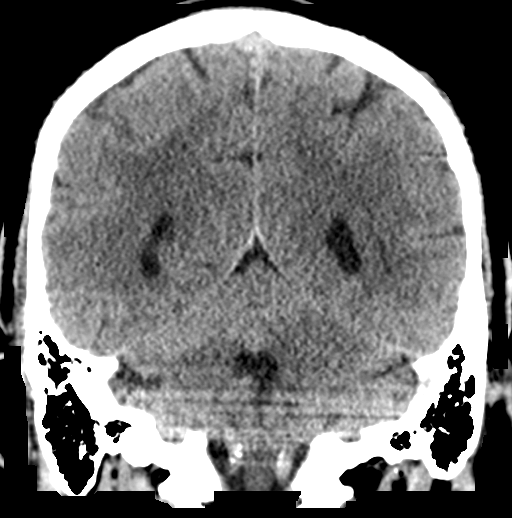
[im 29/66  brain]
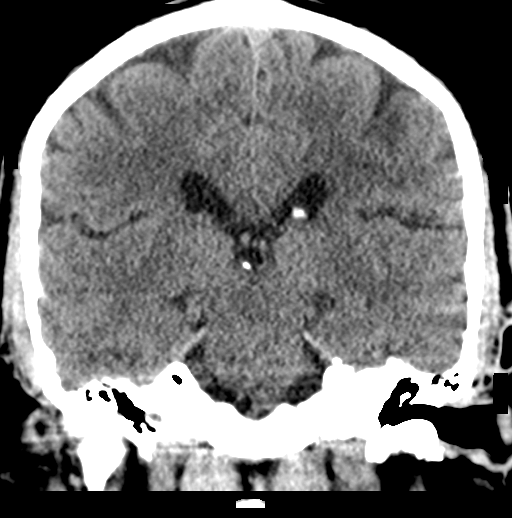
[im 37/66  brain]
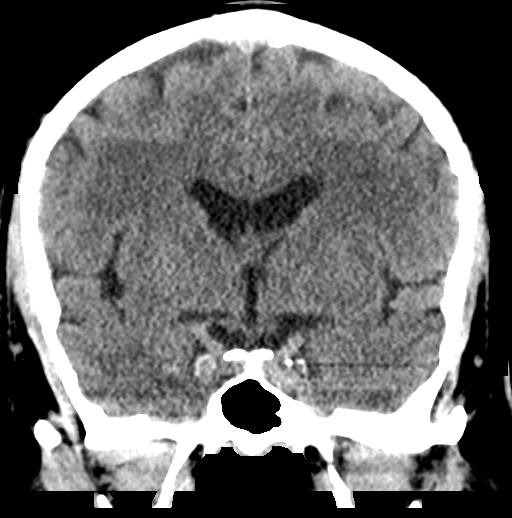

[Series 5: sagittal soft tissue · sagittal · 0.31mm/px · 3 of 51 slices shown]
[im 17/51  brain]
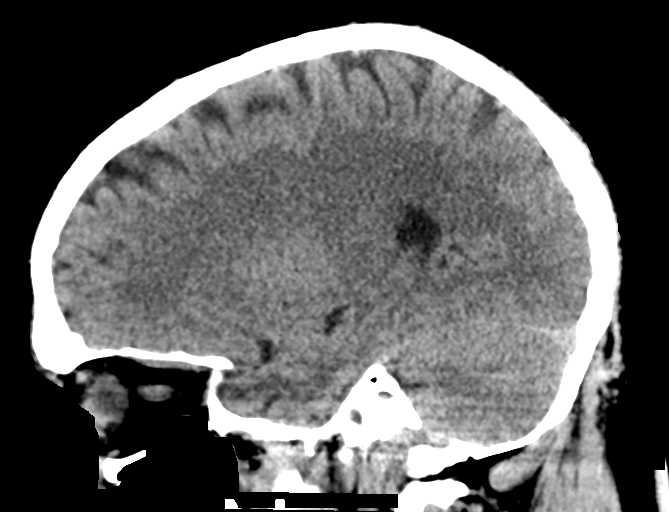
[im 26/51  brain]
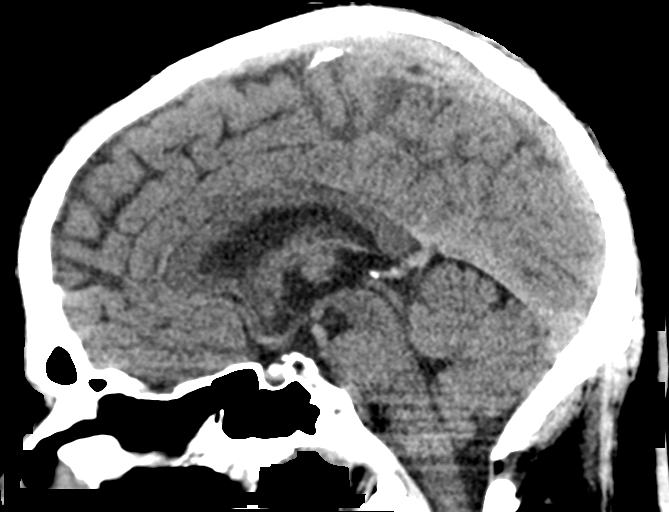
[im 34/51  brain]
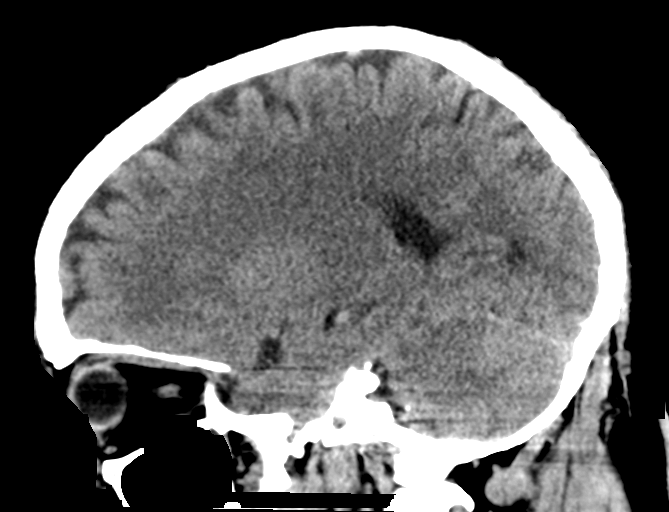

[15 of 47 positions shown; findings below may reference images not displayed]

FINDINGS: Brain: There is suggestion of asymmetrical low-attenuation and
possible mass effect in the left anterior cerebellum and cerebellar
peduncle. This could represent an area of acute infarct or possibly
a mass lesion. MRI is suggested for further evaluation. Intracranial
contents are otherwise unremarkable. No ventricular dilatation. No
abnormal extra-axial fluid collections. Gray-white matter junctions
are distinct. Basal cisterns are not effaced. No acute intracranial
hemorrhage.

Vascular: No hyperdense vessel or unexpected calcification.

Skull: Normal. Negative for fracture or focal lesion.

Sinuses/Orbits: Mild mucosal thickening in the paranasal sinuses. No
acute air-fluid levels. Mastoid air cells and middle ear cavities
are clear.

Other: None.
IMPRESSION: Suggestion of asymmetrical low-attenuation and possible mass effect
in the left anterior cerebellum and cerebellar peduncle. MRI is
recommended for further evaluation. No acute intracranial hemorrhage
or mass effect.

## 2020-10-20 ENCOUNTER — Encounter: Payer: Self-pay | Admitting: *Deleted

## 2020-10-20 DIAGNOSIS — Z006 Encounter for examination for normal comparison and control in clinical research program: Secondary | ICD-10-CM

## 2020-10-20 NOTE — Research (Signed)
3 year Phone call Follow up  Called patient for 3 year phone call follow up. States he is doing well and having no problems at all. Will call him next year for 4 year phone call     [place] Malene Blaydes, Research Assistant  10/20/2020  13:59 p.m.

## 2022-01-23 ENCOUNTER — Encounter: Payer: Self-pay | Admitting: *Deleted

## 2022-01-23 ENCOUNTER — Telehealth: Payer: Self-pay | Admitting: *Deleted

## 2022-01-23 DIAGNOSIS — Z006 Encounter for examination for normal comparison and control in clinical research program: Secondary | ICD-10-CM

## 2022-01-23 NOTE — Telephone Encounter (Signed)
Patient returned call as I was leaving message and had already started phone note

## 2022-01-23 NOTE — Research (Signed)
Called patient for his 4 year phone call follow up for PFO study. Patient said he has been doing well and having no problems at all. Told him to call us if he needed anything will call him next year for his final call for the study.     [place] Jomarie Gellis, Research Coordinator  01/23/2022 11:07 a.m.

## 2023-02-14 ENCOUNTER — Encounter: Payer: Self-pay | Admitting: *Deleted

## 2023-02-14 DIAGNOSIS — Z006 Encounter for examination for normal comparison and control in clinical research program: Secondary | ICD-10-CM
